# Patient Record
Sex: Female | Born: 1960 | Race: White | Hispanic: No | Marital: Married | Smoking: Former smoker
Health system: Southern US, Community
[De-identification: ages and names within clinical notes are randomized; demographics above are authoritative.]

## PROBLEM LIST (undated history)

## (undated) DIAGNOSIS — H269 Unspecified cataract: Secondary | ICD-10-CM

## (undated) DIAGNOSIS — F419 Anxiety disorder, unspecified: Secondary | ICD-10-CM

## (undated) DIAGNOSIS — R51 Headache: Secondary | ICD-10-CM

## (undated) DIAGNOSIS — G709 Myoneural disorder, unspecified: Secondary | ICD-10-CM

## (undated) DIAGNOSIS — F329 Major depressive disorder, single episode, unspecified: Secondary | ICD-10-CM

## (undated) DIAGNOSIS — E119 Type 2 diabetes mellitus without complications: Secondary | ICD-10-CM

## (undated) DIAGNOSIS — E785 Hyperlipidemia, unspecified: Secondary | ICD-10-CM

## (undated) DIAGNOSIS — B019 Varicella without complication: Secondary | ICD-10-CM

## (undated) DIAGNOSIS — I10 Essential (primary) hypertension: Secondary | ICD-10-CM

## (undated) DIAGNOSIS — M1990 Unspecified osteoarthritis, unspecified site: Secondary | ICD-10-CM

## (undated) DIAGNOSIS — R519 Headache, unspecified: Secondary | ICD-10-CM

## (undated) DIAGNOSIS — F32A Depression, unspecified: Secondary | ICD-10-CM

## (undated) HISTORY — DX: Varicella without complication: B01.9

## (undated) HISTORY — DX: Depression, unspecified: F32.A

## (undated) HISTORY — DX: Myoneural disorder, unspecified: G70.9

## (undated) HISTORY — DX: Type 2 diabetes mellitus without complications: E11.9

## (undated) HISTORY — DX: Headache: R51

## (undated) HISTORY — DX: Headache, unspecified: R51.9

## (undated) HISTORY — DX: Major depressive disorder, single episode, unspecified: F32.9

## (undated) HISTORY — DX: Anxiety disorder, unspecified: F41.9

## (undated) HISTORY — PX: TONSILLECTOMY AND ADENOIDECTOMY: SUR1326

## (undated) HISTORY — DX: Essential (primary) hypertension: I10

## (undated) HISTORY — DX: Unspecified osteoarthritis, unspecified site: M19.90

## (undated) HISTORY — DX: Unspecified cataract: H26.9

## (undated) HISTORY — DX: Hyperlipidemia, unspecified: E78.5

## (undated) HISTORY — PX: TUBAL LIGATION: SHX77

---

## 2011-11-05 HISTORY — PX: ABDOMINAL HYSTERECTOMY: SHX81

## 2011-11-05 HISTORY — PX: BREAST BIOPSY: SHX20

## 2012-04-01 ENCOUNTER — Ambulatory Visit: Payer: Self-pay | Admitting: Unknown Physician Specialty

## 2012-05-06 ENCOUNTER — Ambulatory Visit: Payer: Self-pay | Admitting: Obstetrics & Gynecology

## 2012-05-06 LAB — BASIC METABOLIC PANEL
Anion Gap: 7 (ref 7–16)
Calcium, Total: 9.2 mg/dL (ref 8.5–10.1)
Co2: 27 mmol/L (ref 21–32)
Creatinine: 0.73 mg/dL (ref 0.60–1.30)
EGFR (African American): >60
EGFR (Non-African Amer.): >60
Osmolality: 274 (ref 275–301)
Sodium: 137 mmol/L (ref 136–145)

## 2012-05-06 LAB — CBC
MCH: 28.7 pg (ref 26.0–34.0)
MCHC: 32.7 g/dL (ref 32.0–36.0)
MCV: 88 fL (ref 80–100)
Platelet: 304 10*3/uL (ref 150–440)
RDW: 13.7 % (ref 11.5–14.5)
WBC: 11.7 10*3/uL — ABNORMAL HIGH (ref 3.6–11.0)

## 2012-05-21 ENCOUNTER — Ambulatory Visit: Payer: Self-pay | Admitting: Obstetrics & Gynecology

## 2012-05-21 LAB — HCG, QUANTITATIVE, PREGNANCY: Beta Hcg, Quant.: <1 m[IU]/mL — ABNORMAL LOW

## 2012-05-22 LAB — HEMOGLOBIN: HGB: 12.4 g/dL (ref 12.0–16.0)

## 2012-05-25 LAB — PATHOLOGY REPORT

## 2012-11-26 ENCOUNTER — Ambulatory Visit: Payer: Self-pay | Admitting: General Surgery

## 2015-02-21 NOTE — Op Note (Signed)
PATIENT NAME:  Brittany Pruitt, Brittany Pruitt MR#:  [id number] DATE OF BIRTH:  January 25, 1961  DATE OF PROCEDURE:  05/21/2012  PREOPERATIVE DIAGNOSES:  1. Menorrhagia and pelvic pain.  2. Family history of ovarian cancer.   POSTOPERATIVE DIAGNOSES:    1. Menorrhagia and pelvic pain.  2. Family history of ovarian cancer.   PROCEDURES PERFORMED:  1. Complete laparoscopic hysterectomy. 2. Bilateral salpingo-oophorectomy. 3. Lysis of adhesions.   SURGEON: Glean Salen, M.D.  ASSISTANT: Prentice Docker, M.D.   ANESTHESIA: General.   ESTIMATED BLOOD LOSS: 50 mL.  COMPLICATIONS: None.  FINDINGS: Enlarged fibroid uterus.  DISPOSITION: To recovery room stable.   TECHNIQUE: The patient is prepped and draped in the usual sterile fashion after adequate anesthesia is obtained in the dorsal lithotomy position. Foley catheter is inserted. A V-Care device is placed on the cervix and uterus for manipulation purposes.   Attention is then turned to the abdomen where a Veress needle is inserted through a 5 mm infraumbilical incision after Marcaine is used to anesthetize the skin. Veress needle placement is confirmed using hanging drop technique and the abdomen is insufflated with CO2 gas. A 5 mm long trocar is then placed through the umbilicus under direct visualization with the laparoscope with no injuries or bleeding noted. The patient is placed in Trendelenburg position. The above-mentioned findings are visualized.   An 11 mm trocar is placed through the right lower quadrant and a 5 mm trocar is placed through the left lower quadrant lateral to the inferior epigastric blood vessels with no injuries or bleeding noted. The uterus is grasped with a tenaculum and a 5 mm Harmonic scalpel is utilized to dissect the uterus and adnexa. A bipolar cautery device is used to cauterize the infundibulopelvic blood vessels and their ligaments along with the uterine arteries. Once the dissection is complete to the level of the  external os of the cervix, the V-Care device is seen tenting through the cervical vaginal junction. A circumferential incision is made with the Harmonic scalpel to completely amputate the cervix, uterus and adnexa. The uterus is then removed vaginally and a sponge is placed vaginally to maintain pneumoperitoneum.   The vaginal cuff is closed with a 0 Polysorb suture in an interrupted fashion using the Endo Stitch device. Examination visually and bimanually reveals excellent closure. The pelvic cavity is irrigated with aspiration of all fluid and excellent hemostasis is noted. There is no apparent injury to bowel, bladder, or ureter. The patient is leveled. Gas is expelled. Trocars are removed. The skin is closed with Dermabond and covered with bandages. The patient goes to the recovery room in stable condition having tolerated the procedure well. All sponge, instrument, and needle counts are correct.   ____________________________ R. Barnett Applebaum, MD rph:ap D: 05/21/2012 14:53:38 ET T: 05/21/2012 15:31:26 ET JOB#: [id number]  cc: Glean Salen, MD, <Dictator> Gae Dry MD ELECTRONICALLY SIGNED 05/21/2012 16:53

## 2015-05-25 ENCOUNTER — Ambulatory Visit (INDEPENDENT_AMBULATORY_CARE_PROVIDER_SITE_OTHER): Payer: 59 | Admitting: Internal Medicine

## 2015-05-25 ENCOUNTER — Encounter (INDEPENDENT_AMBULATORY_CARE_PROVIDER_SITE_OTHER): Payer: Self-pay

## 2015-05-25 ENCOUNTER — Encounter: Payer: Self-pay | Admitting: Internal Medicine

## 2015-05-25 VITALS — BP 120/88 | HR 74 | Temp 97.5°F | Ht 65.0 in | Wt 188.0 lb

## 2015-05-25 DIAGNOSIS — E1165 Type 2 diabetes mellitus with hyperglycemia: Secondary | ICD-10-CM | POA: Insufficient documentation

## 2015-05-25 DIAGNOSIS — Z23 Encounter for immunization: Secondary | ICD-10-CM | POA: Diagnosis not present

## 2015-05-25 DIAGNOSIS — F329 Major depressive disorder, single episode, unspecified: Secondary | ICD-10-CM | POA: Insufficient documentation

## 2015-05-25 DIAGNOSIS — F418 Other specified anxiety disorders: Secondary | ICD-10-CM | POA: Diagnosis not present

## 2015-05-25 DIAGNOSIS — F419 Anxiety disorder, unspecified: Secondary | ICD-10-CM

## 2015-05-25 DIAGNOSIS — J383 Other diseases of vocal cords: Secondary | ICD-10-CM

## 2015-05-25 DIAGNOSIS — E119 Type 2 diabetes mellitus without complications: Secondary | ICD-10-CM | POA: Insufficient documentation

## 2015-05-25 DIAGNOSIS — F32A Depression, unspecified: Secondary | ICD-10-CM

## 2015-05-25 NOTE — Addendum Note (Signed)
Addended by: Lurlean Nanny on: 05/25/2015 01:40 PM   Modules accepted: Orders

## 2015-05-25 NOTE — Assessment & Plan Note (Signed)
Will check A1C today No microalbumin while she is on ACEI Discussed consuming a low carb diet and working on exercise Encouraged her to get a eye exam Foot exam today Flu UTD Prevnar today

## 2015-05-25 NOTE — Assessment & Plan Note (Signed)
She is not interested in seeing speech therapy again at this time She will continue Clonazepam to help relax the area

## 2015-05-25 NOTE — Assessment & Plan Note (Signed)
Anxiety controlled with daily Clonazepam No reoccurrence of depression

## 2015-05-25 NOTE — Progress Notes (Signed)
HPI  Pt presents to the clinic today to establish care and for management of the conditions listed below. She is transferring care from Dr. Dear at [facility].  DM 2: Fasting blood sugars 230-250. Non fasting can be as high as 350. She does try to consume a low carb diet. She reports she has lost 60 lbs in the last 5 years. She dies not exercise. Her last A1C was checked 6 months ago and it was greater than 10%. She is taking Glipizide and Januvia. She has taken Metformin in the past but had explosive diarrhea. She is taking Lisinopril for renal protection but has never been told she had high blood pressure. Her last eye exam was > 2 years ago. Flu 08/2014. Pneumovax never. Prevnar never.  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 She has tried Botox with a specialist at [facility]. They did not seem to work. She takes the Clonazepam once daily to help with this. She reports that she also has some anxiety which it helps with. She has had depression in the past but no reoccurrence. She denies SI/HI.  Flu: 08/2014 Tetanus: > 10 years ago Prevnar: never Pneumovax: never Mammogram: 2015 at [place] Pap Smear: 2013, total hysterectomy Colon Screening: never Vision Screening: as needed Dentist: as needed  Past Medical History  Diagnosis Date  . Chicken pox   . Depression   . Diabetes mellitus without complication   . Frequent headaches     Current Outpatient Prescriptions  Medication Sig Dispense Refill  . clonazePAM (KLONOPIN) 0.5 MG tablet Take 0.25-0.5 mg by mouth 2 (two) times daily as needed.     Marland Kitchen glipiZIDE (GLUCOTROL XL) 10 MG 24 hr tablet Take 20 mg by mouth daily with breakfast.     . lisinopril (PRINIVIL,ZESTRIL) 5 MG tablet Take 5 mg by mouth daily.     . sitaGLIPtin (JANUVIA) 100 MG tablet Take 100 mg by mouth daily.     No current facility-administered medications for this visit.    Allergies  Allergen Reactions  . Sulfa Antibiotics Hives     Family History  Problem Relation Age of Onset  . Uterine cancer Mother   . Breast cancer Mother   . Pancreatic cancer Mother   . Heart disease Father   . Hypertension Father   . Anxiety disorder Father   . Uterine cancer Maternal Aunt   . Breast cancer Maternal Aunt   . Heart disease Paternal Grandfather     History   Social History  . Marital Status: Married    Spouse Name: N/A  . Number of Children: N/A  . Years of Education: N/A   Occupational History  . Not on file.   Social History Main Topics  . Smoking status: Current Every Day Smoker -- 0.25 packs/day    Types: Cigarettes  . Smokeless tobacco: Not on file  . Alcohol Use: No  . Drug Use: No  . Sexual Activity: Not on file   Other Topics Concern  . Not on file   Social History Narrative  . No narrative on file    ROS:  Constitutional: Denies fever, malaise, fatigue, headache or abrupt weight changes.  HEENT: Pt reports blurred vision. Denies eye pain, eye redness, ear pain, ringing in the ears, wax buildup, runny nose, nasal congestion, bloody nose, or sore throat. Respiratory: Denies difficulty breathing, shortness of breath, cough or sputum production.   Cardiovascular: Denies chest pain, chest tightness, palpitations or swelling in the hands  or feet.  Gastrointestinal: Denies abdominal pain, bloating, constipation, diarrhea or blood in the stool.  GU: Denies frequency, urgency, pain with urination, blood in urine, odor or discharge. Musculoskeletal: Denies decrease in range of motion, difficulty with gait, muscle pain or joint pain and swelling.  Skin: Denies redness, rashes, lesions or ulcercations.  Neurological: Denies dizziness, difficulty with memory, difficulty with speech or problems with balance and coordination.  Psych: Pt reports anxiety. Denies depression, SI/HI.  No other specific complaints in a complete review of systems (except as listed in HPI above).  PE:  BP 120/88 mmHg  Pulse  74  Temp(Src) 97.5 F (36.4 C) (Oral)  Ht 5\' 5"  (1.651 m)  Wt 188 lb (85.276 kg)  BMI 31.28 kg/m2  SpO2 97% Wt Readings from Last 3 Encounters:  05/25/15 188 lb (85.276 kg)    General: Appears her stated age, obese in NAD. HEENT: Head: normal shape and size; Eyes: sclera white, no icterus, conjunctiva pink, PERRLA and EOMs intact; Voice notably strained. Cardiovascular: Normal rate and rhythm. S1,S2 noted.  No murmur, rubs or gallops noted. No JVD or BLE edema. No carotid bruits noted. Pulmonary/Chest: Normal effort and positive vesicular breath sounds. No respiratory distress. No wheezes, rales or ronchi noted.  Abdomen: Soft and nontender. Normal bowel sounds, no bruits noted. No distention or masses noted. Liver, spleen and kidneys non palpable. Neurological: Alert and oriented. Sensation intact to BLE. Psychiatric: Mood and affect normal. Behavior is normal. Judgment and thought content normal.    BMET    Component Value Date/Time   NA 137 05/06/2012 0949   K 4.5 05/06/2012 0949   CL 103 05/06/2012 0949   CO2 27 05/06/2012 0949   GLUCOSE 123* 05/06/2012 0949   BUN 10 05/06/2012 0949   CREATININE 0.73 05/06/2012 0949   CALCIUM 9.2 05/06/2012 0949   GFRNONAA >60 05/06/2012 0949   GFRAA >60 05/06/2012 0949    Lipid Panel  No results found for: CHOL, TRIG, HDL, CHOLHDL, VLDL, LDLCALC  CBC    Component Value Date/Time   WBC 11.7* 05/06/2012 0949   RBC 5.01 05/06/2012 0949   HGB 12.4 05/22/2012 0603   HCT 44.0 05/06/2012 0949   PLT 304 05/06/2012 0949   MCV 88 05/06/2012 0949   MCH 28.7 05/06/2012 0949   MCHC 32.7 05/06/2012 0949   RDW 13.7 05/06/2012 0949    Hgb A1C No results found for: HGBA1C   Assessment and Plan:

## 2015-05-25 NOTE — Progress Notes (Signed)
Pre visit review using our clinic review tool, if applicable. No additional management support is needed unless otherwise documented below in the visit note. 

## 2015-05-25 NOTE — Patient Instructions (Signed)

## 2015-05-26 ENCOUNTER — Other Ambulatory Visit: Payer: Self-pay

## 2015-05-26 LAB — COMPREHENSIVE METABOLIC PANEL
ALT: 17 IU/L (ref 0–32)
AST: 12 IU/L (ref 0–40)
Albumin/Globulin Ratio: 1.6 (ref 1.1–2.5)
Albumin: 4.3 g/dL (ref 3.5–5.5)
Alkaline Phosphatase: 67 IU/L (ref 39–117)
BILIRUBIN TOTAL: 0.3 mg/dL (ref 0.0–1.2)
BUN/Creatinine Ratio: 15 (ref 9–23)
BUN: 8 mg/dL (ref 6–24)
CALCIUM: 9.8 mg/dL (ref 8.7–10.2)
CO2: 24 mmol/L (ref 18–29)
Chloride: 94 mmol/L — ABNORMAL LOW (ref 97–108)
Creatinine, Ser: 0.54 mg/dL — ABNORMAL LOW (ref 0.57–1.00)
GFR, EST AFRICAN AMERICAN: 123 mL/min/{1.73_m2} (ref >59)
GFR, EST NON AFRICAN AMERICAN: 107 mL/min/{1.73_m2} (ref >59)
GLOBULIN, TOTAL: 2.7 g/dL (ref 1.5–4.5)
GLUCOSE: 236 mg/dL — AB (ref 65–99)
POTASSIUM: 4.5 mmol/L (ref 3.5–5.2)
Sodium: 137 mmol/L (ref 134–144)
Total Protein: 7 g/dL (ref 6.0–8.5)

## 2015-05-26 LAB — LIPID PANEL
CHOLESTEROL TOTAL: 220 mg/dL — AB (ref 100–199)
Chol/HDL Ratio: 6.3 ratio units — ABNORMAL HIGH (ref 0.0–4.4)
HDL: 35 mg/dL — AB (ref >39)
Triglycerides: 416 mg/dL — ABNORMAL HIGH (ref 0–149)

## 2015-05-26 LAB — CBC WITH DIFFERENTIAL/PLATELET
BASOS ABS: 0 10*3/uL (ref 0.0–0.2)
BASOS: 0 %
EOS (ABSOLUTE): 0.1 10*3/uL (ref 0.0–0.4)
EOS: 1 %
HEMATOCRIT: 46.3 % (ref 34.0–46.6)
Hemoglobin: 15.7 g/dL (ref 11.1–15.9)
Immature Grans (Abs): 0 10*3/uL (ref 0.0–0.1)
Immature Granulocytes: 0 %
Lymphocytes Absolute: 4.2 10*3/uL — ABNORMAL HIGH (ref 0.7–3.1)
Lymphs: 34 %
MCH: 29.1 pg (ref 26.6–33.0)
MCHC: 33.9 g/dL (ref 31.5–35.7)
MCV: 86 fL (ref 79–97)
MONOS ABS: 0.8 10*3/uL (ref 0.1–0.9)
Monocytes: 6 %
NEUTROS ABS: 7.3 10*3/uL — AB (ref 1.4–7.0)
Neutrophils: 59 %
Platelets: 292 10*3/uL (ref 150–379)
RBC: 5.4 x10E6/uL — ABNORMAL HIGH (ref 3.77–5.28)
RDW: 14.3 % (ref 12.3–15.4)
WBC: 12.5 10*3/uL — AB (ref 3.4–10.8)

## 2015-05-26 LAB — HEMOGLOBIN A1C
ESTIMATED AVERAGE GLUCOSE: 298 mg/dL
Hgb A1c MFr Bld: 12 % — ABNORMAL HIGH (ref 4.8–5.6)

## 2015-05-26 MED ORDER — GLIPIZIDE ER 10 MG PO TB24: 20.0000 mg | ORAL_TABLET | Freq: Every day | ORAL | Status: DC

## 2015-05-26 MED ORDER — PEN NEEDLES 31G X 6 MM MISC: 1.0000 | Freq: Every day | Status: DC

## 2015-05-26 MED ORDER — SITAGLIPTIN PHOSPHATE 100 MG PO TABS: 100.0000 mg | ORAL_TABLET | Freq: Every day | ORAL | Status: DC

## 2015-05-26 MED ORDER — SIMVASTATIN 20 MG PO TABS: 20.0000 mg | ORAL_TABLET | Freq: Every day | ORAL | Status: DC

## 2015-05-26 NOTE — Addendum Note (Signed)
Addended by: Lurlean Nanny on: 05/26/2015 05:12 PM   Modules accepted: Orders

## 2015-06-07 MED ORDER — INSULIN DETEMIR 100 UNIT/ML FLEXPEN: 20.0000 [IU] | PEN_INJECTOR | Freq: Every day | SUBCUTANEOUS | Status: DC

## 2015-06-07 MED ORDER — SIMVASTATIN 20 MG PO TABS: 20.0000 mg | ORAL_TABLET | Freq: Every day | ORAL | Status: DC

## 2015-06-07 MED ORDER — PEN NEEDLES 31G X 6 MM MISC: 1.0000 | Freq: Every day | Status: DC

## 2015-06-07 NOTE — Addendum Note (Signed)
Addended by: Lurlean Nanny on: 06/07/2015 04:13 PM   Modules accepted: Orders

## 2015-06-09 MED ORDER — SIMVASTATIN 20 MG PO TABS: 20.0000 mg | ORAL_TABLET | Freq: Every day | ORAL | Status: DC

## 2015-06-09 MED ORDER — INSULIN DETEMIR 100 UNIT/ML FLEXPEN: 20.0000 [IU] | PEN_INJECTOR | Freq: Every day | SUBCUTANEOUS | Status: DC

## 2015-06-09 NOTE — Addendum Note (Signed)
Addended by: Lurlean Nanny on: 06/09/2015 08:34 AM   Modules accepted: Orders

## 2015-06-19 ENCOUNTER — Ambulatory Visit: Payer: Self-pay | Admitting: Family Medicine

## 2015-08-18 ENCOUNTER — Other Ambulatory Visit: Payer: Self-pay | Admitting: Internal Medicine

## 2015-08-18 DIAGNOSIS — Z0000 Encounter for general adult medical examination without abnormal findings: Secondary | ICD-10-CM

## 2015-08-23 ENCOUNTER — Other Ambulatory Visit (INDEPENDENT_AMBULATORY_CARE_PROVIDER_SITE_OTHER): Payer: 59

## 2015-08-23 DIAGNOSIS — Z0000 Encounter for general adult medical examination without abnormal findings: Secondary | ICD-10-CM

## 2015-08-24 LAB — CBC
Hematocrit: 46.6 % (ref 34.0–46.6)
Hemoglobin: 15.6 g/dL (ref 11.1–15.9)
MCH: 28.9 pg (ref 26.6–33.0)
MCHC: 33.5 g/dL (ref 31.5–35.7)
MCV: 87 fL (ref 79–97)
PLATELETS: 283 10*3/uL (ref 150–379)
RBC: 5.39 x10E6/uL — AB (ref 3.77–5.28)
RDW: 13.8 % (ref 12.3–15.4)
WBC: 12.1 10*3/uL — AB (ref 3.4–10.8)

## 2015-08-24 LAB — COMPREHENSIVE METABOLIC PANEL
ALK PHOS: 62 IU/L (ref 39–117)
ALT: 15 IU/L (ref 0–32)
AST: 15 IU/L (ref 0–40)
Albumin/Globulin Ratio: 1.8 (ref 1.1–2.5)
Albumin: 4.2 g/dL (ref 3.5–5.5)
BILIRUBIN TOTAL: 0.4 mg/dL (ref 0.0–1.2)
BUN/Creatinine Ratio: 18 (ref 9–23)
BUN: 8 mg/dL (ref 6–24)
CHLORIDE: 96 mmol/L — AB (ref 97–106)
CO2: 24 mmol/L (ref 18–29)
Calcium: 9.8 mg/dL (ref 8.7–10.2)
Creatinine, Ser: 0.44 mg/dL — ABNORMAL LOW (ref 0.57–1.00)
GFR calc Af Amer: 131 mL/min/{1.73_m2} (ref >59)
GFR calc non Af Amer: 114 mL/min/{1.73_m2} (ref >59)
GLUCOSE: 246 mg/dL — AB (ref 65–99)
Globulin, Total: 2.4 g/dL (ref 1.5–4.5)
Potassium: 4.8 mmol/L (ref 3.5–5.2)
Sodium: 136 mmol/L (ref 136–144)
Total Protein: 6.6 g/dL (ref 6.0–8.5)

## 2015-08-24 LAB — LIPID PANEL
CHOL/HDL RATIO: 4.5 ratio — AB (ref 0.0–4.4)
Cholesterol, Total: 130 mg/dL (ref 100–199)
HDL: 29 mg/dL — ABNORMAL LOW (ref >39)
LDL CALC: 52 mg/dL (ref 0–99)
Triglycerides: 247 mg/dL — ABNORMAL HIGH (ref 0–149)
VLDL Cholesterol Cal: 49 mg/dL — ABNORMAL HIGH (ref 5–40)

## 2015-08-24 LAB — HEMOGLOBIN A1C
ESTIMATED AVERAGE GLUCOSE: 278 mg/dL
HEMOGLOBIN A1C: 11.3 % — AB (ref 4.8–5.6)

## 2015-08-25 MED ORDER — INSULIN DETEMIR 100 UNIT/ML FLEXPEN: 28.0000 [IU] | PEN_INJECTOR | Freq: Every day | SUBCUTANEOUS | Status: DC

## 2015-08-25 NOTE — Addendum Note (Signed)
Addended by: Lurlean Nanny on: 08/25/2015 08:17 AM   Modules accepted: Orders

## 2015-08-29 ENCOUNTER — Telehealth: Payer: Self-pay | Admitting: Internal Medicine

## 2015-08-29 ENCOUNTER — Encounter: Payer: 59 | Admitting: Internal Medicine

## 2015-08-29 NOTE — Telephone Encounter (Signed)
Pt insurance company will not be covering the insulin she is taking now in 2017. [employer] forms are in RX tower in [place] slot.   Also glucose level record attached also.   Please call pt to discuss after review.

## 2015-08-30 MED ORDER — INSULIN GLARGINE 100 UNIT/ML SOLOSTAR PEN: 30.0000 [IU] | PEN_INJECTOR | Freq: Every day | SUBCUTANEOUS | Status: DC

## 2015-08-30 NOTE — Telephone Encounter (Signed)
Left detailed msg on VM per HIPAA D/C levemir and changed to lantus 30 units at bedtime---Rx changed in chart and refilled  BG readings sent to be scanned

## 2015-09-04 ENCOUNTER — Telehealth: Payer: Self-pay

## 2015-09-04 NOTE — Telephone Encounter (Signed)
[employer] rx left v/m requesting name of supervising physician for Avie Echevaria NP; ref # [id number]. Spoke with Zielke Malta and gave Dr Arnette Norris as supervising physician.

## 2015-09-08 ENCOUNTER — Encounter: Payer: Self-pay | Admitting: Internal Medicine

## 2015-09-11 MED ORDER — PEN NEEDLES 31G X 6 MM MISC
1.0000 | Freq: Two times a day (BID) | Status: DC
Start: 2015-09-11 — End: 2017-03-04

## 2015-10-16 ENCOUNTER — Other Ambulatory Visit: Payer: Self-pay | Admitting: Family Medicine

## 2015-11-15 ENCOUNTER — Encounter: Payer: Self-pay | Admitting: Internal Medicine

## 2015-11-15 ENCOUNTER — Ambulatory Visit (INDEPENDENT_AMBULATORY_CARE_PROVIDER_SITE_OTHER): Payer: 59 | Admitting: Internal Medicine

## 2015-11-15 VITALS — BP 124/64 | HR 77 | Temp 98.1°F | Ht 65.0 in | Wt 183.0 lb

## 2015-11-15 DIAGNOSIS — Z1239 Encounter for other screening for malignant neoplasm of breast: Secondary | ICD-10-CM

## 2015-11-15 DIAGNOSIS — E1165 Type 2 diabetes mellitus with hyperglycemia: Secondary | ICD-10-CM

## 2015-11-15 DIAGNOSIS — E782 Mixed hyperlipidemia: Secondary | ICD-10-CM | POA: Insufficient documentation

## 2015-11-15 DIAGNOSIS — Z0000 Encounter for general adult medical examination without abnormal findings: Secondary | ICD-10-CM

## 2015-11-15 DIAGNOSIS — Z1211 Encounter for screening for malignant neoplasm of colon: Secondary | ICD-10-CM | POA: Diagnosis not present

## 2015-11-15 DIAGNOSIS — E1169 Type 2 diabetes mellitus with other specified complication: Secondary | ICD-10-CM | POA: Insufficient documentation

## 2015-11-15 DIAGNOSIS — E785 Hyperlipidemia, unspecified: Secondary | ICD-10-CM | POA: Insufficient documentation

## 2015-11-15 DIAGNOSIS — J383 Other diseases of vocal cords: Secondary | ICD-10-CM

## 2015-11-15 DIAGNOSIS — F411 Generalized anxiety disorder: Secondary | ICD-10-CM

## 2015-11-15 DIAGNOSIS — Z794 Long term (current) use of insulin: Secondary | ICD-10-CM

## 2015-11-15 NOTE — Progress Notes (Signed)
Pre visit review using our clinic review tool, if applicable. No additional management support is needed unless otherwise documented below in the visit note. 

## 2015-11-15 NOTE — Patient Instructions (Signed)

## 2015-11-15 NOTE — Assessment & Plan Note (Signed)
Continue Clonazepam BID Refilled  today

## 2015-11-15 NOTE — Assessment & Plan Note (Signed)
Uncontrolled A1C today No micoralbumin because she is on Lisinopril Flu and Prevnar UTD Pneumovax due 05/2016 Encouraged her to make an appt for an eye exam Foot exam today Continue Metformin, Januvia and Lantus

## 2015-11-15 NOTE — Assessment & Plan Note (Signed)
Will check Lipid Profile otday Continue Zocor Encouraged her to consume a low fat diet

## 2015-11-15 NOTE — Assessment & Plan Note (Signed)
Stable on her current dose of Clonazepam Consider SSRI therapy

## 2015-11-15 NOTE — Progress Notes (Signed)
Subjective:    Patient ID: Brittany Pruitt, female    DOB: [birth year], 55 y.o.   MRN: [id number]  HPI  Pt presents to the clinic today for her annual exam. She is also due to follow up chronic conditions, see separate note.  Flu:09/2015 Tetanus: > 10 years ago Prevnar: 05/2015 Pap Smear: 2013, has had total hysterectomy Mammogram: 2015, will need to schedule Colon Screening: never Vision Screening: as needed. > 2 years ago Dentist: as needed. ?> 2 years ago  Diet: She does not consume a lot of meat. She eats veggies daily and fruits a few times per week. She tries to avoid carbs and fried foods. She drinks mostly water. Exercise: None  Review of Systems  Past Medical History  Diagnosis Date  . Chicken pox   . Depression   . Diabetes mellitus without complication ([place])   . Frequent headaches     Current Outpatient Prescriptions  Medication Sig Dispense Refill  . clonazePAM (KLONOPIN) 0.5 MG tablet Take 0.25-0.5 mg by mouth 2 (two) times daily as needed.     Marland Kitchen glipiZIDE (GLUCOTROL XL) 10 MG 24 hr tablet Take 2 tablets (20 mg total) by mouth daily with breakfast. 180 tablet 1  . Insulin Glargine (LANTUS SOLOSTAR) 100 UNIT/ML Solostar Pen Inject 30 Units into the skin daily at 10 pm. 12 pen 3  . Insulin Pen Needle (PEN NEEDLES) 31G X 6 MM MISC 1 each by Does not apply route 2 (two) times daily. 200 each 4  . lisinopril (PRINIVIL,ZESTRIL) 5 MG tablet Take 5 mg by mouth daily.     . simvastatin (ZOCOR) 20 MG tablet Take 1 tablet by mouth at  bedtime 90 tablet 1  . sitaGLIPtin (JANUVIA) 100 MG tablet Take 1 tablet (100 mg total) by mouth daily. 90 tablet 1   No current facility-administered medications for this visit.    Allergies  Allergen Reactions  . Sulfa Antibiotics Hives    Family History  Problem Relation Age of Onset  . Uterine cancer Mother   . Breast cancer Mother   . Pancreatic cancer Mother   . Heart disease Father   . Hypertension Father   . Anxiety  disorder Father   . Diabetes Father   . Uterine cancer Maternal Aunt   . Breast cancer Maternal Aunt   . Heart disease Paternal Grandfather     Social History   Social History  . Marital Status: Married    Spouse Name: N/A  . Number of Children: N/A  . Years of Education: N/A   Occupational History  . Not on file.   Social History Main Topics  . Smoking status: Current Every Day Smoker -- 0.25 packs/day for 30 years    Types: Cigarettes  . Smokeless tobacco: Not on file  . Alcohol Use: No  . Drug Use: No  . Sexual Activity: No   Other Topics Concern  . Not on file   Social History Narrative     Constitutional: Denies fever, malaise, fatigue, headache or abrupt weight changes.  HEENT: Denies eye pain, eye redness, ear pain, ringing in the ears, wax buildup, runny nose, nasal congestion, bloody nose, or sore throat. Respiratory: Denies difficulty breathing, shortness of breath, cough or sputum production.   Cardiovascular: Denies chest pain, chest tightness, palpitations or swelling in the hands or feet.  Gastrointestinal: Denies abdominal pain, bloating, constipation, diarrhea or blood in the stool.  GU: Denies urgency, frequency, pain with urination, burning sensation, blood in urine,  odor or discharge. Musculoskeletal: Pt reports muscle cramps in legs. Denies decrease in range of motion, difficulty with gait, or joint pain and swelling.  Skin: Pt reports lesion in right lower eye lid. Denies redness, rashes or ulcercations.  Neurological: Pt reports difficulty with speech. Denies dizziness, difficulty with memory, problems with balance and coordination.  Psych: Pt reports history of anxiety. Denies depression, SI/HI.  No other specific complaints in a complete review of systems (except as listed in HPI above).     Objective:   Physical Exam BP 124/64 mmHg  Pulse 77  Temp(Src) 98.1 F (36.7 C) (Oral)  Ht 5\' 5"  (1.651 m)  Wt 183 lb (83.008 kg)  BMI 30.45 kg/m2   SpO2 98% Wt Readings from Last 3 Encounters:  11/15/15 183 lb (83.008 kg)  05/25/15 188 lb (85.276 kg)    General: Appears her stated age, obese in NAD. Skin: Warm, dry and intact. Small, oval, yellow deposit in lower conjunctiva. HEENT: Head: normal shape and size; Eyes: sclera white, no icterus, conjunctiva pink, PERRLA and EOMs intact; Ears: Tm's gray and intact, normal light reflex; Throat/Mouth: Teeth present, mucosa pink and moist, no exudate, lesions or ulcerations noted.  Neck:  Neck supple, trachea midline. No masses, lumps or thyromegaly present.  Cardiovascular: Normal rate and rhythm. S1,S2 noted.  No murmur, rubs or gallops noted. No JVD or BLE edema. No carotid bruits noted. Pulmonary/Chest: Normal effort and positive vesicular breath sounds. No respiratory distress. No wheezes, rales or ronchi noted.  Abdomen: Soft and nontender. Normal bowel sounds. No distention or masses noted. Liver, spleen and kidneys non palpable. Musculoskeletal: Strength 5/5 BUE/BLE. No signs of joint swelling. No difficulty with gait.  Neurological: Alert and oriented. Cranial nerves II-XII grossly intact. Coordination normal.  Psychiatric: Mood and affect normal. Behavior is normal. Judgment and thought content normal.    BMET    Component Value Date/Time   NA 136 08/23/2015 0838   NA 137 05/06/2012 0949   K 4.8 08/23/2015 0838   K 4.5 05/06/2012 0949   CL 96* 08/23/2015 0838   CL 103 05/06/2012 0949   CO2 24 08/23/2015 0838   CO2 27 05/06/2012 0949   GLUCOSE 246* 08/23/2015 0838   GLUCOSE 123* 05/06/2012 0949   BUN 8 08/23/2015 0838   BUN 10 05/06/2012 0949   CREATININE 0.44* 08/23/2015 0838   CREATININE 0.73 05/06/2012 0949   CALCIUM 9.8 08/23/2015 0838   CALCIUM 9.2 05/06/2012 0949   GFRNONAA 114 08/23/2015 0838   GFRNONAA >60 05/06/2012 0949   GFRAA 131 08/23/2015 0838   GFRAA >60 05/06/2012 0949    Lipid Panel     Component Value Date/Time   CHOL 130 08/23/2015 0838   TRIG  247* 08/23/2015 0838   HDL 29* 08/23/2015 0838   CHOLHDL 4.5* 08/23/2015 0838   LDLCALC 52 08/23/2015 0838    CBC    Component Value Date/Time   WBC 12.1* 08/23/2015 0838   WBC 11.7* 05/06/2012 0949   RBC 5.39* 08/23/2015 0838   RBC 5.01 05/06/2012 0949   HGB 12.4 05/22/2012 0603   HCT 46.6 08/23/2015 0838   HCT 44.0 05/06/2012 0949   PLT 283 08/23/2015 0838   PLT 304 05/06/2012 0949   MCV 87 08/23/2015 0838   MCV 88 05/06/2012 0949   MCH 28.9 08/23/2015 0838   MCH 28.7 05/06/2012 0949   MCHC 33.5 08/23/2015 0838   MCHC 32.7 05/06/2012 0949   RDW 13.8 08/23/2015 0838   RDW 13.7 05/06/2012  0949   LYMPHSABS 4.2* 05/25/2015 1102   EOSABS 0.1 05/25/2015 1102   BASOSABS 0.0 05/25/2015 1102    Hgb A1C Lab Results  Component Value Date   HGBA1C 11.3* 08/23/2015          Assessment & Plan:   Preventative Health Maintenance:  Flu shot UTD She declines Tetanus vaccine Prevnar UTD Will need Pneumovax 05/2015 Referral place to GI for screening colonoscopy Mammogram ordered she will call [facility] to schedule Encouraged her to see an eye doctor and dentist annually Discussed smoking cessation- she is not interested in quitting at this time  RTC in 3 months to follow up chronic conditions  HPI  Pt presents to the clinic today to follow up chronic conditions  DM 2: Fasting blood sugars 100-220. She does try to consume a low carb diet.  She does not exercise. Her last A1C was 11.3%. She is taking Glipizide, Januvia and Lantus (30 units). She has taken Metformin in the past but had explosive diarrhea. She is taking Lisinopril for renal protection but has never been told she had high blood pressure. Her last eye exam was > 2 years ago. Flu 09/2015. Pneumovax never. Prevnar 05/2015.  HLD: Her last LDL was 52. She is on Zocor. She does report some muscle cramping in her legs but nothing consistent. She does not take a baby ASA daily. She does try to consume a low fat  diet.  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 She has tried Botox with a specialist at [facility]. They did not seem to work. She takes the Clonazepam twice daily to help with this. She reports that she also has some anxiety which it helps with. She has had depression in the past but no reoccurrence. She denies SI/HI.     Past Medical History   Diagnosis  Date   .  Chicken pox     .  Depression     .  Diabetes mellitus without complication     .  Frequent headaches         Current Outpatient Prescriptions   Medication  Sig  Dispense  Refill   .  clonazePAM (KLONOPIN) 0.5 MG tablet  Take 0.25-0.5 mg by mouth 2 (two) times daily as needed.        Marland Kitchen  glipiZIDE (GLUCOTROL XL) 10 MG 24 hr tablet  Take 20 mg by mouth daily with breakfast.        .  lisinopril (PRINIVIL,ZESTRIL) 5 MG tablet  Take 5 mg by mouth daily.        .  sitaGLIPtin (JANUVIA) 100 MG tablet  Take 100 mg by mouth daily.          No current facility-administered medications for this visit.       Allergies   Allergen  Reactions   .  Sulfa Antibiotics  Hives       Family History   Problem  Relation  Age of Onset   .  Uterine cancer  Mother     .  Breast cancer  Mother     .  Pancreatic cancer  Mother     .  Heart disease  Father     .  Hypertension  Father     .  Anxiety disorder  Father     .  Uterine cancer  Maternal Aunt     .  Breast cancer  Maternal Aunt     .  Heart disease  Paternal  Grandfather         History      Social History   .  Marital Status:  Married       Spouse Name:  N/A   .  Number of Children:  N/A   .  Years of Education:  N/A      Occupational History   .  Not on file.      Social History Main Topics   .  Smoking status:  Current Every Day Smoker -- 0.25 packs/day       Types:  Cigarettes   .  Smokeless tobacco:  Not on file   .  Alcohol Use:  No   .  Drug Use:  No   .  Sexual Activity:  Not on file      Other Topics  Concern   .  Not on file      Social  History Narrative   .  No narrative on file     ROS:  Constitutional: Denies fever, malaise, fatigue, headache or abrupt weight changes.  HEENT: Denies eye pain, eye redness, ear pain, ringing in the ears, wax buildup, runny nose, nasal congestion, bloody nose, or sore throat. Respiratory: Denies difficulty breathing, shortness of breath, cough or sputum production.   Cardiovascular: Denies chest pain, chest tightness, palpitations or swelling in the hands or feet.  Gastrointestinal: Denies abdominal pain, bloating, constipation, diarrhea or blood in the stool.  GU: Denies frequency, urgency, pain with urination, blood in urine, odor or discharge. Musculoskeletal: Pt reports muscle cramps. Denies decrease in range of motion, difficulty with gait, muscle pain or joint pain and swelling.  Skin: Pt reports lesion on her left lower lid. Denies redness, rashes, lesions or ulcercations.  Neurological: Pt reports difficulty with speech. Denies dizziness, difficulty with memory,or problems with balance and coordination.  Psych: Pt reports anxiety. Denies depression, SI/HI.  No other specific complaints in a complete review of systems (except as listed in HPI above).  PE:  BP 120/88 mmHg  Pulse 74  Temp(Src) 97.5 F (36.4 C) (Oral)  Ht 5\' 5"  (1.651 m)  Wt 188 lb (85.276 kg)  BMI 31.28 kg/m2  SpO2 97% Wt Readings from Last 3 Encounters:   05/25/15  188 lb (85.276 kg)     General: Appears her stated age, obese in NAD. Skin: Warm, dry and intact. Small, oval, yellow deposit in lower conjunctiva. HEENT: Head: normal shape and size; Eyes: sclera white, no icterus, conjunctiva pink, PERRLA and EOMs intact; Voice notably strained.  Cardiovascular: Normal rate and rhythm. S1,S2 noted.  No murmur, rubs or gallops noted.  Pulmonary/Chest: Normal effort and positive vesicular breath sounds. No respiratory distress. No wheezes, rales or ronchi noted.   Neurological: Alert and oriented.    Psychiatric: Mood and affect normal. Behavior is normal. Judgment and thought content normal.    Assessment and Plan:

## 2015-11-16 LAB — COMPREHENSIVE METABOLIC PANEL
A/G RATIO: 1.8 (ref 1.1–2.5)
ALT: 16 IU/L (ref 0–32)
AST: 15 IU/L (ref 0–40)
Albumin: 4.2 g/dL (ref 3.5–5.5)
Alkaline Phosphatase: 58 IU/L (ref 39–117)
BUN/Creatinine Ratio: 16 (ref 9–23)
BUN: 9 mg/dL (ref 6–24)
Bilirubin Total: 0.3 mg/dL (ref 0.0–1.2)
CALCIUM: 9.7 mg/dL (ref 8.7–10.2)
CO2: 24 mmol/L (ref 18–29)
CREATININE: 0.58 mg/dL (ref 0.57–1.00)
Chloride: 97 mmol/L (ref 96–106)
GFR, EST AFRICAN AMERICAN: 119 mL/min/{1.73_m2} (ref >59)
GFR, EST NON AFRICAN AMERICAN: 103 mL/min/{1.73_m2} (ref >59)
Globulin, Total: 2.4 g/dL (ref 1.5–4.5)
Glucose: 235 mg/dL — ABNORMAL HIGH (ref 65–99)
POTASSIUM: 4.3 mmol/L (ref 3.5–5.2)
Sodium: 138 mmol/L (ref 134–144)
TOTAL PROTEIN: 6.6 g/dL (ref 6.0–8.5)

## 2015-11-16 LAB — CBC
HEMATOCRIT: 46.6 % (ref 34.0–46.6)
Hemoglobin: 15.6 g/dL (ref 11.1–15.9)
MCH: 29.5 pg (ref 26.6–33.0)
MCHC: 33.5 g/dL (ref 31.5–35.7)
MCV: 88 fL (ref 79–97)
PLATELETS: 270 10*3/uL (ref 150–379)
RBC: 5.29 x10E6/uL — AB (ref 3.77–5.28)
RDW: 13.8 % (ref 12.3–15.4)
WBC: 11.3 10*3/uL — AB (ref 3.4–10.8)

## 2015-11-16 LAB — LIPID PANEL
CHOL/HDL RATIO: 4.7 ratio — AB (ref 0.0–4.4)
Cholesterol, Total: 140 mg/dL (ref 100–199)
HDL: 30 mg/dL — AB (ref >39)
LDL CALC: 58 mg/dL (ref 0–99)
TRIGLYCERIDES: 261 mg/dL — AB (ref 0–149)
VLDL Cholesterol Cal: 52 mg/dL — ABNORMAL HIGH (ref 5–40)

## 2015-11-16 LAB — TSH: TSH: 0.936 u[IU]/mL (ref 0.450–4.500)

## 2015-11-16 LAB — HEMOGLOBIN A1C
Est. average glucose Bld gHb Est-mCnc: 269 mg/dL
HEMOGLOBIN A1C: 11 % — AB (ref 4.8–5.6)

## 2015-11-16 LAB — VITAMIN D 25 HYDROXY (VIT D DEFICIENCY, FRACTURES): Vit D, 25-Hydroxy: 26.2 ng/mL — ABNORMAL LOW (ref 30.0–100.0)

## 2015-11-17 ENCOUNTER — Other Ambulatory Visit: Payer: Self-pay | Admitting: Internal Medicine

## 2015-11-17 DIAGNOSIS — E1165 Type 2 diabetes mellitus with hyperglycemia: Secondary | ICD-10-CM

## 2015-11-17 DIAGNOSIS — Z794 Long term (current) use of insulin: Principal | ICD-10-CM

## 2015-11-28 ENCOUNTER — Ambulatory Visit
Admission: RE | Admit: 2015-11-28 | Discharge: 2015-11-28 | Disposition: A | Discharge status: Home / Self Care | Payer: 59 | Source: Ambulatory Visit | Attending: Internal Medicine | Admitting: Internal Medicine

## 2015-11-28 DIAGNOSIS — Z1231 Encounter for screening mammogram for malignant neoplasm of breast: Secondary | ICD-10-CM | POA: Diagnosis not present

## 2015-11-28 DIAGNOSIS — Z1239 Encounter for other screening for malignant neoplasm of breast: Secondary | ICD-10-CM

## 2015-12-07 ENCOUNTER — Encounter: Payer: Self-pay | Admitting: Internal Medicine

## 2015-12-08 ENCOUNTER — Other Ambulatory Visit: Payer: Self-pay | Admitting: *Deleted

## 2015-12-08 MED ORDER — SITAGLIPTIN PHOSPHATE 100 MG PO TABS: 100.0000 mg | ORAL_TABLET | Freq: Every day | ORAL | Status: DC

## 2015-12-08 MED ORDER — GLIPIZIDE ER 10 MG PO TB24: 20.0000 mg | ORAL_TABLET | Freq: Every day | ORAL | Status: DC

## 2016-01-23 ENCOUNTER — Other Ambulatory Visit: Payer: Self-pay | Admitting: *Deleted

## 2016-01-23 NOTE — Telephone Encounter (Signed)
Ok to refill? Will need written Rx to fax to mail order.

## 2016-01-24 MED ORDER — CLONAZEPAM 0.5 MG PO TABS: 0.2500 mg | ORAL_TABLET | Freq: Two times a day (BID) | ORAL | Status: DC | PRN

## 2016-01-24 NOTE — Telephone Encounter (Signed)
RX printed and signed and given to MYD for mail order

## 2016-01-24 NOTE — Telephone Encounter (Signed)
Rx faxed to mail order 

## 2016-01-31 ENCOUNTER — Other Ambulatory Visit: Payer: Self-pay

## 2016-01-31 NOTE — Telephone Encounter (Signed)
Pt had sent note requesting refill for simvastatin and one touch ultra test strips to [facility]pt checking BS 4 x a day. Advised pt would send in for pt now; pt said she has enough test strips to last until comes in for next appt and will discuss with Avie Echevaria NP and to not send to pharmacy. Pt should have available refills at [facility] for simvastatin. Pt will ck with pharmacy.

## 2016-02-08 ENCOUNTER — Encounter: Payer: Self-pay | Admitting: *Deleted

## 2016-02-09 ENCOUNTER — Encounter
Admission: RE | Disposition: A | Discharge status: Home / Self Care | Payer: Self-pay | Source: Ambulatory Visit | Attending: Gastroenterology

## 2016-02-09 ENCOUNTER — Ambulatory Visit
Admission: RE | Admit: 2016-02-09 | Discharge: 2016-02-09 | Disposition: A | Discharge status: Home / Self Care | Payer: 59 | Source: Ambulatory Visit | Attending: Gastroenterology | Admitting: Gastroenterology

## 2016-02-09 ENCOUNTER — Ambulatory Visit: Payer: 59 | Admitting: Anesthesiology

## 2016-02-09 ENCOUNTER — Encounter: Payer: Self-pay | Admitting: *Deleted

## 2016-02-09 DIAGNOSIS — D122 Benign neoplasm of ascending colon: Secondary | ICD-10-CM | POA: Diagnosis not present

## 2016-02-09 DIAGNOSIS — F329 Major depressive disorder, single episode, unspecified: Secondary | ICD-10-CM | POA: Diagnosis not present

## 2016-02-09 DIAGNOSIS — Z1211 Encounter for screening for malignant neoplasm of colon: Secondary | ICD-10-CM | POA: Diagnosis not present

## 2016-02-09 DIAGNOSIS — D123 Benign neoplasm of transverse colon: Secondary | ICD-10-CM | POA: Diagnosis not present

## 2016-02-09 DIAGNOSIS — F17210 Nicotine dependence, cigarettes, uncomplicated: Secondary | ICD-10-CM | POA: Diagnosis not present

## 2016-02-09 DIAGNOSIS — K5730 Diverticulosis of large intestine without perforation or abscess without bleeding: Secondary | ICD-10-CM | POA: Diagnosis not present

## 2016-02-09 DIAGNOSIS — K635 Polyp of colon: Secondary | ICD-10-CM | POA: Diagnosis not present

## 2016-02-09 DIAGNOSIS — E119 Type 2 diabetes mellitus without complications: Secondary | ICD-10-CM | POA: Insufficient documentation

## 2016-02-09 DIAGNOSIS — Z800 Family history of malignant neoplasm of digestive organs: Secondary | ICD-10-CM | POA: Diagnosis not present

## 2016-02-09 DIAGNOSIS — Z882 Allergy status to sulfonamides status: Secondary | ICD-10-CM | POA: Diagnosis not present

## 2016-02-09 DIAGNOSIS — Z8049 Family history of malignant neoplasm of other genital organs: Secondary | ICD-10-CM | POA: Diagnosis not present

## 2016-02-09 DIAGNOSIS — Z794 Long term (current) use of insulin: Secondary | ICD-10-CM | POA: Insufficient documentation

## 2016-02-09 DIAGNOSIS — Z79899 Other long term (current) drug therapy: Secondary | ICD-10-CM | POA: Diagnosis not present

## 2016-02-09 DIAGNOSIS — Z8249 Family history of ischemic heart disease and other diseases of the circulatory system: Secondary | ICD-10-CM | POA: Insufficient documentation

## 2016-02-09 DIAGNOSIS — Z803 Family history of malignant neoplasm of breast: Secondary | ICD-10-CM | POA: Diagnosis not present

## 2016-02-09 DIAGNOSIS — Z833 Family history of diabetes mellitus: Secondary | ICD-10-CM | POA: Diagnosis not present

## 2016-02-09 DIAGNOSIS — K621 Rectal polyp: Secondary | ICD-10-CM | POA: Insufficient documentation

## 2016-02-09 HISTORY — PX: COLONOSCOPY WITH PROPOFOL: SHX5780

## 2016-02-09 LAB — GLUCOSE, CAPILLARY: Glucose-Capillary: 202 mg/dL — ABNORMAL HIGH (ref 65–99)

## 2016-02-09 SURGERY — COLONOSCOPY WITH PROPOFOL
Anesthesia: General

## 2016-02-09 MED ORDER — SODIUM CHLORIDE 0.9 % IV SOLN
INTRAVENOUS | Status: DC
  Administered 2016-02-09: 08:00:00 via INTRAVENOUS

## 2016-02-09 MED ORDER — MIDAZOLAM HCL 2 MG/2ML IJ SOLN
INTRAMUSCULAR | Status: DC | PRN
  Administered 2016-02-09: 1 mg via INTRAVENOUS

## 2016-02-09 MED ORDER — PROPOFOL 10 MG/ML IV BOLUS
INTRAVENOUS | Status: DC | PRN
  Administered 2016-02-09: 80 mg via INTRAVENOUS

## 2016-02-09 MED ORDER — PROPOFOL 500 MG/50ML IV EMUL
INTRAVENOUS | Status: DC | PRN
  Administered 2016-02-09: 100 ug/kg/min via INTRAVENOUS

## 2016-02-09 MED ORDER — SODIUM CHLORIDE 0.9 % IV SOLN: INTRAVENOUS | Status: DC

## 2016-02-09 NOTE — H&P (Signed)
Primary Care Physician:  Webb Silversmith, NP Primary Gastroenterologist:  Dr. Candace Cruise  Pre-Procedure History & Physical: HPI:  Brittany Pruitt is a 55 y.o. female is here for an colonoscopy.  Past Medical History  Diagnosis Date  . Chicken pox   . Depression   . Diabetes mellitus without complication ([place])   . Frequent headaches     Past Surgical History  Procedure Laterality Date  . Cesarean section    . Tonsillectomy and adenoidectomy    . Abdominal hysterectomy  2013    total  . Breast biopsy Left 2013    benign    Prior to Admission medications   Medication Sig Start Date End Date Taking? Authorizing Provider  lisinopril (PRINIVIL,ZESTRIL) 5 MG tablet Take 5 mg by mouth daily.  03/28/15  Yes Historical Provider, MD  clonazePAM (KLONOPIN) 0.5 MG tablet Take 0.5-1 tablets (0.25-0.5 mg total) by mouth 2 (two) times daily as needed. 01/24/16   [name], NP  glipiZIDE (GLUCOTROL XL) 10 MG 24 hr tablet Take 2 tablets (20 mg total) by mouth daily with breakfast. 12/08/15   [name], NP  Insulin Glargine (LANTUS SOLOSTAR) 100 UNIT/ML Solostar Pen Inject 30 Units into the skin daily at 10 pm. 08/30/15   [name], NP  Insulin Pen Needle (PEN NEEDLES) 31G X 6 MM MISC 1 each by Does not apply route 2 (two) times daily. 09/11/15   [name], NP  simvastatin (ZOCOR) 20 MG tablet Take 1 tablet by mouth at  bedtime 10/16/15   [name], NP  sitaGLIPtin (JANUVIA) 100 MG tablet Take 1 tablet (100 mg total) by mouth daily. 12/08/15   [name], NP    Allergies as of 02/01/2016 - Review Complete 11/15/2015  Allergen Reaction Noted  . Sulfa antibiotics Hives 05/25/2015    Family History  Problem Relation Age of Onset  . Uterine cancer Mother   . Breast cancer Mother 57  . Pancreatic cancer Mother   . Heart disease Father   . Hypertension Father   . Anxiety disorder Father   . Diabetes Father   . Uterine cancer Maternal Aunt   . Breast cancer Maternal Aunt 60   . Heart disease Paternal Grandfather     Social History   Social History  . Marital Status: Married    Spouse Name: N/A  . Number of Children: N/A  . Years of Education: N/A   Occupational History  . Not on file.   Social History Main Topics  . Smoking status: Current Every Day Smoker -- 0.25 packs/day for 30 years    Types: Cigarettes  . Smokeless tobacco: Not on file  . Alcohol Use: No  . Drug Use: No  . Sexual Activity: No   Other Topics Concern  . Not on file   Social History Narrative    Review of Systems: See HPI, otherwise negative ROS  Physical Exam: BP 117/75 mmHg  Pulse 72  Temp(Src) 97.8 F (36.6 C) (Tympanic)  Resp 18  Ht 5\' 5"  (1.651 m)  Wt 83.008 kg (183 lb)  BMI 30.45 kg/m2  SpO2 99% General:   Alert,  pleasant and cooperative in NAD Head:  Normocephalic and atraumatic. Neck:  Supple; no masses or thyromegaly. Lungs:  Clear throughout to auscultation.    Heart:  Regular rate and rhythm. Abdomen:  Soft, nontender and nondistended. Normal bowel sounds, without guarding, and without rebound.   Neurologic:  Alert and  oriented x4;  grossly normal neurologically.  Impression/Plan: Brittany Pruitt is here for an colonoscopy to be performed for screening  Risks, benefits, limitations, and alternatives regarding  colonoscopy have been reviewed with the patient.  Questions have been answered.  All parties agreeable.   Brittany Pruitt, Brittany Dawn, MD  02/09/2016, 8:25 AM

## 2016-02-09 NOTE — Op Note (Signed)
[facility] Patient Name: Brittany Pruitt Procedure Date: 02/09/2016 8:32 AM MRN: [id number] Account #: [id number] Date of Birth: [birth year] Admit Type: Outpatient Age: 55 Room: [facility] ENDO ROOM 4 Gender: Female Note Status: Finalized Procedure:            Colonoscopy Indications:          Screening for colorectal malignant neoplasm Providers:            Lupita Dawn. Candace Cruise, MD Referring MD:         [name] (Referring MD) Medicines:            Monitored Anesthesia Care Complications:        No immediate complications. Procedure:            Pre-Anesthesia Assessment:                       - Prior to the procedure, a History and Physical was                        performed, and patient medications, allergies and                        sensitivities were reviewed. The patient's tolerance of                        previous anesthesia was reviewed.                       - The risks and benefits of the procedure and the                        sedation options and risks were discussed with the                        patient. All questions were answered and informed                        consent was obtained.                       - After reviewing the risks and benefits, the patient                        was deemed in satisfactory condition to undergo the                        procedure.                       After obtaining informed consent, the colonoscope was                        passed under direct vision. Throughout the procedure,                        the patient's blood pressure, pulse, and oxygen                        saturations were monitored continuously. The  Colonoscope was introduced through the anus and                        advanced to the the cecum, identified by appendiceal                        orifice and ileocecal valve. The colonoscopy was                        performed without difficulty. The patient  tolerated the                        procedure well. The quality of the bowel preparation                        was fair. Findings:      Four sessile polyps were found in the rectum, sigmoid colon, transverse       colon and ascending colon. The polyps were small in size.      Scattered small and large-mouthed diverticula were found in the entire       colon.      The exam was otherwise without abnormality. Impression:           - Preparation of the colon was fair.                       - Four small polyps in the rectum, in the sigmoid                        colon, in the transverse colon and in the ascending                        colon.                       - Diverticulosis in the entire examined colon.                       - The examination was otherwise normal.                       - No specimens collected. Recommendation:       - Discharge patient to home.                       - Repeat colonoscopy in 3 - 5 years for surveillance                        based on pathology results.                       - The findings and recommendations were discussed with                        the patient.                       - High fiber diet. Procedure Code(s):    --- Professional ---                       [phone], Colonoscopy, flexible; diagnostic, including  collection of specimen(s) by brushing or washing, when                        performed (separate procedure) Diagnosis Code(s):    --- Professional ---                       Z12.11, Encounter for screening for malignant neoplasm                        of colon                       K62.1, Rectal polyp                       D12.5, Benign neoplasm of sigmoid colon                       D12.3, Benign neoplasm of transverse colon (hepatic                        flexure or splenic flexure)                       D12.2, Benign neoplasm of ascending colon                       K57.30, Diverticulosis of large intestine  without                        perforation or abscess without bleeding CPT copyright 2016 American Medical Association. All rights reserved. The codes documented in this report are preliminary and upon coder review may  be revised to meet current compliance requirements. Hulen Luster, MD 02/09/2016 8:57:19 AM This report has been signed electronically. Number of Addenda: 0 Note Initiated On: 02/09/2016 8:32 AM Scope Withdrawal Time: 0 hours 9 minutes 28 seconds  Total Procedure Duration: 0 hours 13 minutes 41 seconds       [facility]

## 2016-02-09 NOTE — Anesthesia Postprocedure Evaluation (Signed)
Anesthesia Post Note  Patient: Brittany Pruitt  Procedure(s) Performed: Procedure(s) (LRB): COLONOSCOPY WITH PROPOFOL (N/A)  Patient location during evaluation: PACU Anesthesia Type: General Level of consciousness: awake and alert Pain management: pain level controlled Vital Signs Assessment: post-procedure vital signs reviewed and stable Respiratory status: spontaneous breathing, nonlabored ventilation, respiratory function stable and patient connected to nasal cannula oxygen Cardiovascular status: blood pressure returned to baseline and stable Postop Assessment: no signs of nausea or vomiting Anesthetic complications: no    Last Vitals:  Filed Vitals:   02/09/16 0919 02/09/16 0929  BP: 95/66 107/61  Pulse:    Temp:    Resp: 16 14    Last Pain: There were no vitals filed for this visit.               Daine Croker S

## 2016-02-09 NOTE — Transfer of Care (Signed)
Immediate Anesthesia Transfer of Care Note  Patient: Brittany Pruitt  Procedure(s) Performed: Procedure(s): COLONOSCOPY WITH PROPOFOL (N/A)  Patient Location: PACU and Endoscopy Unit  Anesthesia Type:General  Level of Consciousness: awake, alert  and oriented  Airway & Oxygen Therapy: Patient Spontanous Breathing and Patient connected to nasal cannula oxygen  Post-op Assessment: Report given to RN and Post -op Vital signs reviewed and stable  Post vital signs: Reviewed and stable  Last Vitals:  Filed Vitals:   02/09/16 0740 02/09/16 0857  BP: 117/75 86/56  Pulse: 72 65  Temp: 36.6 C   Resp: 18 20    Complications: No apparent anesthesia complications

## 2016-02-09 NOTE — Anesthesia Preprocedure Evaluation (Signed)
Anesthesia Evaluation  Patient identified by MRN, date of birth, ID band Patient awake    Reviewed: Allergy & Precautions, NPO status , Patient's Chart, lab work & pertinent test results, reviewed documented beta blocker date and time   Airway Mallampati: II  TM Distance: >3 FB     Dental  (+) Chipped   Pulmonary Current Smoker,           Cardiovascular      Neuro/Psych  Headaches, PSYCHIATRIC DISORDERS    GI/Hepatic   Endo/Other  diabetes, Type 2  Renal/GU      Musculoskeletal   Abdominal   Peds  Hematology   Anesthesia Other Findings   Reproductive/Obstetrics                             Anesthesia Physical Anesthesia Plan  ASA: III  Anesthesia Plan: General   Post-op Pain Management:    Induction: Intravenous  Airway Management Planned:   Additional Equipment:   Intra-op Plan:   Post-operative Plan:   Informed Consent: I have reviewed the patients History and Physical, chart, labs and discussed the procedure including the risks, benefits and alternatives for the proposed anesthesia with the patient or authorized representative who has indicated his/her understanding and acceptance.     Plan Discussed with: CRNA  Anesthesia Plan Comments:         Anesthesia Quick Evaluation

## 2016-02-12 LAB — SURGICAL PATHOLOGY

## 2016-02-13 ENCOUNTER — Ambulatory Visit (INDEPENDENT_AMBULATORY_CARE_PROVIDER_SITE_OTHER): Payer: 59 | Admitting: Internal Medicine

## 2016-02-13 ENCOUNTER — Encounter: Payer: Self-pay | Admitting: Gastroenterology

## 2016-02-13 VITALS — BP 116/74 | HR 70 | Temp 98.2°F | Wt 186.8 lb

## 2016-02-13 DIAGNOSIS — E785 Hyperlipidemia, unspecified: Secondary | ICD-10-CM

## 2016-02-13 DIAGNOSIS — J383 Other diseases of vocal cords: Secondary | ICD-10-CM | POA: Diagnosis not present

## 2016-02-13 DIAGNOSIS — Z794 Long term (current) use of insulin: Secondary | ICD-10-CM

## 2016-02-13 DIAGNOSIS — E1165 Type 2 diabetes mellitus with hyperglycemia: Secondary | ICD-10-CM

## 2016-02-13 MED ORDER — INSULIN LISPRO 100 UNIT/ML (KWIKPEN): PEN_INJECTOR | SUBCUTANEOUS | Status: DC

## 2016-02-13 NOTE — Assessment & Plan Note (Signed)
Will check Lipid profile today Encouraged her to consume a low fat diet. Continue Zocor and baby ASA daily

## 2016-02-13 NOTE — Progress Notes (Signed)
HPI  Pt presents to the clinic today to follow up chronic conditions  DM 2: Pt went to endocrinologist and was prescribed Humalog for meals. Takes Lantus at night. She is no longer taking any oral glucose medications. Fasting BGL 80-120. Last A1c 11%. Still eating a low carb diet. She does not exercise. She is taking Lisinopril for renal protection but has never been told she had high blood pressure. When she started the medicine she had an increased cough that has since subsided. She does cough more at night now, but not enough to make her want to change medicines.  Her last eye exam was > 2 years ago. Flu 09/2015. Pneumovax never. Prevnar 05/2015.  HLD: Her last LDL was 58. She is on Zocor. She does report some muscle cramping in her legs but nothing consistent. She does not take a baby ASA daily. She does try to consume a low carb diet.  She does take fish oil supplements.   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 She has tried Botox with a specialist at [facility]. They did not seem to work. She takes the Clonazepam twice daily to help with this. She reports that she also has some anxiety which it helps with. She has had depression in the past but no reoccurrence. She denies SI/HI.     Past Medical History    Diagnosis   Date    .   Chicken pox       .   Depression       .   Diabetes mellitus without complication       .   Frequent headaches           Current Outpatient Prescriptions    Medication   Sig   Dispense   Refill    .   clonazePAM (KLONOPIN) 0.5 MG tablet   Take 0.25-0.5 mg by mouth 2 (two) times daily as needed.           Marland Kitchen   glipiZIDE (GLUCOTROL XL) 10 MG 24 hr tablet   Take 20 mg by mouth daily with breakfast.           .   lisinopril (PRINIVIL,ZESTRIL) 5 MG tablet   Take 5 mg by mouth daily.           .   sitaGLIPtin (JANUVIA) 100 MG tablet   Take 100 mg by mouth daily.             No current facility-administered medications for this visit.        Allergies     Allergen   Reactions    .   Sulfa Antibiotics   Hives        Family History    Problem   Relation   Age of Onset    .   Uterine cancer   Mother       .   Breast cancer   Mother       .   Pancreatic cancer   Mother       .   Heart disease   Father       .   Hypertension   Father       .   Anxiety disorder   Father       .   Uterine cancer   Maternal Aunt       .   Breast cancer   Maternal Aunt       .  Heart disease   Paternal Grandfather           History       Social History    .   Marital Status:   Married          Spouse Name:   N/A    .   Number of Children:   N/A    .   Years of Education:   N/A       Occupational History    .   Not on file.       Social History Main Topics    .   Smoking status:   Current Every Day Smoker -- 0.25 packs/day          Types:   Cigarettes    .   Smokeless tobacco:   Not on file    .   Alcohol Use:   No    .   Drug Use:   No    .   Sexual Activity:   Not on file       Other Topics   Concern    .   Not on file       Social History Narrative    .   No narrative on file      ROS:  Constitutional: Denies fever, malaise, fatigue, headache or abrupt weight changes.   HEENT: Denies eye pain, eye redness, ear pain, ringing in the ears, wax buildup, runny nose, nasal congestion, bloody nose, or sore throat. Respiratory: Denies difficulty breathing, shortness of breath, cough or sputum production.    Cardiovascular: Denies chest pain, chest tightness, palpitations or swelling in the hands or feet.   Gastrointestinal: Denies abdominal pain, bloating, constipation, diarrhea or blood in the stool.   GU: Denies frequency, urgency, pain with urination, blood in urine, odor or discharge. Musculoskeletal: Pt reports muscle cramps. Denies decrease in range of motion, difficulty with gait, muscle pain or joint pain and swelling.   Skin: Pt reports lesion on her left lower lid. Denies redness, rashes, lesions or ulcercations.   Neurological: Pt  reports difficulty with speech. Denies dizziness, difficulty with memory,or problems with balance and coordination.   Psych: Pt reports anxiety. Denies depression, SI/HI.  No other specific complaints in a complete review of systems (except as listed in HPI above).  PE:  BP 120/88 mmHg  Pulse 74  Temp(Src) 97.5 F (36.4 C) (Oral)  Ht 5\' 5"  (1.651 m)  Wt 188 lb (85.276 kg)  BMI 31.28 kg/m2  SpO2 97% Wt Readings from Last 3 Encounters:    05/25/15   188 lb (85.276 kg)      General: Appears her stated age, obese in NAD. Skin: Warm, dry and intact.  HEENT: Head: normal shape and size; Eyes: sclera white, no icterus, conjunctiva pink, PERRLA and EOMs intact; Voice notably strained.  Cardiovascular: Normal rate and rhythm. S1,S2 noted.  No murmur, rubs or gallops noted.   Pulmonary/Chest: Normal effort and positive vesicular breath sounds. No respiratory distress. No wheezes, rales or ronchi noted.  Neurological: Alert and oriented.   Psychiatric: Mood and affect normal. Behavior is normal. Judgment and thought content normal.   Assessment and Plan:

## 2016-02-13 NOTE — Assessment & Plan Note (Signed)
Continue Clonazepam If worsens, consider speech therapy

## 2016-02-13 NOTE — Patient Instructions (Signed)

## 2016-02-13 NOTE — Assessment & Plan Note (Signed)
Will check A1C No microalbumin secondary to ACEI therapy Encouraged her to consume a low fat, low carb diet Encouraged yearly eye exam Flu and pneumonia vaccine UTD Foot exam today Continue Humalog and Lantus, will adjust if needed

## 2016-02-13 NOTE — Progress Notes (Signed)
Pre visit review using our clinic review tool, if applicable. No additional management support is needed unless otherwise documented below in the visit note. 

## 2016-02-14 NOTE — Addendum Note (Signed)
Addended by: Marchia Bond on: 02/14/2016 08:38 AM   Modules accepted: Orders

## 2016-02-15 ENCOUNTER — Other Ambulatory Visit: Payer: Self-pay | Admitting: Internal Medicine

## 2016-02-15 LAB — LIPID PANEL
CHOL/HDL RATIO: 4.3 ratio (ref 0.0–4.4)
Cholesterol, Total: 155 mg/dL (ref 100–199)
HDL: 36 mg/dL — AB (ref >39)
LDL Calculated: 73 mg/dL (ref 0–99)
TRIGLYCERIDES: 230 mg/dL — AB (ref 0–149)
VLDL CHOLESTEROL CAL: 46 mg/dL — AB (ref 5–40)

## 2016-02-15 LAB — HEMOGLOBIN A1C
Est. average glucose Bld gHb Est-mCnc: 223 mg/dL
HEMOGLOBIN A1C: 9.4 % — AB (ref 4.8–5.6)

## 2016-02-15 MED ORDER — LISINOPRIL 5 MG PO TABS: 5.0000 mg | ORAL_TABLET | Freq: Every day | ORAL | Status: DC

## 2016-02-15 MED ORDER — SIMVASTATIN 20 MG PO TABS: ORAL_TABLET | ORAL | Status: DC

## 2016-02-15 MED ORDER — INSULIN GLARGINE 100 UNIT/ML SOLOSTAR PEN: 34.0000 [IU] | PEN_INJECTOR | Freq: Every day | SUBCUTANEOUS | Status: DC

## 2016-03-09 ENCOUNTER — Other Ambulatory Visit: Payer: Self-pay | Admitting: Internal Medicine

## 2016-03-09 NOTE — Telephone Encounter (Signed)
Last office visit 02/13/2016.  Last refilled 01/24/2016 for #30 with no refills.  Ok to refill?

## 2016-03-11 MED ORDER — CLONAZEPAM 0.5 MG PO TABS: 0.2500 mg | ORAL_TABLET | Freq: Two times a day (BID) | ORAL | Status: DC | PRN

## 2016-03-11 NOTE — Addendum Note (Signed)
Addended by: Lurlean Nanny on: 03/11/2016 04:49 PM   Modules accepted: Orders

## 2016-03-11 NOTE — Telephone Encounter (Signed)
Ok to phone in Clonazepam 

## 2016-03-11 NOTE — Telephone Encounter (Signed)
This is for mail order--has to be faxed---Rx placed in your inbox to sign

## 2016-03-12 NOTE — Telephone Encounter (Signed)
Rx faxed to mail order 

## 2016-03-12 NOTE — Telephone Encounter (Signed)
Done, put in MYD box

## 2016-05-29 ENCOUNTER — Other Ambulatory Visit: Payer: Self-pay

## 2016-05-29 MED ORDER — CLONAZEPAM 0.5 MG PO TABS: 0.2500 mg | ORAL_TABLET | Freq: Two times a day (BID) | ORAL | 0 refills | Status: DC | PRN

## 2016-05-29 MED ORDER — GLUCOSE BLOOD VI STRP: ORAL_STRIP | 1 refills | Status: DC

## 2016-05-29 NOTE — Telephone Encounter (Signed)
Pt came by to ck on status of clonazepam refill to [facility]. Last refill # 30 on 03/11/16. Pt last seen 02/13/16. Pt request 90 day supply because pt takes 1/2 tab in AM and 1 tab in PM every day. Pt also request R [name] NP to prescribe one touch ultra test strips; pt test qid. For 90 day supply to mail order [facility]. I put on med list because pt no longer sees endo. Please advise.pt request cb.

## 2016-05-29 NOTE — Telephone Encounter (Signed)
I'm not doing 90 day supply on controlled substance. Ok to phone in Clonazepam

## 2016-05-31 MED ORDER — CLONAZEPAM 0.5 MG PO TABS: 0.2500 mg | ORAL_TABLET | Freq: Two times a day (BID) | ORAL | 0 refills | Status: DC | PRN

## 2016-05-31 NOTE — Addendum Note (Signed)
Addended by: Lurlean Nanny on: 05/31/2016 09:47 AM   Modules accepted: Orders

## 2016-05-31 NOTE — Telephone Encounter (Signed)
Rx faxed to [facility] 30 day supply

## 2016-05-31 NOTE — Telephone Encounter (Signed)
A previous Rx for 30 days was sent to mail order---advised pt 90 day supply cannot be sent but we can send 30 day supply--pt expressed understanding insurance will not pay for it at regular pharmacy--Rx printed and placed in your box for signature --I will fax to mail order when completed

## 2016-05-31 NOTE — Telephone Encounter (Signed)
RX signed and in your inbox

## 2016-06-04 ENCOUNTER — Other Ambulatory Visit: Payer: Self-pay | Admitting: Internal Medicine

## 2016-06-07 ENCOUNTER — Ambulatory Visit (INDEPENDENT_AMBULATORY_CARE_PROVIDER_SITE_OTHER): Payer: 59 | Admitting: Internal Medicine

## 2016-06-07 ENCOUNTER — Encounter: Payer: Self-pay | Admitting: Internal Medicine

## 2016-06-07 VITALS — BP 118/78 | HR 68 | Temp 98.6°F | Wt 193.0 lb

## 2016-06-07 DIAGNOSIS — Z794 Long term (current) use of insulin: Secondary | ICD-10-CM

## 2016-06-07 DIAGNOSIS — E1142 Type 2 diabetes mellitus with diabetic polyneuropathy: Secondary | ICD-10-CM | POA: Diagnosis not present

## 2016-06-07 MED ORDER — GABAPENTIN 100 MG PO CAPS: 100.0000 mg | ORAL_CAPSULE | Freq: Two times a day (BID) | ORAL | 1 refills | Status: DC

## 2016-06-07 NOTE — Patient Instructions (Signed)
Complementary and Alternative Medical Therapies for Diabetes Complementary and alternative medicines are health care practices or products that are not always accepted as part of routine medicine. Complementary medicine is used along with routine medicine (medical therapy). Alternative medicine can sometimes be used instead of routine medicine. Some people use these methods to treat diabetes. While some of these therapies may be effective, others may not be. Some may even be harmful. Patients using these methods need to tell their caregiver. It is important to let your caregivers know what you are doing. Some of these therapies are discussed below. For more information, talk with your caregiver. THERAPIES Acupuncture Acupuncture is done by a professional who inserts needles into certain points on the skin. Some scientists believe that this triggers the release of the body's natural painkillers. It has been shown to relieve long-term (chronic) pain. This may help patients with painful nerve damage caused by diabetes. Biofeedback Biofeedback helps a person become more aware of the body's response to pain. It also helps you learn to deal with the pain. This alternative therapy focuses on relaxation and stress-reduction techniques. Thinking of peaceful mental images (guided imagery) is one technique. Some people believe these images can ease their condition. MEDICATIONS Chromium Several studies report that chromium supplements may improve diabetes control. Chromium helps insulin improve its action. Research is not yet certain. Supplements have not been recommended or approved. Caution is needed if you have kidney (renal) problems. Ginseng There are several types of ginseng plants. American ginseng is used for diabetes studies. Those studies have shown some glucose-lowering effects. Those effects have been seen with fasting and after-meal blood glucose levels. They have also been seen in A1c levels (average  blood glucose levels over a 3-month period). More long-term studies are needed before recommendations for use of ginseng can be made. Magnesium Experts have studied the relationship between magnesium and diabetes for many years. But it is not yet fully understood. Studies suggest that a low amount of magnesium may make blood glucose control worse in type 2 diabetes. Research also shows that a low amount may contribute to certain diabetes complications. One study showed that people who consume more magnesium had less risk of type 2 diabetes. Eating whole grains, nuts, and green leafy vegetables raises the magnesium level. Vanadium Vanadium is a compound found in tiny amounts in plants and animals. Early studies showed that vanadium improved blood glucose levels in animals with type 1 and type 2 diabetes. One study found that when given vanadium, those with diabetes were able to decrease their insulin dosage. Researchers still need to learn how it works in the body to discover any side effects, and to find safe dosages. Cinnamon There have been a couple of studies that seem to indicate cinnamon decreases insulin resistance and increases insulin production. By doing so, it may lower blood glucose. Exact doses are unknown, but it may work best when used in combination with other diabetes medicines.   This information is not intended to replace advice given to you by your health care provider. Make sure you discuss any questions you have with your health care provider.   Document Released: 08/18/2007 Document Revised: 01/13/2012 Document Reviewed: 08/31/2009 Elsevier Interactive Patient Education 2016 [employer]  

## 2016-06-07 NOTE — Progress Notes (Signed)
HPI  Pt presents to the clinic today for 3 month follow up diabetes.  Pt reports she is taking Humalog 06-14-11 for meals daily as prescribed by the endocrinologist. Takes 34 units Lantus daily at night. She is no longer taking any oral glucose medications. She is checking her blood glucose 4x/day.  Fasting BGL 160-200 in the morning, and 98-115 before lunch and dinner.  She reports these levels have improved since before increasing the Lantus dose.  Last A1c 9.4%.  She continues to eat a low carb diet, and denies snacking prior to bedtime.  She does not exercise.  She is taking Lisinopril for renal protection but has never been told she had high blood pressure. When she started the medicine she had an increased cough that has since subsided and she denies any current symptoms.  Her last eye exam was > 2 years ago. Flu 09/2015. Pneumovax never. Prevnar 05/2015.  She admits to numbness and tingling in her extremities that has been worsening since April.  She reports it occurs daily and is constant throughout the day, and is starting to bother her.  She denies dizziness, headaches, vision changes, shortness of breath, or chest pain.     Past Medical History    Diagnosis   Date    .   Chicken pox       .   Depression       .   Diabetes mellitus without complication       .   Frequent headaches           Current Outpatient Prescriptions    Medication   Sig   Dispense   Refill    .   clonazePAM (KLONOPIN) 0.5 MG tablet   Take 0.25-0.5 mg by mouth 2 (two) times daily as needed.           Marland Kitchen   glipiZIDE (GLUCOTROL XL) 10 MG 24 hr tablet   Take 20 mg by mouth daily with breakfast.           .   lisinopril (PRINIVIL,ZESTRIL) 5 MG tablet   Take 5 mg by mouth daily.           .   sitaGLIPtin (JANUVIA) 100 MG tablet   Take 100 mg by mouth daily.             No current facility-administered medications for this visit.        Allergies    Allergen   Reactions    .   Sulfa Antibiotics   Hives         Family History    Problem   Relation   Age of Onset    .   Uterine cancer   Mother       .   Breast cancer   Mother       .   Pancreatic cancer   Mother       .   Heart disease   Father       .   Hypertension   Father       .   Anxiety disorder   Father       .   Uterine cancer   Maternal Aunt       .   Breast cancer   Maternal Aunt       .   Heart disease   Paternal Grandfather           History  Social History    .   Marital Status:   Married          Spouse Name:   N/A    .   Number of Children:   N/A    .   Years of Education:   N/A       Occupational History    .   Not on file.       Social History Main Topics    .   Smoking status:   Current Every Day Smoker -- 0.25 packs/day          Types:   Cigarettes    .   Smokeless tobacco:   Not on file    .   Alcohol Use:   No    .   Drug Use:   No    .   Sexual Activity:   Not on file       Other Topics   Concern    .   Not on file       Social History Narrative    .   No narrative on file      ROS:  Constitutional: Denied headaches and dizziness.   HEENT: Denies vision changes. Respiratory: Denies shortness of breath or cough.    Cardiovascular: Denies chest pain, chest tightness, palpitations or swelling in the hands or feet.   Skin: Denies redness, rashes, lesions or ulcercations.   Neurological: Pt reports numbness and tingling in the extremities and difficulty with speech.   Psych: Pt reports anxiety. Denies depression, SI/HI.  No other specific complaints in a complete review of systems (except as listed in HPI above).  PE:  BP 120/88 mmHg  Pulse 74  Temp(Src) 97.5 F (36.4 C) (Oral)  Ht 5\' 5"  (1.651 m)  Wt 188 lb (85.276 kg)  BMI 31.28 kg/m2  SpO2 97% Wt Readings from Last 3 Encounters:    05/25/15   188 lb (85.276 kg)      General: Appears her stated age, obese in NAD. Skin: Warm, dry and intact.  HEENT: Head: normal shape and size; Eyes: sclera white, no icterus, conjunctiva pink,  PERRLA and EOMs intact; Voice notably strained.  Cardiovascular: Normal rate and rhythm. S1,S2 noted.  No murmur, rubs or gallops noted. Peripheral pulses 2+. Pulmonary/Chest: Normal effort and positive vesicular breath sounds. No respiratory distress. No wheezes, rales or ronchi noted.  Neurological: Alert and oriented. Sensation to light touch intact throughout upper and lower extremities, pt reports increased tingling with light touch.  Psychiatric: Mood and affect normal. Behavior is normal. Judgment and thought content normal.   Assessment and Plan:  Diabetes mellitus type 2:  Will check A1C today Foot exam today Encouraged annual eye exam Continue Humalog and Lantus as prescribed Follow-up based on lab results  Neuropathic pain:  Gabapentin 100mg  twice a day Call if symptoms worsen   Jashua Knaak, NP

## 2016-06-09 ENCOUNTER — Encounter: Payer: Self-pay | Admitting: Internal Medicine

## 2016-06-10 ENCOUNTER — Other Ambulatory Visit (INDEPENDENT_AMBULATORY_CARE_PROVIDER_SITE_OTHER): Payer: 59

## 2016-06-10 DIAGNOSIS — Z794 Long term (current) use of insulin: Secondary | ICD-10-CM | POA: Diagnosis not present

## 2016-06-10 DIAGNOSIS — E1142 Type 2 diabetes mellitus with diabetic polyneuropathy: Secondary | ICD-10-CM

## 2016-06-11 LAB — HEMOGLOBIN A1C
ESTIMATED AVERAGE GLUCOSE: 206 mg/dL
Hgb A1c MFr Bld: 8.8 % — ABNORMAL HIGH (ref 4.8–5.6)

## 2016-06-20 ENCOUNTER — Other Ambulatory Visit: Payer: Self-pay

## 2016-06-20 NOTE — Telephone Encounter (Signed)
I will address this as soon as I return

## 2016-06-20 NOTE — Telephone Encounter (Signed)
Refill request for 30 day supply mail order---form in box for signature

## 2016-06-25 MED ORDER — CLONAZEPAM 0.5 MG PO TABS: 0.2500 mg | ORAL_TABLET | Freq: Two times a day (BID) | ORAL | 0 refills | Status: DC | PRN

## 2016-06-25 NOTE — Telephone Encounter (Signed)
Form completed, placed in MYD box 

## 2016-06-26 NOTE — Telephone Encounter (Signed)
Rx faxed to mail order 

## 2016-07-10 ENCOUNTER — Other Ambulatory Visit: Payer: Self-pay | Admitting: Internal Medicine

## 2016-08-27 ENCOUNTER — Ambulatory Visit (INDEPENDENT_AMBULATORY_CARE_PROVIDER_SITE_OTHER): Payer: 59 | Admitting: Internal Medicine

## 2016-08-27 ENCOUNTER — Encounter: Payer: Self-pay | Admitting: Internal Medicine

## 2016-08-27 VITALS — BP 116/74 | HR 59 | Temp 98.9°F | Wt 197.0 lb

## 2016-08-27 DIAGNOSIS — E6609 Other obesity due to excess calories: Secondary | ICD-10-CM | POA: Diagnosis not present

## 2016-08-27 DIAGNOSIS — IMO0001 Reserved for inherently not codable concepts without codable children: Secondary | ICD-10-CM

## 2016-08-27 DIAGNOSIS — B070 Plantar wart: Secondary | ICD-10-CM | POA: Diagnosis not present

## 2016-08-27 DIAGNOSIS — Z716 Tobacco abuse counseling: Secondary | ICD-10-CM | POA: Diagnosis not present

## 2016-08-27 DIAGNOSIS — Z720 Tobacco use: Secondary | ICD-10-CM | POA: Diagnosis not present

## 2016-08-27 MED ORDER — BUPROPION HCL ER (SR) 150 MG PO TB12
150.0000 mg | ORAL_TABLET | Freq: Two times a day (BID) | ORAL | 2 refills | Status: DC
Start: 2016-08-27 — End: 2017-02-28

## 2016-08-27 NOTE — Progress Notes (Signed)
Subjective:    Patient ID: Brittany Pruitt, female    DOB: [birth year], 55 y.o.   MRN: [id number]  HPI  Pt presents to the clinic today with c/o a callus on the bottom of her left foot. She noticed this a few weeks ago. The lesion is painful when she walks. She has not tried anything OTC for this because she was concerned about infection with her history of diabetes.   She also has a form that she needs completed for her employer. It is an exemption form for smoking and BMI. She reports she has been losing weight slowly over the last year. Her BMI is currently 32.78. She is only smoking a few cigarettes per week. She did successfully stop using Wellbutrin in the past, and would like to get started on it again.  Review of Systems      Past Medical History:  Diagnosis Date  . Chicken pox   . Depression   . Diabetes mellitus without complication ([place])   . Frequent headaches     Current Outpatient Prescriptions  Medication Sig Dispense Refill  . clonazePAM (KLONOPIN) 0.5 MG tablet Take 0.5-1 tablets (0.25-0.5 mg total) by mouth 2 (two) times daily as needed. 45 tablet 0  . gabapentin (NEURONTIN) 100 MG capsule Take 1 capsule (100 mg total) by mouth 2 (two) times daily. 180 capsule 1  . glucose blood (ONE TOUCH ULTRA TEST) test strip Check blood sugar four times a day and as instructed. Dx. E11.65 360 each 1  . Insulin Glargine (LANTUS SOLOSTAR) 100 UNIT/ML Solostar Pen Inject 34 Units into the skin daily at 10 pm. (Patient taking differently: Inject 38-40 Units into the skin daily at 10 pm. ) 12 pen 3  . insulin lispro (HUMALOG) 100 UNIT/ML KiwkPen 8 units with breakfast, 10 units with lunch, 12 units with dinner 15 mL 11  . Insulin Pen Needle (PEN NEEDLES) 31G X 6 MM MISC 1 each by Does not apply route 2 (two) times daily. 200 each 4  . lisinopril (PRINIVIL,ZESTRIL) 5 MG tablet Take 1 tablet by mouth  daily 90 tablet 1  . simvastatin (ZOCOR) 20 MG tablet Take 1 tablet by mouth at   bedtime 90 tablet 1   No current facility-administered medications for this visit.     Allergies  Allergen Reactions  . Sulfa Antibiotics Hives    Family History  Problem Relation Age of Onset  . Uterine cancer Mother   . Breast cancer Mother 60  . Pancreatic cancer Mother   . Heart disease Father   . Hypertension Father   . Anxiety disorder Father   . Diabetes Father   . Uterine cancer Maternal Aunt   . Breast cancer Maternal Aunt 60  . Heart disease Paternal Grandfather     Social History   Social History  . Marital status: Married    Spouse name: N/A  . Number of children: N/A  . Years of education: N/A   Occupational History  . Not on file.   Social History Main Topics  . Smoking status: Current Every Day Smoker    Packs/day: 0.25    Years: 30.00    Types: Cigarettes  . Smokeless tobacco: Never Used  . Alcohol use No  . Drug use: No  . Sexual activity: No   Other Topics Concern  . Not on file   Social History Narrative  . No narrative on file     Constitutional: Denies fever, malaise, fatigue, headache or abrupt  weight changes.  Respiratory: Denies difficulty breathing, shortness of breath, cough or sputum production.   Cardiovascular: Denies chest pain, chest tightness, palpitations or swelling in the hands or feet.  Skin: Pt reports lesion on bottom of left foot. Denies redness, rashes, or ulcercations.    No other specific complaints in a complete review of systems (except as listed in HPI above).  Objective:   Physical Exam   BP 116/74   Pulse (!) 59   Temp 98.9 F (37.2 C) (Oral)   Wt 197 lb (89.4 kg)   SpO2 97%   BMI 32.78 kg/m  Wt Readings from Last 3 Encounters:  08/27/16 197 lb (89.4 kg)  06/07/16 193 lb (87.5 kg)  02/13/16 186 lb 12 oz (84.7 kg)    General: Appears her stated age, obese in NAD. Skin: Warm, dry and intact. Plantar wart noted on bottom of left foot. Cardiovascular: Normal rate and rhythm. S1,S2 noted.  No  murmur, rubs or gallops noted.  Pulmonary/Chest: Normal effort and positive vesicular breath sounds. No respiratory distress. No wheezes, rales or ronchi noted.   BMET    Component Value Date/Time   NA 138 11/15/2015 1500   NA 137 05/06/2012 0949   K 4.3 11/15/2015 1500   K 4.5 05/06/2012 0949   CL 97 11/15/2015 1500   CL 103 05/06/2012 0949   CO2 24 11/15/2015 1500   CO2 27 05/06/2012 0949   GLUCOSE 235 (H) 11/15/2015 1500   GLUCOSE 123 (H) 05/06/2012 0949   BUN 9 11/15/2015 1500   BUN 10 05/06/2012 0949   CREATININE 0.58 11/15/2015 1500   CREATININE 0.73 05/06/2012 0949   CALCIUM 9.7 11/15/2015 1500   CALCIUM 9.2 05/06/2012 0949   GFRNONAA 103 11/15/2015 1500   GFRNONAA >60 05/06/2012 0949   GFRAA 119 11/15/2015 1500   GFRAA >60 05/06/2012 0949    Lipid Panel     Component Value Date/Time   CHOL 155 02/14/2016 0838   TRIG 230 (H) 02/14/2016 0838   HDL 36 (L) 02/14/2016 0838   CHOLHDL 4.3 02/14/2016 0838   LDLCALC 73 02/14/2016 0838    CBC    Component Value Date/Time   WBC 11.3 (H) 11/15/2015 1500   WBC 11.7 (H) 05/06/2012 0949   RBC 5.29 (H) 11/15/2015 1500   RBC 5.01 05/06/2012 0949   HGB 12.4 05/22/2012 0603   HCT 46.6 11/15/2015 1500   PLT 270 11/15/2015 1500   MCV 88 11/15/2015 1500   MCV 88 05/06/2012 0949   MCH 29.5 11/15/2015 1500   MCH 28.7 05/06/2012 0949   MCHC 33.5 11/15/2015 1500   MCHC 32.7 05/06/2012 0949   RDW 13.8 11/15/2015 1500   RDW 13.7 05/06/2012 0949   LYMPHSABS 4.2 (H) 05/25/2015 1102   EOSABS 0.1 05/25/2015 1102   BASOSABS 0.0 05/25/2015 1102    Hgb A1C Lab Results  Component Value Date   HGBA1C 8.8 (H) 06/10/2016       Assessment & Plan:   Plantar wart:  Referral to podiatry given history of DM 2  Smoking Cessation:  eRx for Wellbutrin Handout given on smoking cessation: tips for success Insurance form completed  Obesity:  Advised her to lose 5% of her total weight over the next year Encouraged her to work  on diet and exercise Insurance form complete  RTC as needed Webb Silversmith, NP

## 2016-08-27 NOTE — Patient Instructions (Signed)
Smoking Cessation, Tips for Success If you are ready to quit smoking, congratulations! You have chosen to help yourself be healthier. Cigarettes bring nicotine, tar, carbon monoxide, and other irritants into your body. Your lungs, heart, and blood vessels will be able to work better without these poisons. There are many different ways to quit smoking. Nicotine gum, nicotine patches, a nicotine inhaler, or nicotine nasal spray can help with physical craving. Hypnosis, support groups, and medicines help break the habit of smoking. WHAT THINGS CAN I DO TO MAKE QUITTING EASIER?  Here are some tips to help you quit for good:  Pick a date when you will quit smoking completely. Tell all of your friends and family about your plan to quit on that date.  Do not try to slowly cut down on the number of cigarettes you are smoking. Pick a quit date and quit smoking completely starting on that day.  Throw away all cigarettes.   Clean and remove all ashtrays from your home, work, and car.  On a card, write down your reasons for quitting. Carry the card with you and read it when you get the urge to smoke.  Cleanse your body of nicotine. Drink enough water and fluids to keep your urine clear or pale yellow. Do this after quitting to flush the nicotine from your body.  Learn to predict your moods. Do not let a bad situation be your excuse to have a cigarette. Some situations in your life might tempt you into wanting a cigarette.  Never have "just one" cigarette. It leads to wanting another and another. Remind yourself of your decision to quit.  Change habits associated with smoking. If you smoked while driving or when feeling stressed, try other activities to replace smoking. Stand up when drinking your coffee. Brush your teeth after eating. Sit in a different chair when you read the paper. Avoid alcohol while trying to quit, and try to drink fewer caffeinated beverages. Alcohol and caffeine may urge you to  smoke.  Avoid foods and drinks that can trigger a desire to smoke, such as sugary or spicy foods and alcohol.  Ask people who smoke not to smoke around you.  Have something planned to do right after eating or having a cup of coffee. For example, plan to take a walk or exercise.  Try a relaxation exercise to calm you down and decrease your stress. Remember, you may be tense and nervous for the first 2 weeks after you quit, but this will pass.  Find new activities to keep your hands busy. Play with a pen, coin, or rubber band. Doodle or draw things on paper.  Brush your teeth right after eating. This will help cut down on the craving for the taste of tobacco after meals. You can also try mouthwash.   Use oral substitutes in place of cigarettes. Try using lemon drops, carrots, cinnamon sticks, or chewing gum. Keep them handy so they are available when you have the urge to smoke.  When you have the urge to smoke, try deep breathing.  Designate your home as a nonsmoking area.  If you are a heavy smoker, ask your health care provider about a prescription for nicotine chewing gum. It can ease your withdrawal from nicotine.  Reward yourself. Set aside the cigarette money you save and buy yourself something nice.  Look for support from others. Join a support group or smoking cessation program. Ask someone at home or at work to help you with your plan   to quit smoking.  Always ask yourself, "Do I need this cigarette or is this just a reflex?" Tell yourself, "Today, I choose not to smoke," or "I do not want to smoke." You are reminding yourself of your decision to quit.  Do not replace cigarette smoking with electronic cigarettes (commonly called e-cigarettes). The safety of e-cigarettes is unknown, and some may contain harmful chemicals.  If you relapse, do not give up! Plan ahead and think about what you will do the next time you get the urge to smoke. HOW WILL I FEEL WHEN I QUIT SMOKING? You  may have symptoms of withdrawal because your body is used to nicotine (the addictive substance in cigarettes). You may crave cigarettes, be irritable, feel very hungry, cough often, get headaches, or have difficulty concentrating. The withdrawal symptoms are only temporary. They are strongest when you first quit but will go away within 10-14 days. When withdrawal symptoms occur, stay in control. Think about your reasons for quitting. Remind yourself that these are signs that your body is healing and getting used to being without cigarettes. Remember that withdrawal symptoms are easier to treat than the major diseases that smoking can cause.  Even after the withdrawal is over, expect periodic urges to smoke. However, these cravings are generally short lived and will go away whether you smoke or not. Do not smoke! WHAT RESOURCES ARE AVAILABLE TO HELP ME QUIT SMOKING? Your health care provider can direct you to community resources or hospitals for support, which may include:  Group support.  Education.  Hypnosis.  Therapy.   This information is not intended to replace advice given to you by your health care provider. Make sure you discuss any questions you have with your health care provider.   Document Released: 07/19/2004 Document Revised: 11/11/2014 Document Reviewed: 04/08/2013 Elsevier Interactive Patient Education 2016 [employer]  

## 2016-09-13 ENCOUNTER — Ambulatory Visit (INDEPENDENT_AMBULATORY_CARE_PROVIDER_SITE_OTHER): Payer: 59 | Admitting: Podiatry

## 2016-09-13 VITALS — BP 118/72 | HR 56 | Temp 97.2°F | Resp 16 | Ht 63.0 in | Wt 192.0 lb

## 2016-09-13 DIAGNOSIS — M79672 Pain in left foot: Secondary | ICD-10-CM | POA: Diagnosis not present

## 2016-09-13 DIAGNOSIS — L84 Corns and callosities: Secondary | ICD-10-CM | POA: Diagnosis not present

## 2016-09-13 DIAGNOSIS — M79671 Pain in right foot: Secondary | ICD-10-CM

## 2016-09-13 DIAGNOSIS — L851 Acquired keratosis [keratoderma] palmaris et plantaris: Secondary | ICD-10-CM

## 2016-09-13 NOTE — Progress Notes (Signed)
   Subjective:    Patient ID: Brittany Pruitt, female    DOB: December 01, 1960, 55 y.o.   MRN: [id number]  HPI    Review of Systems  HENT: Positive for hearing loss.   Musculoskeletal: Positive for arthralgias.  Neurological: Positive for headaches.       Objective:   Physical Exam        Assessment & Plan:

## 2016-09-14 NOTE — Progress Notes (Signed)
Patient ID: Brittany Pruitt, female   DOB: 1961/04/18, 55 y.o.   MRN: [id number] Subjective: Patient presents to the office today for chief complaint of painful callus lesions of the feet. Patient states that the pain is ongoing and is affecting their ability to ambulate without pain. Patient presents today for further treatment and evaluation.  Objective:  Physical Exam General: Alert and oriented x3 in no acute distress  Dermatology: Hyperkeratotic lesion present on the plantar aspect of the second MPJ left foot. Pain on palpation with a central nucleated core noted.  Skin is warm, dry and supple bilateral lower extremities. Negative for open lesions or macerations.  Vascular: Palpable pedal pulses bilaterally. No edema or erythema noted. Capillary refill within normal limits.  Neurological: Epicritic and protective threshold grossly intact bilaterally.   Musculoskeletal Exam: Pain on palpation at the keratotic lesion noted. Range of motion within normal limits bilateral. Muscle strength 5/5 in all groups bilateral.  Assessment: #1 porokeratosis second MPJ left foot #2 pain in left foot   Plan of Care:  #1 Patient evaluated #2 Excisional debridement of  keratoic lesion using a chisel blade was performed without incident.  #3 Treated area(s) with Salinocaine and dressed with light dressing. #4 Patient is to return to the clinic PRN.   Edrick Kins, [place]

## 2016-10-05 ENCOUNTER — Other Ambulatory Visit: Payer: Self-pay | Admitting: Internal Medicine

## 2016-10-07 ENCOUNTER — Other Ambulatory Visit: Payer: Self-pay | Admitting: Internal Medicine

## 2016-10-07 DIAGNOSIS — E1142 Type 2 diabetes mellitus with diabetic polyneuropathy: Secondary | ICD-10-CM

## 2016-10-07 DIAGNOSIS — Z794 Long term (current) use of insulin: Principal | ICD-10-CM

## 2017-01-02 DIAGNOSIS — E349 Endocrine disorder, unspecified: Secondary | ICD-10-CM | POA: Insufficient documentation

## 2017-01-02 DIAGNOSIS — G737 Myopathy in diseases classified elsewhere: Secondary | ICD-10-CM | POA: Insufficient documentation

## 2017-01-04 ENCOUNTER — Other Ambulatory Visit: Payer: Self-pay | Admitting: Internal Medicine

## 2017-01-06 NOTE — Telephone Encounter (Signed)
Letter mailed to pt letting her know her CPE is overdue

## 2017-01-24 ENCOUNTER — Other Ambulatory Visit: Payer: Self-pay

## 2017-02-28 ENCOUNTER — Encounter: Payer: Self-pay | Admitting: Internal Medicine

## 2017-02-28 ENCOUNTER — Ambulatory Visit (INDEPENDENT_AMBULATORY_CARE_PROVIDER_SITE_OTHER): Payer: 59 | Admitting: Internal Medicine

## 2017-02-28 VITALS — BP 116/70 | HR 69 | Temp 98.2°F | Wt 193.8 lb

## 2017-02-28 DIAGNOSIS — F32A Depression, unspecified: Secondary | ICD-10-CM

## 2017-02-28 DIAGNOSIS — E1165 Type 2 diabetes mellitus with hyperglycemia: Secondary | ICD-10-CM | POA: Diagnosis not present

## 2017-02-28 DIAGNOSIS — F419 Anxiety disorder, unspecified: Secondary | ICD-10-CM

## 2017-02-28 DIAGNOSIS — J383 Other diseases of vocal cords: Secondary | ICD-10-CM

## 2017-02-28 DIAGNOSIS — Z794 Long term (current) use of insulin: Secondary | ICD-10-CM

## 2017-02-28 DIAGNOSIS — I10 Essential (primary) hypertension: Secondary | ICD-10-CM | POA: Insufficient documentation

## 2017-02-28 DIAGNOSIS — E7800 Pure hypercholesterolemia, unspecified: Secondary | ICD-10-CM | POA: Diagnosis not present

## 2017-02-28 DIAGNOSIS — F329 Major depressive disorder, single episode, unspecified: Secondary | ICD-10-CM

## 2017-02-28 NOTE — Progress Notes (Signed)
Subjective:    Patient ID: Brittany Pruitt, female    DOB: Jun 04, 1961, 56 y.o.   MRN: [id number]  HPI  Pt presents to the clinic today to follow up chronic conditions.  Anxiety and Depression: Triggered by life stress. She is no longer taking the Wellbutrin. She feels she does not need it and it interrupted her sleep. 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  HTN: Her BP today is 116/70. She is taking Lisinopril daily. ECG from reviewed.  HLD: Her last LDL was 73, 02/2016. She is taking Zocor as prescribed. She denies myalgias. She tries to consume a low fat diet.  DM 2: Her last A1C was 8.8%, 06/2016. She is prescribed Glipizide, Lantus and Humalog. She stopped taking the Glipizide, because she felt like she didn't need it. Her fasting sugars range 170-210. Her nonfasting sugars range 95-125. She checks her feet daily. Her last eye exam was > 2 years ago. Flu never. Pneumovax 05/2015.  Review of Systems      Past Medical History:  Diagnosis Date  . Chicken pox   . Depression   . Diabetes mellitus without complication ([place])   . Frequent headaches     Current Outpatient Prescriptions  Medication Sig Dispense Refill  . buPROPion (WELLBUTRIN SR) 150 MG 12 hr tablet Take 1 tablet (150 mg total) by mouth 2 (two) times daily. 60 tablet 2  . clonazePAM (KLONOPIN) 0.5 MG tablet Take 0.5-1 tablets (0.25-0.5 mg total) by mouth 2 (two) times daily as needed. 45 tablet 0  . gabapentin (NEURONTIN) 100 MG capsule TAKE 1 CAPSULE BY MOUTH TWO TIMES DAILY 180 capsule 1  . glipiZIDE (GLUCOTROL XL) 10 MG 24 hr tablet Take 2 tablets (20 mg total) by mouth daily with breakfast. PLEASE SCHEDULE ANNUAL PHYSICAL EXAM 180 tablet 0  . Insulin Glargine (LANTUS SOLOSTAR) 100 UNIT/ML Solostar Pen Inject 34 Units into the skin daily at 10 pm. (Patient taking differently: Inject 38-40 Units into the skin daily at 10 pm. ) 12 pen 3  . Insulin Glargine (LANTUS SOLOSTAR) 100 UNIT/ML Solostar  Pen Inject 40 Units into the skin daily at 10 pm. 30 mL 1  . insulin lispro (HUMALOG) 100 UNIT/ML KiwkPen 8 units with breakfast, 10 units with lunch, 12 units with dinner 15 mL 11  . Insulin Pen Needle (PEN NEEDLES) 31G X 6 MM MISC 1 each by Does not apply route 2 (two) times daily. 200 each 4  . lisinopril (PRINIVIL,ZESTRIL) 5 MG tablet Take 1 tablet (5 mg total) by mouth daily. PLEASE SCHEDULE ANNUAL PHYSICAL EXAM 90 tablet 0  . ONE TOUCH ULTRA TEST test strip CHECK BLOOD SUGAR 4 TIMES  DAILY 200 each 11  . simvastatin (ZOCOR) 20 MG tablet Take 1 tablet (20 mg total) by mouth daily at 6 PM. PLEASE SCHEDULE ANNUAL PHYSICAL EXAM 90 tablet 0   No current facility-administered medications for this visit.     Allergies  Allergen Reactions  . Sulfa Antibiotics Hives    Family History  Problem Relation Age of Onset  . Uterine cancer Mother   . Breast cancer Mother 75  . Pancreatic cancer Mother   . Heart disease Father   . Hypertension Father   . Anxiety disorder Father   . Diabetes Father   . Uterine cancer Maternal Aunt   . Breast cancer Maternal Aunt 60  . Heart disease Paternal Grandfather     Social History   Social History  . Marital status: Married  Spouse name: N/A  . Number of children: N/A  . Years of education: N/A   Occupational History  . Not on file.   Social History Main Topics  . Smoking status: Current Every Day Smoker    Packs/day: 0.25    Years: 30.00    Types: Cigarettes  . Smokeless tobacco: Never Used  . Alcohol use No  . Drug use: No  . Sexual activity: No   Other Topics Concern  . Not on file   Social History Narrative  . No narrative on file     Constitutional: Denies fever, malaise, fatigue, headache or abrupt weight changes.  HEENT: Denies eye pain, eye redness, ear pain, ringing in the ears, wax buildup, runny nose, nasal congestion, bloody nose, or sore throat. Respiratory: Denies difficulty breathing, shortness of breath, cough  or sputum production.   Cardiovascular: Denies chest pain, chest tightness, palpitations or swelling in the hands or feet.  Gastrointestinal: Denies abdominal pain, bloating, constipation, diarrhea or blood in the stool.  Skin: Denies redness, rashes, lesions or ulcercations.  Neurological: Denies dizziness, difficulty with memory, difficulty with speech or problems with balance and coordination.  Psych: Pt has history of anxiety. Denies depression, SI/HI.  No other specific complaints in a complete review of systems (except as listed in HPI above).  Objective:   Physical Exam   BP 116/70   Pulse 69   Temp 98.2 F (36.8 C) (Oral)   Wt 193 lb 12 oz (87.9 kg)   SpO2 97%   BMI 34.32 kg/m  Wt Readings from Last 3 Encounters:  02/28/17 193 lb 12 oz (87.9 kg)  09/13/16 192 lb (87.1 kg)  08/27/16 197 lb (89.4 kg)    General: Appears her stated age, obese in NAD. Skin: Warm, dry and intact. No ulcerations noted. Cardiovascular: Normal rate and rhythm. S1,S2 noted.  No murmur, rubs or gallops noted.  Pulmonary/Chest: Normal effort and positive vesicular breath sounds. No respiratory distress. No wheezes, rales or ronchi noted.  Neurological: Alert and oriented. Sensation intact to BLE. Psychiatric: Mood and affect normal. Behavior is normal. Judgment and thought content normal.    BMET    Component Value Date/Time   NA 141 02/28/2017 1604   NA 137 05/06/2012 0949   K 4.7 02/28/2017 1604   K 4.5 05/06/2012 0949   CL 99 02/28/2017 1604   CL 103 05/06/2012 0949   CO2 26 02/28/2017 1604   CO2 27 05/06/2012 0949   GLUCOSE 63 (L) 02/28/2017 1604   GLUCOSE 123 (H) 05/06/2012 0949   BUN 10 02/28/2017 1604   BUN 10 05/06/2012 0949   CREATININE 0.65 02/28/2017 1604   CREATININE 0.73 05/06/2012 0949   CALCIUM 9.8 02/28/2017 1604   CALCIUM 9.2 05/06/2012 0949   GFRNONAA 99 02/28/2017 1604   GFRNONAA >60 05/06/2012 0949   GFRAA 114 02/28/2017 1604   GFRAA >60 05/06/2012 0949     Lipid Panel     Component Value Date/Time   CHOL 135 02/28/2017 1604   TRIG 135 02/28/2017 1604   HDL 38 (L) 02/28/2017 1604   CHOLHDL 3.6 02/28/2017 1604   LDLCALC 70 02/28/2017 1604    CBC    Component Value Date/Time   WBC 12.1 (H) 02/28/2017 1604   WBC 11.7 (H) 05/06/2012 0949   RBC 5.12 02/28/2017 1604   RBC 5.01 05/06/2012 0949   HGB 12.4 05/22/2012 0603   HCT 44.8 02/28/2017 1604   PLT 277 02/28/2017 1604   MCV 88 02/28/2017  1604   MCV 88 05/06/2012 0949   MCH 29.1 02/28/2017 1604   MCH 28.7 05/06/2012 0949   MCHC 33.3 02/28/2017 1604   MCHC 32.7 05/06/2012 0949   RDW 13.8 02/28/2017 1604   RDW 13.7 05/06/2012 0949   LYMPHSABS 5.1 (H) 02/28/2017 1604   EOSABS 0.1 02/28/2017 1604   BASOSABS 0.1 02/28/2017 1604    Hgb A1C Lab Results  Component Value Date   HGBA1C 8.6 (H) 02/28/2017           Assessment & Plan:

## 2017-02-28 NOTE — Patient Instructions (Signed)
Carbohydrate Counting for Diabetes Mellitus, Adult Carbohydrate counting is a method for keeping track of how many carbohydrates you eat. Eating carbohydrates naturally increases the amount of sugar (glucose) in the blood. Counting how many carbohydrates you eat helps keep your blood glucose within normal limits, which helps you manage your diabetes (diabetes mellitus). It is important to know how many carbohydrates you can safely have in each meal. This is different for every person. A diet and nutrition specialist (registered dietitian) can help you make a meal plan and calculate how many carbohydrates you should have at each meal and snack. Carbohydrates are found in the following foods:  Grains, such as breads and cereals.  Dried beans and soy products.  Starchy vegetables, such as potatoes, peas, and corn.  Fruit and fruit juices.  Milk and yogurt.  Sweets and snack foods, such as cake, cookies, candy, chips, and soft drinks. How do I count carbohydrates? There are two ways to count carbohydrates in food. You can use either of the methods or a combination of both. Reading "Nutrition Facts" on packaged food  The "Nutrition Facts" list is included on the labels of almost all packaged foods and beverages in the [place] It includes:  The serving size.  Information about nutrients in each serving, including the grams (g) of carbohydrate per serving. To use the "Nutrition Facts":  Decide how many servings you will have.  Multiply the number of servings by the number of carbohydrates per serving.  The resulting number is the total amount of carbohydrates that you will be having. Learning standard serving sizes of other foods  When you eat foods containing carbohydrates that are not packaged or do not include "Nutrition Facts" on the label, you need to measure the servings in order to count the amount of carbohydrates:  Measure the foods that you will eat with a food scale or measuring  cup, if needed.  Decide how many standard-size servings you will eat.  Multiply the number of servings by 15. Most carbohydrate-rich foods have about 15 g of carbohydrates per serving.  For example, if you eat 8 oz (170 g) of strawberries, you will have eaten 2 servings and 30 g of carbohydrates (2 servings x 15 g = 30 g).  For foods that have more than one food mixed, such as soups and casseroles, you must count the carbohydrates in each food that is included. The following list contains standard serving sizes of common carbohydrate-rich foods. Each of these servings has about 15 g of carbohydrates:   hamburger bun or  English muffin.   oz (15 mL) syrup.   oz (14 g) jelly.  1 slice of bread.  1 six-inch tortilla.  3 oz (85 g) cooked rice or pasta.  4 oz (113 g) cooked dried beans.  4 oz (113 g) starchy vegetable, such as peas, corn, or potatoes.  4 oz (113 g) hot cereal.  4 oz (113 g) mashed potatoes or  of a large baked potato.  4 oz (113 g) canned or frozen fruit.  4 oz (120 mL) fruit juice.  4-6 crackers.  6 chicken nuggets.  6 oz (170 g) unsweetened dry cereal.  6 oz (170 g) plain fat-free yogurt or yogurt sweetened with artificial sweeteners.  8 oz (240 mL) milk.  8 oz (170 g) fresh fruit or one small piece of fruit.  24 oz (680 g) popped popcorn. Example of carbohydrate counting Sample meal  3 oz (85 g) chicken breast.  6 oz (  170 g) brown rice.  4 oz (113 g) corn.  8 oz (240 mL) milk.  8 oz (170 g) strawberries with sugar-free whipped topping. Carbohydrate calculation 1. Identify the foods that contain carbohydrates:  Rice.  Corn.  Milk.  Strawberries. 2. Calculate how many servings you have of each food:  2 servings rice.  1 serving corn.  1 serving milk.  1 serving strawberries. 3. Multiply each number of servings by 15 g:  2 servings rice x 15 g = 30 g.  1 serving corn x 15 g = 15 g.  1 serving milk x 15 g = 15  g.  1 serving strawberries x 15 g = 15 g. 4. Add together all of the amounts to find the total grams of carbohydrates eaten:  30 g + 15 g + 15 g + 15 g = 75 g of carbohydrates total. This information is not intended to replace advice given to you by your health care provider. Make sure you discuss any questions you have with your health care provider. Document Released: 10/21/2005 Document Revised: 05/10/2016 Document Reviewed: 04/03/2016 Elsevier Interactive Patient Education  2017 [employer] Inc.  

## 2017-03-01 LAB — CBC WITH DIFFERENTIAL/PLATELET
Basophils Absolute: 0.1 10*3/uL (ref 0.0–0.2)
Basos: 0 %
EOS (ABSOLUTE): 0.1 10*3/uL (ref 0.0–0.4)
EOS: 1 %
HEMATOCRIT: 44.8 % (ref 34.0–46.6)
Hemoglobin: 14.9 g/dL (ref 11.1–15.9)
Immature Grans (Abs): 0 10*3/uL (ref 0.0–0.1)
Immature Granulocytes: 0 %
LYMPHS ABS: 5.1 10*3/uL — AB (ref 0.7–3.1)
Lymphs: 42 %
MCH: 29.1 pg (ref 26.6–33.0)
MCHC: 33.3 g/dL (ref 31.5–35.7)
MCV: 88 fL (ref 79–97)
MONOS ABS: 0.7 10*3/uL (ref 0.1–0.9)
Monocytes: 6 %
Neutrophils Absolute: 6.1 10*3/uL (ref 1.4–7.0)
Neutrophils: 51 %
Platelets: 277 10*3/uL (ref 150–379)
RBC: 5.12 x10E6/uL (ref 3.77–5.28)
RDW: 13.8 % (ref 12.3–15.4)
WBC: 12.1 10*3/uL — AB (ref 3.4–10.8)

## 2017-03-01 LAB — LIPID PANEL
CHOL/HDL RATIO: 3.6 ratio (ref 0.0–4.4)
Cholesterol, Total: 135 mg/dL (ref 100–199)
HDL: 38 mg/dL — AB (ref >39)
LDL Calculated: 70 mg/dL (ref 0–99)
TRIGLYCERIDES: 135 mg/dL (ref 0–149)
VLDL CHOLESTEROL CAL: 27 mg/dL (ref 5–40)

## 2017-03-01 LAB — COMPREHENSIVE METABOLIC PANEL
A/G RATIO: 1.8 (ref 1.2–2.2)
ALK PHOS: 55 IU/L (ref 39–117)
ALT: 14 IU/L (ref 0–32)
AST: 11 IU/L (ref 0–40)
Albumin: 4.4 g/dL (ref 3.5–5.5)
BILIRUBIN TOTAL: 0.4 mg/dL (ref 0.0–1.2)
BUN/Creatinine Ratio: 15 (ref 9–23)
BUN: 10 mg/dL (ref 6–24)
CHLORIDE: 99 mmol/L (ref 96–106)
CO2: 26 mmol/L (ref 18–29)
Calcium: 9.8 mg/dL (ref 8.7–10.2)
Creatinine, Ser: 0.65 mg/dL (ref 0.57–1.00)
GFR calc non Af Amer: 99 mL/min/{1.73_m2} (ref >59)
GFR, EST AFRICAN AMERICAN: 114 mL/min/{1.73_m2} (ref >59)
Globulin, Total: 2.4 g/dL (ref 1.5–4.5)
Glucose: 63 mg/dL — ABNORMAL LOW (ref 65–99)
POTASSIUM: 4.7 mmol/L (ref 3.5–5.2)
Sodium: 141 mmol/L (ref 134–144)
Total Protein: 6.8 g/dL (ref 6.0–8.5)

## 2017-03-01 LAB — HEMOGLOBIN A1C
Est. average glucose Bld gHb Est-mCnc: 200 mg/dL
Hgb A1c MFr Bld: 8.6 % — ABNORMAL HIGH (ref 4.8–5.6)

## 2017-03-03 NOTE — Assessment & Plan Note (Signed)
A1C and lipid profile today Microalbumin not needed secondary to ACEI therapy Encouraged her to consume a low fat, low carb diet and increase aerobic exercise Foot exam today Advised her to make an appt for an eye exam Encouraged her to get a flu shot in the fall Pneumovax UTD If A1C > 8%, will restart Glipizide. Continue Lantus and Humalog at current dose for now

## 2017-03-03 NOTE — Assessment & Plan Note (Signed)
CMET and Lipid profile today Encouraged her to consume a low fat diet Continue Zocor, will adjust if needed

## 2017-03-03 NOTE — Assessment & Plan Note (Signed)
Stable Will monitor for now

## 2017-03-03 NOTE — Assessment & Plan Note (Signed)
Controlled on Lisinopril CMET today 

## 2017-03-03 NOTE — Assessment & Plan Note (Signed)
Controlled with Clonazepam

## 2017-03-04 MED ORDER — SIMVASTATIN 20 MG PO TABS: 20.0000 mg | ORAL_TABLET | Freq: Every day | ORAL | 1 refills | Status: DC

## 2017-03-04 MED ORDER — INSULIN LISPRO 100 UNIT/ML (KWIKPEN): PEN_INJECTOR | SUBCUTANEOUS | 1 refills | Status: DC

## 2017-03-04 MED ORDER — PEN NEEDLES 31G X 6 MM MISC: 1.0000 | Freq: Two times a day (BID) | 4 refills | Status: DC

## 2017-03-04 MED ORDER — GLIPIZIDE 10 MG PO TABS: 10.0000 mg | ORAL_TABLET | Freq: Two times a day (BID) | ORAL | 1 refills | Status: DC

## 2017-03-04 MED ORDER — LISINOPRIL 5 MG PO TABS: 5.0000 mg | ORAL_TABLET | Freq: Every day | ORAL | 1 refills | Status: DC

## 2017-03-04 NOTE — Addendum Note (Signed)
Addended by: Lurlean Nanny on: 03/04/2017 04:39 PM   Modules accepted: Orders

## 2017-03-05 ENCOUNTER — Telehealth: Payer: Self-pay | Admitting: Internal Medicine

## 2017-03-05 ENCOUNTER — Other Ambulatory Visit: Payer: Self-pay | Admitting: Internal Medicine

## 2017-03-05 MED ORDER — PEN NEEDLES 31G X 6 MM MISC: 1.0000 | Freq: Four times a day (QID) | 1 refills | Status: DC

## 2017-03-05 MED ORDER — INSULIN PEN NEEDLE 29G X 12.7MM MISC: 1.0000 | Freq: Four times a day (QID) | 1 refills | Status: DC

## 2017-03-05 NOTE — Telephone Encounter (Signed)
Last filled 06/25/16--please advise

## 2017-03-06 ENCOUNTER — Telehealth: Payer: Self-pay

## 2017-03-06 MED ORDER — CLONAZEPAM 0.5 MG PO TABS: 0.2500 mg | ORAL_TABLET | Freq: Two times a day (BID) | ORAL | 0 refills | Status: DC | PRN

## 2017-03-06 NOTE — Telephone Encounter (Signed)
Can you print this so that I can sign, and then you can fax to [facility]?

## 2017-03-06 NOTE — Telephone Encounter (Signed)
Rx faxed to mail order 

## 2017-03-06 NOTE — Telephone Encounter (Signed)
Brittany Pruitt with [facility] left v/m; [facility] wants to verify rx sent for glipizide 10 mg was correct; previously pt taking glipizide XL (extended release). Request cb; pharmacy order # [id number]

## 2017-03-07 NOTE — Telephone Encounter (Signed)
Called mail order and they are aware of the change and will d/c the previous Rx

## 2017-03-09 ENCOUNTER — Other Ambulatory Visit: Payer: Self-pay | Admitting: Internal Medicine

## 2017-03-09 DIAGNOSIS — Z794 Long term (current) use of insulin: Principal | ICD-10-CM

## 2017-03-09 DIAGNOSIS — E1142 Type 2 diabetes mellitus with diabetic polyneuropathy: Secondary | ICD-10-CM

## 2017-05-16 ENCOUNTER — Other Ambulatory Visit: Payer: Self-pay

## 2017-05-16 MED ORDER — CLONAZEPAM 0.5 MG PO TABS: 0.2500 mg | ORAL_TABLET | Freq: Two times a day (BID) | ORAL | 0 refills | Status: DC | PRN

## 2017-05-16 NOTE — Telephone Encounter (Signed)
Form signed and placed in MYD box 

## 2017-05-16 NOTE — Telephone Encounter (Signed)
Last filled 03/06/17 #45 to mail order... Please advise... Fax placed in your box for signature if approved

## 2017-05-19 NOTE — Telephone Encounter (Signed)
Rx faxed to [facility].  

## 2017-06-04 DIAGNOSIS — R2689 Other abnormalities of gait and mobility: Secondary | ICD-10-CM | POA: Insufficient documentation

## 2017-06-04 DIAGNOSIS — R52 Pain, unspecified: Secondary | ICD-10-CM | POA: Insufficient documentation

## 2017-06-16 ENCOUNTER — Encounter: Payer: Self-pay | Admitting: Internal Medicine

## 2017-06-16 ENCOUNTER — Ambulatory Visit (INDEPENDENT_AMBULATORY_CARE_PROVIDER_SITE_OTHER): Payer: 59 | Admitting: Internal Medicine

## 2017-06-16 VITALS — BP 116/78 | HR 68 | Temp 98.3°F | Ht 65.0 in | Wt 199.5 lb

## 2017-06-16 DIAGNOSIS — Z23 Encounter for immunization: Secondary | ICD-10-CM

## 2017-06-16 DIAGNOSIS — Z0001 Encounter for general adult medical examination with abnormal findings: Secondary | ICD-10-CM | POA: Diagnosis not present

## 2017-06-16 DIAGNOSIS — Z794 Long term (current) use of insulin: Secondary | ICD-10-CM

## 2017-06-16 DIAGNOSIS — E1165 Type 2 diabetes mellitus with hyperglycemia: Secondary | ICD-10-CM | POA: Diagnosis not present

## 2017-06-16 NOTE — Patient Instructions (Signed)
Health Maintenance for Postmenopausal Women Menopause is a normal process in which your reproductive ability comes to an end. This process happens gradually over a span of months to years, usually between the ages of 22 and 9. Menopause is complete when you have missed 12 consecutive menstrual periods. It is important to talk with your health care provider about some of the most common conditions that affect postmenopausal women, such as heart disease, cancer, and bone loss (osteoporosis). Adopting a healthy lifestyle and getting preventive care can help to promote your health and wellness. Those actions can also lower your chances of developing some of these common conditions. What should I know about menopause? During menopause, you may experience a number of symptoms, such as:  Moderate-to-severe hot flashes.  Night sweats.  Decrease in sex drive.  Mood swings.  Headaches.  Tiredness.  Irritability.  Memory problems.  Insomnia.  Choosing to treat or not to treat menopausal changes is an individual decision that you make with your health care provider. What should I know about hormone replacement therapy and supplements? Hormone therapy products are effective for treating symptoms that are associated with menopause, such as hot flashes and night sweats. Hormone replacement carries certain risks, especially as you become older. If you are thinking about using estrogen or estrogen with progestin treatments, discuss the benefits and risks with your health care provider. What should I know about heart disease and stroke? Heart disease, heart attack, and stroke become more likely as you age. This may be due, in part, to the hormonal changes that your body experiences during menopause. These can affect how your body processes dietary fats, triglycerides, and cholesterol. Heart attack and stroke are both medical emergencies. There are many things that you can do to help prevent heart disease  and stroke:  Have your blood pressure checked at least every 1-2 years. High blood pressure causes heart disease and increases the risk of stroke.  If you are 53-22 years old, ask your health care provider if you should take aspirin to prevent a heart attack or a stroke.  Do not use any tobacco products, including cigarettes, chewing tobacco, or electronic cigarettes. If you need help quitting, ask your health care provider.  It is important to eat a healthy diet and maintain a healthy weight. ? Be sure to include plenty of vegetables, fruits, low-fat dairy products, and lean protein. ? Avoid eating foods that are high in solid fats, added sugars, or salt (sodium).  Get regular exercise. This is one of the most important things that you can do for your health. ? Try to exercise for at least 150 minutes each week. The type of exercise that you do should increase your heart rate and make you sweat. This is known as moderate-intensity exercise. ? Try to do strengthening exercises at least twice each week. Do these in addition to the moderate-intensity exercise.  Know your numbers.Ask your health care provider to check your cholesterol and your blood glucose. Continue to have your blood tested as directed by your health care provider.  What should I know about cancer screening? There are several types of cancer. Take the following steps to reduce your risk and to catch any cancer development as early as possible. Breast Cancer  Practice breast self-awareness. ? This means understanding how your breasts normally appear and feel. ? It also means doing regular breast self-exams. Let your health care provider know about any changes, no matter how small.  If you are 40  or older, have a clinician do a breast exam (clinical breast exam or CBE) every year. Depending on your age, family history, and medical history, it may be recommended that you also have a yearly breast X-ray (mammogram).  If you  have a family history of breast cancer, talk with your health care provider about genetic screening.  If you are at high risk for breast cancer, talk with your health care provider about having an MRI and a mammogram every year.  Breast cancer (BRCA) gene test is recommended for women who have family members with BRCA-related cancers. Results of the assessment will determine the need for genetic counseling and BRCA1 and for BRCA2 testing. BRCA-related cancers include these types: ? Breast. This occurs in males or females. ? Ovarian. ? Tubal. This may also be called fallopian tube cancer. ? Cancer of the abdominal or pelvic lining (peritoneal cancer). ? Prostate. ? Pancreatic.  Cervical, Uterine, and Ovarian Cancer Your health care provider may recommend that you be screened regularly for cancer of the pelvic organs. These include your ovaries, uterus, and vagina. This screening involves a pelvic exam, which includes checking for microscopic changes to the surface of your cervix (Pap test).  For women ages 21-65, health care providers may recommend a pelvic exam and a Pap test every three years. For women ages 79-65, they may recommend the Pap test and pelvic exam, combined with testing for human papilloma virus (HPV), every five years. Some types of HPV increase your risk of cervical cancer. Testing for HPV may also be done on women of any age who have unclear Pap test results.  Other health care providers may not recommend any screening for nonpregnant women who are considered low risk for pelvic cancer and have no symptoms. Ask your health care provider if a screening pelvic exam is right for you.  If you have had past treatment for cervical cancer or a condition that could lead to cancer, you need Pap tests and screening for cancer for at least 20 years after your treatment. If Pap tests have been discontinued for you, your risk factors (such as having a new sexual partner) need to be  reassessed to determine if you should start having screenings again. Some women have medical problems that increase the chance of getting cervical cancer. In these cases, your health care provider may recommend that you have screening and Pap tests more often.  If you have a family history of uterine cancer or ovarian cancer, talk with your health care provider about genetic screening.  If you have vaginal bleeding after reaching menopause, tell your health care provider.  There are currently no reliable tests available to screen for ovarian cancer.  Lung Cancer Lung cancer screening is recommended for adults 69-62 years old who are at high risk for lung cancer because of a history of smoking. A yearly low-dose CT scan of the lungs is recommended if you:  Currently smoke.  Have a history of at least 30 pack-years of smoking and you currently smoke or have quit within the past 15 years. A pack-year is smoking an average of one pack of cigarettes per day for one year.  Yearly screening should:  Continue until it has been 15 years since you quit.  Stop if you develop a health problem that would prevent you from having lung cancer treatment.  Colorectal Cancer  This type of cancer can be detected and can often be prevented.  Routine colorectal cancer screening usually begins at  age 42 and continues through age 45.  If you have risk factors for colon cancer, your health care provider may recommend that you be screened at an earlier age.  If you have a family history of colorectal cancer, talk with your health care provider about genetic screening.  Your health care provider may also recommend using home test kits to check for hidden blood in your stool.  A small camera at the end of a tube can be used to examine your colon directly (sigmoidoscopy or colonoscopy). This is done to check for the earliest forms of colorectal cancer.  Direct examination of the colon should be repeated every  5-10 years until age 71. However, if early forms of precancerous polyps or small growths are found or if you have a family history or genetic risk for colorectal cancer, you may need to be screened more often.  Skin Cancer  Check your skin from head to toe regularly.  Monitor any moles. Be sure to tell your health care provider: ? About any new moles or changes in moles, especially if there is a change in a mole's shape or color. ? If you have a mole that is larger than the size of a pencil eraser.  If any of your family members has a history of skin cancer, especially at a young age, talk with your health care provider about genetic screening.  Always use sunscreen. Apply sunscreen liberally and repeatedly throughout the day.  Whenever you are outside, protect yourself by wearing long sleeves, pants, a wide-brimmed hat, and sunglasses.  What should I know about osteoporosis? Osteoporosis is a condition in which bone destruction happens more quickly than new bone creation. After menopause, you may be at an increased risk for osteoporosis. To help prevent osteoporosis or the bone fractures that can happen because of osteoporosis, the following is recommended:  If you are 46-71 years old, get at least 1,000 mg of calcium and at least 600 mg of vitamin D per day.  If you are older than age 55 but younger than age 65, get at least 1,200 mg of calcium and at least 600 mg of vitamin D per day.  If you are older than age 54, get at least 1,200 mg of calcium and at least 800 mg of vitamin D per day.  Smoking and excessive alcohol intake increase the risk of osteoporosis. Eat foods that are rich in calcium and vitamin D, and do weight-bearing exercises several times each week as directed by your health care provider. What should I know about how menopause affects my mental health? Depression may occur at any age, but it is more common as you become older. Common symptoms of depression  include:  Low or sad mood.  Changes in sleep patterns.  Changes in appetite or eating patterns.  Feeling an overall lack of motivation or enjoyment of activities that you previously enjoyed.  Frequent crying spells.  Talk with your health care provider if you think that you are experiencing depression. What should I know about immunizations? It is important that you get and maintain your immunizations. These include:  Tetanus, diphtheria, and pertussis (Tdap) booster vaccine.  Influenza every year before the flu season begins.  Pneumonia vaccine.  Shingles vaccine.  Your health care provider may also recommend other immunizations. This information is not intended to replace advice given to you by your health care provider. Make sure you discuss any questions you have with your health care provider. Document Released: 12/13/2005  Document Revised: 05/10/2016 Document Reviewed: 07/25/2015 Elsevier Interactive Patient Education  2018 [employer].  

## 2017-06-16 NOTE — Addendum Note (Signed)
Addended by: Lurlean Nanny on: 06/16/2017 05:38 PM   Modules accepted: Orders

## 2017-06-16 NOTE — Progress Notes (Signed)
Subjective:    Patient ID: Brittany Pruitt, female    DOB: [birth year], 56 y.o.   MRN: [id number]  HPI  Pt presents to the clinic today for her annual exam. She is also due to follow up DM 2.  DM 2: Her last A1C was 9.2, 06/2017. She is taking Lantus, Humalog and Glipizide as prescribed. Her fasting sugars run around 200. Her last eye exam was last year, she is scheduled for one Wednesday. She checks her feet daily.  Flu: 08/2015 Tetanus: > 10 years ago Prevnar: 05/2015 Mammogram: 11/2015, she will call to schedule Pap Smear: 2013, s/p total hysterectomy Colon Screening: 02/2016 Vision Screening: annually, has appt Wednesday Dentist: as needed  Diet: She does eat meat. She consume fruits and veggies. She tries to avoid fried food. She drinks mostly coffee or water. Exercise: None   Review of Systems      Past Medical History:  Diagnosis Date  . Chicken pox   . Depression   . Diabetes mellitus without complication ([place])   . Frequent headaches     Current Outpatient Prescriptions  Medication Sig Dispense Refill  . clonazePAM (KLONOPIN) 0.5 MG tablet Take 0.5-1 tablets (0.25-0.5 mg total) by mouth 2 (two) times daily as needed. 45 tablet 0  . gabapentin (NEURONTIN) 100 MG capsule TAKE 1 CAPSULE BY MOUTH TWO TIMES DAILY 180 capsule 1  . glipiZIDE (GLUCOTROL) 10 MG tablet Take 1 tablet (10 mg total) by mouth 2 (two) times daily before a meal. 180 tablet 1  . insulin lispro (HUMALOG) 100 UNIT/ML KiwkPen 8 units with breakfast, 10 units with lunch, 12 units with dinner 30 mL 1  . Insulin Pen Needle 29G X 12.7MM MISC 1 each by Does not apply route 4 (four) times daily. 400 each 1  . LANTUS SOLOSTAR 100 UNIT/ML Solostar Pen INJECT SUBCUTANEOUSLY 40  UNITS DAILY AT 10PM 45 mL 1  . lisinopril (PRINIVIL,ZESTRIL) 5 MG tablet Take 1 tablet (5 mg total) by mouth daily. 90 tablet 1  . ONE TOUCH ULTRA TEST test strip CHECK BLOOD SUGAR 4 TIMES  DAILY 200 each 11  . simvastatin (ZOCOR) 20 MG  tablet Take 1 tablet (20 mg total) by mouth daily at 6 PM. 90 tablet 1   No current facility-administered medications for this visit.     Allergies  Allergen Reactions  . Sulfa Antibiotics Hives    Family History  Problem Relation Age of Onset  . Uterine cancer Mother   . Breast cancer Mother 37  . Pancreatic cancer Mother   . Heart disease Father   . Hypertension Father   . Anxiety disorder Father   . Diabetes Father   . Uterine cancer Maternal Aunt   . Breast cancer Maternal Aunt 60  . Heart disease Paternal Grandfather     Social History   Social History  . Marital status: Married    Spouse name: N/A  . Number of children: N/A  . Years of education: N/A   Occupational History  . Not on file.   Social History Main Topics  . Smoking status: Current Every Day Smoker    Packs/day: 0.25    Years: 30.00    Types: Cigarettes  . Smokeless tobacco: Never Used  . Alcohol use No  . Drug use: No  . Sexual activity: No   Other Topics Concern  . Not on file   Social History Narrative  . No narrative on file     Constitutional: Pt reports fatigue  and weight gain. Denies fever, malaise, fatigue, headache.  HEENT: Denies eye pain, eye redness, ear pain, ringing in the ears, wax buildup, runny nose, nasal congestion, bloody nose, or sore throat. Respiratory: Denies difficulty breathing, shortness of breath, cough or sputum production.   Cardiovascular: Denies chest pain, chest tightness, palpitations or swelling in the hands or feet.  Gastrointestinal: Denies abdominal pain, bloating, constipation, diarrhea or blood in the stool.  GU: Denies urgency, frequency, pain with urination, burning sensation, blood in urine, odor or discharge. Musculoskeletal: Pt reports joint pains. Denies decrease in range of motion, difficulty with gait, muscle pain or joint swelling.  Skin: Denies redness, rashes, lesions or ulcercations.  Neurological: Denies dizziness, difficulty with  memory, difficulty with speech or problems with balance and coordination.  Psych: Pt has history of anxiety. Denies depression, SI/HI.  No other specific complaints in a complete review of systems (except as listed in HPI above).  Objective:   Physical Exam   BP 116/78   Pulse 68   Temp 98.3 F (36.8 C) (Oral)   Ht 5\' 5"  (1.651 m)   Wt 199 lb 8 oz (90.5 kg)   SpO2 97%   BMI 33.20 kg/m   Wt Readings from Last 3 Encounters:  06/16/17 199 lb 8 oz (90.5 kg)  02/28/17 193 lb 12 oz (87.9 kg)  09/13/16 192 lb (87.1 kg)    General: Appears her stated age, obese in NAD. Skin: Warm, dry and intact. No  ulcerations noted. HEENT: Head: normal shape and size; Eyes: sclera white, no icterus, conjunctiva pink, PERRLA and EOMs intact; Ears: Tm's gray and intact, normal light reflex; Nose: mucosa pink and moist, septum midline; Throat/Mouth: Teeth present, mucosa pink and moist, no exudate, lesions or ulcerations noted.  Neck:  Neck supple, trachea midline. No masses, lumps or thyromegaly present.  Cardiovascular: Normal rate and rhythm. S1,S2 noted.  No murmur, rubs or gallops noted. No JVD or BLE edema. No carotid bruits noted. Pulmonary/Chest: Normal effort and positive vesicular breath sounds. No respiratory distress. No wheezes, rales or ronchi noted.  Abdomen: Soft and nontender. Normal bowel sounds. No distention or masses noted. Liver, spleen and kidneys non palpable. Musculoskeletal: Strength 5/5 BUE/BLE. No difficulty with gait.  Neurological: Alert and oriented. Cranial nerves II-XII grossly intact. Coordination normal.  Psychiatric: Mood and affect normal. Behavior is normal. Judgment and thought content normal.     BMET    Component Value Date/Time   NA 141 02/28/2017 1604   NA 137 05/06/2012 0949   K 4.7 02/28/2017 1604   K 4.5 05/06/2012 0949   CL 99 02/28/2017 1604   CL 103 05/06/2012 0949   CO2 26 02/28/2017 1604   CO2 27 05/06/2012 0949   GLUCOSE 63 (L) 02/28/2017  1604   GLUCOSE 123 (H) 05/06/2012 0949   BUN 10 02/28/2017 1604   BUN 10 05/06/2012 0949   CREATININE 0.65 02/28/2017 1604   CREATININE 0.73 05/06/2012 0949   CALCIUM 9.8 02/28/2017 1604   CALCIUM 9.2 05/06/2012 0949   GFRNONAA 99 02/28/2017 1604   GFRNONAA >60 05/06/2012 0949   GFRAA 114 02/28/2017 1604   GFRAA >60 05/06/2012 0949    Lipid Panel     Component Value Date/Time   CHOL 135 02/28/2017 1604   TRIG 135 02/28/2017 1604   HDL 38 (L) 02/28/2017 1604   CHOLHDL 3.6 02/28/2017 1604   LDLCALC 70 02/28/2017 1604    CBC    Component Value Date/Time   WBC 12.1 (H)  02/28/2017 1604   WBC 11.7 (H) 05/06/2012 0949   RBC 5.12 02/28/2017 1604   RBC 5.01 05/06/2012 0949   HGB 14.9 02/28/2017 1604   HCT 44.8 02/28/2017 1604   PLT 277 02/28/2017 1604   MCV 88 02/28/2017 1604   MCV 88 05/06/2012 0949   MCH 29.1 02/28/2017 1604   MCH 28.7 05/06/2012 0949   MCHC 33.3 02/28/2017 1604   MCHC 32.7 05/06/2012 0949   RDW 13.8 02/28/2017 1604   RDW 13.7 05/06/2012 0949   LYMPHSABS 5.1 (H) 02/28/2017 1604   EOSABS 0.1 02/28/2017 1604   BASOSABS 0.1 02/28/2017 1604    Hgb A1C Lab Results  Component Value Date   HGBA1C 8.6 (H) 02/28/2017           Assessment & Plan:   Preventative Health Maintenance:  Encouraged her to get a flu shot in the fall Tdap today Prevnar UTD She will call to schedule her mammogram She is not a candidate for pap smears Her colon cancer screening is UTD Encouraged her to consume a balanced diet and exercise regimen Advised her to see an eye doctor an dentist annually Will check CBC, CMET, TSH and Vit D today.  She will get me a copy of lipid and A1C  RTC in 3 months follow up DM 2 Bowie Delia, NP

## 2017-06-16 NOTE — Assessment & Plan Note (Signed)
Increase Lantus to 50 units daily Continue Humalog and Glipizide for now Start checking sugars 4 x day Consume a low carb, low fat diet and exercise for weight loss Foot exam today She has her eye exam scheduled

## 2017-06-17 LAB — TSH: TSH: 1.17 u[IU]/mL (ref 0.450–4.500)

## 2017-06-17 LAB — CBC
Hematocrit: 45 % (ref 34.0–46.6)
Hemoglobin: 14.7 g/dL (ref 11.1–15.9)
MCH: 29 pg (ref 26.6–33.0)
MCHC: 32.7 g/dL (ref 31.5–35.7)
MCV: 89 fL (ref 79–97)
PLATELETS: 285 10*3/uL (ref 150–379)
RBC: 5.07 x10E6/uL (ref 3.77–5.28)
RDW: 13.9 % (ref 12.3–15.4)
WBC: 12 10*3/uL — ABNORMAL HIGH (ref 3.4–10.8)

## 2017-06-17 LAB — VITAMIN D 25 HYDROXY (VIT D DEFICIENCY, FRACTURES): Vit D, 25-Hydroxy: 21.7 ng/mL — ABNORMAL LOW (ref 30.0–100.0)

## 2017-06-17 LAB — SPECIMEN STATUS REPORT

## 2017-06-17 LAB — VITAMIN B12: Vitamin B-12: 180 pg/mL — ABNORMAL LOW (ref 232–1245)

## 2017-06-18 ENCOUNTER — Encounter: Payer: Self-pay | Admitting: Internal Medicine

## 2017-06-19 MED ORDER — VITAMIN D (ERGOCALCIFEROL) 1.25 MG (50000 UNIT) PO CAPS: 50000.0000 [IU] | ORAL_CAPSULE | ORAL | 0 refills | Status: DC

## 2017-06-20 MED ORDER — VITAMIN D (ERGOCALCIFEROL) 1.25 MG (50000 UNIT) PO CAPS
50000.0000 [IU] | ORAL_CAPSULE | ORAL | 0 refills | Status: DC
Start: 2017-06-20 — End: 2018-03-16

## 2017-06-28 ENCOUNTER — Other Ambulatory Visit: Payer: Self-pay | Admitting: Internal Medicine

## 2017-07-02 ENCOUNTER — Other Ambulatory Visit: Payer: Self-pay

## 2017-07-02 MED ORDER — CLONAZEPAM 0.5 MG PO TABS: 0.2500 mg | ORAL_TABLET | Freq: Two times a day (BID) | ORAL | 0 refills | Status: DC | PRN

## 2017-07-02 NOTE — Telephone Encounter (Signed)
Ok to phone in Clonazepam. Check CSA and UDS

## 2017-07-02 NOTE — Telephone Encounter (Signed)
Last filled 05/16/17.Marland KitchenMarland Kitchenplease advise

## 2017-07-02 NOTE — Telephone Encounter (Signed)
RX printed and signed and placed in MYD box 

## 2017-07-03 NOTE — Telephone Encounter (Signed)
Rx faxed to mail order 

## 2017-07-04 ENCOUNTER — Other Ambulatory Visit: Payer: Self-pay | Admitting: Internal Medicine

## 2017-07-05 ENCOUNTER — Encounter: Payer: Self-pay | Admitting: Internal Medicine

## 2017-07-23 ENCOUNTER — Encounter: Payer: Self-pay | Admitting: Internal Medicine

## 2017-08-16 ENCOUNTER — Other Ambulatory Visit: Payer: Self-pay | Admitting: Internal Medicine

## 2017-10-01 ENCOUNTER — Other Ambulatory Visit: Payer: Self-pay

## 2017-10-01 MED ORDER — CLONAZEPAM 0.5 MG PO TABS: 0.2500 mg | ORAL_TABLET | Freq: Two times a day (BID) | ORAL | 0 refills | Status: DC | PRN

## 2017-10-01 NOTE — Telephone Encounter (Signed)
Last refilled 07/02/17... Please advise--- print to fax to mail order

## 2017-10-01 NOTE — Telephone Encounter (Signed)
RX printed and signed and placed in MYD box 

## 2017-10-02 NOTE — Telephone Encounter (Signed)
Rx faxed to mail order 

## 2017-11-18 ENCOUNTER — Other Ambulatory Visit: Payer: Self-pay | Admitting: Internal Medicine

## 2017-11-18 DIAGNOSIS — Z794 Long term (current) use of insulin: Principal | ICD-10-CM

## 2017-11-18 DIAGNOSIS — E1142 Type 2 diabetes mellitus with diabetic polyneuropathy: Secondary | ICD-10-CM

## 2018-01-07 ENCOUNTER — Other Ambulatory Visit: Payer: Self-pay | Admitting: Internal Medicine

## 2018-01-08 NOTE — Telephone Encounter (Signed)
Last filled 10/01/2017... Please advise

## 2018-02-02 DIAGNOSIS — E349 Endocrine disorder, unspecified: Secondary | ICD-10-CM | POA: Insufficient documentation

## 2018-02-25 ENCOUNTER — Other Ambulatory Visit: Payer: Self-pay | Admitting: Internal Medicine

## 2018-03-09 ENCOUNTER — Encounter: Payer: Self-pay | Admitting: Internal Medicine

## 2018-03-16 ENCOUNTER — Ambulatory Visit: Payer: 59 | Admitting: Internal Medicine

## 2018-03-16 ENCOUNTER — Ambulatory Visit: Payer: Managed Care, Other (non HMO) | Admitting: Internal Medicine

## 2018-03-16 VITALS — BP 140/78 | HR 84 | Temp 98.3°F | Resp 20 | Ht 65.0 in | Wt 202.0 lb

## 2018-03-16 DIAGNOSIS — M791 Myalgia, unspecified site: Secondary | ICD-10-CM

## 2018-03-16 DIAGNOSIS — F419 Anxiety disorder, unspecified: Secondary | ICD-10-CM

## 2018-03-16 DIAGNOSIS — M2550 Pain in unspecified joint: Secondary | ICD-10-CM

## 2018-03-16 DIAGNOSIS — E1140 Type 2 diabetes mellitus with diabetic neuropathy, unspecified: Secondary | ICD-10-CM

## 2018-03-16 DIAGNOSIS — F410 Panic disorder [episodic paroxysmal anxiety] without agoraphobia: Secondary | ICD-10-CM

## 2018-03-16 DIAGNOSIS — R42 Dizziness and giddiness: Secondary | ICD-10-CM

## 2018-03-16 DIAGNOSIS — E1165 Type 2 diabetes mellitus with hyperglycemia: Secondary | ICD-10-CM

## 2018-03-16 DIAGNOSIS — R202 Paresthesia of skin: Secondary | ICD-10-CM | POA: Diagnosis not present

## 2018-03-16 DIAGNOSIS — R531 Weakness: Secondary | ICD-10-CM

## 2018-03-16 DIAGNOSIS — IMO0002 Reserved for concepts with insufficient information to code with codable children: Secondary | ICD-10-CM

## 2018-03-16 DIAGNOSIS — R5383 Other fatigue: Secondary | ICD-10-CM

## 2018-03-16 DIAGNOSIS — F329 Major depressive disorder, single episode, unspecified: Secondary | ICD-10-CM

## 2018-03-16 NOTE — Progress Notes (Signed)
Subjective:    Patient ID: Brittany Pruitt, female    DOB: [birth year], 59 y.o.   MRN: [id number]  HPI  Pt presents to the clinic today with multiple complaints.   She reports pain, numbness and tingling in her hands and feet. She describes the pain as burning. She does have some associated weakness in her upper and lower extremities. She has experienced 2 falls lately that she attributes to the weakness. She denies any injury to the fall.. She is having a burning pain in her neck and upper shoulders, but thinks this is muscular. She denies neck or shoulder pain. She reports her body aches all over, especially her legs at night. She can not sleep and is very restless due to the body aches. She does have a history of DM 2, last A1C was 8.6%, 02/2017. She reports her sugars are controlled during the day, but it seems more elevated at night. She takes 50 units of Lantus at night. She takes Humalog per sliding scale (8,10,12). She is taking Glipizide as well. She is taking Gabapentin as prescribed.   She also report when she stands up, she gets very dizzy. She denies visual changes, chest pain, shortness of breath or near syncopal episodes. Her BP today is 140/78. She is on Lisinopril for renal protections. She reports she drinks some water but probably doesn't drink enough.  She also reports increased anxiety, depression and panic attacks. She is more irritable than usual, losing patience more than she should. 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 She is not currently taking anything for depression at this time. She denies SI/HI.  Review of Systems      Past Medical History:  Diagnosis Date  . Chicken pox   . Depression   . Diabetes mellitus without complication ([place])   . Frequent headaches     Current Outpatient Medications  Medication Sig Dispense Refill  . B-D ULTRAFINE III SHORT PEN 31G X 8 MM MISC USE 1 PEN NEEDLE 4 TIMES  DAILY 80 each 2  . clonazePAM (KLONOPIN)  0.5 MG tablet TAKE 1/2 TO 1 TABLET BY  MOUTH TWO TIMES DAILY AS  NEEDED 45 tablet 0  . gabapentin (NEURONTIN) 100 MG capsule TAKE 1 CAPSULE BY MOUTH TWO TIMES DAILY 180 capsule 1  . glipiZIDE (GLUCOTROL) 10 MG tablet TAKE 1 TABLET BY MOUTH 2  TIMES DAILY BEFORE A MEAL. 180 tablet 1  . HUMALOG KWIKPEN 100 UNIT/ML KiwkPen INJECT SUBCUTANEOUSLY 8  UNITS WITH BREAKFAST, 10  UNITS WITH LUNCH, 12 UNITS  WITH DINNER 30 mL 1  . Insulin Pen Needle 29G X 12.7MM MISC 1 each by Does not apply route 4 (four) times daily. 400 each 1  . LANTUS SOLOSTAR 100 UNIT/ML Solostar Pen INJECT SUBCUTANEOUSLY 40  UNITS DAILY AT 10PM (Patient taking differently: INJECT SUBCUTANEOUSLY 50  UNITS DAILY AT 10PM) 45 mL 1  . lisinopril (PRINIVIL,ZESTRIL) 5 MG tablet TAKE 1 TABLET BY MOUTH  DAILY 90 tablet 0  . ONE TOUCH ULTRA TEST test strip CHECK BLOOD SUGAR 4 TIMES  DAILY 200 each 11  . simvastatin (ZOCOR) 20 MG tablet TAKE 1 TABLET BY MOUTH  DAILY AT 6 PM. 90 tablet 0   No current facility-administered medications for this visit.     Allergies  Allergen Reactions  . Sulfa Antibiotics Hives    Family History  Problem Relation Age of Onset  . Uterine cancer Mother   . Breast cancer Mother 52  . Pancreatic  cancer Mother   . Heart disease Father   . Hypertension Father   . Anxiety disorder Father   . Diabetes Father   . Uterine cancer Maternal Aunt   . Breast cancer Maternal Aunt 60  . Heart disease Paternal Grandfather     Social History   Socioeconomic History  . Marital status: Married    Spouse name: Not on file  . Number of children: Not on file  . Years of education: Not on file  . Highest education level: Not on file  Occupational History  . Not on file  Social Needs  . Financial resource strain: Not on file  . Food insecurity:    Worry: Not on file    Inability: Not on file  . Transportation needs:    Medical: Not on file    Non-medical: Not on file  Tobacco Use  . Smoking status: Current  Every Day Smoker    Packs/day: 0.25    Years: 30.00    Pack years: 7.50    Types: Cigarettes  . Smokeless tobacco: Never Used  Substance and Sexual Activity  . Alcohol use: No    Alcohol/week: 0.0 oz  . Drug use: No  . Sexual activity: Never  Lifestyle  . Physical activity:    Days per week: Not on file    Minutes per session: Not on file  . Stress: Not on file  Relationships  . Social connections:    Talks on phone: Not on file    Gets together: Not on file    Attends religious service: Not on file    Active member of club or organization: Not on file    Attends meetings of clubs or organizations: Not on file    Relationship status: Not on file  . Intimate partner violence:    Fear of current or ex partner: Not on file    Emotionally abused: Not on file    Physically abused: Not on file    Forced sexual activity: Not on file  Other Topics Concern  . Not on file  Social History Narrative  . Not on file     Constitutional: Pt reports fatigue. Denies fever, malaise, headache or abrupt weight changes.  HEENT: Denies eye pain, eye redness, ear pain, ringing in the ears, wax buildup, runny nose, nasal congestion, bloody nose, or sore throat. Respiratory: Denies difficulty breathing, shortness of breath, cough or sputum production.   Cardiovascular: Denies chest pain, chest tightness, palpitations or swelling in the hands or feet.  Gastrointestinal: Denies abdominal pain, bloating, constipation, diarrhea or blood in the stool.  GU: Denies urgency, frequency, pain with urination, burning sensation, blood in urine, odor or discharge. Musculoskeletal: Pt reports muscle pain and joint aches. Denies decrease in range of motion, difficulty with gait, or joint swelling.  Skin: Denies redness, rashes, lesions or ulcercations.  Neurological: Pt reports paresthesia of BUE/BLE, problems with balance. Denies dizziness, difficulty with memory, difficulty with speech or problems with  coordination.  Psych: Pt reports anxiety and panic attacks. Denies depression, SI/HI.  No other specific complaints in a complete review of systems (except as listed in HPI above).  Objective:   Physical Exam  BP 140/78 (BP Location: Left Arm, Patient Position: Sitting, Cuff Size: Large)   Pulse 84   Temp 98.3 F (36.8 C) (Oral)   Resp 20   Ht '5\' 5"'$  (1.651 m)   Wt 202 lb (91.6 kg)   SpO2 98%   BMI 33.61 kg/m  Wt Readings from Last 3 Encounters:  03/16/18 202 lb (91.6 kg)  06/16/17 199 lb 8 oz (90.5 kg)  02/28/17 193 lb 12 oz (87.9 kg)    General: Appears her stated age, well developed, well nourished in NAD. Skin: Warm, dry and intact. No rashes noted. Neck:  Neck supple, trachea midline. No masses, lumps or thyromegaly present.  Cardiovascular: Normal rate and rhythm. S1,S2 noted.  No murmur, rubs or gallops noted.  Pulmonary/Chest: Normal effort and positive vesicular breath sounds. No respiratory distress. No wheezes, rales or ronchi noted.  Musculoskeletal: Strength 5/5 BUE/BLE. Multiple tender points on the upper back. No signs of joint swelling. No difficulty with gait.  Neurological: Alert and oriented. Cranial nerves II-XII grossly intact. Coordination normal.  Psychiatric: She is anxious appearing today. Behavior is normal. Judgment and thought content normal.     BMET    Component Value Date/Time   NA 141 03/16/2018 1537   NA 137 05/06/2012 0949   K 4.9 03/16/2018 1537   K 4.5 05/06/2012 0949   CL 102 03/16/2018 1537   CL 103 05/06/2012 0949   CO2 22 03/16/2018 1537   CO2 27 05/06/2012 0949   GLUCOSE 120 (H) 03/16/2018 1537   GLUCOSE 123 (H) 05/06/2012 0949   BUN 13 03/16/2018 1537   BUN 10 05/06/2012 0949   CREATININE 0.74 03/16/2018 1537   CREATININE 0.73 05/06/2012 0949   CALCIUM 10.2 03/16/2018 1537   CALCIUM 9.2 05/06/2012 0949   GFRNONAA 90 03/16/2018 1537   GFRNONAA >60 05/06/2012 0949   GFRAA 103 03/16/2018 1537   GFRAA >60 05/06/2012 0949     Lipid Panel     Component Value Date/Time   CHOL 135 02/28/2017 1604   TRIG 135 02/28/2017 1604   HDL 38 (L) 02/28/2017 1604   CHOLHDL 3.6 02/28/2017 1604   LDLCALC 70 02/28/2017 1604    CBC    Component Value Date/Time   WBC 11.8 (H) 03/16/2018 1537   WBC 11.7 (H) 05/06/2012 0949   RBC 5.38 (H) 03/16/2018 1537   RBC 5.01 05/06/2012 0949   HGB 15.6 03/16/2018 1537   HCT 47.5 (H) 03/16/2018 1537   PLT 314 03/16/2018 1537   MCV 88 03/16/2018 1537   MCV 88 05/06/2012 0949   MCH 29.0 03/16/2018 1537   MCH 28.7 05/06/2012 0949   MCHC 32.8 03/16/2018 1537   MCHC 32.7 05/06/2012 0949   RDW 14.1 03/16/2018 1537   RDW 13.7 05/06/2012 0949   LYMPHSABS 5.1 (H) 02/28/2017 1604   EOSABS 0.1 02/28/2017 1604   BASOSABS 0.1 02/28/2017 1604    Hgb A1C Lab Results  Component Value Date   HGBA1C 9.4 (H) 03/16/2018            Assessment & Plan:   Fatigue, Generalized Body Aches, Joint Pains, Generalized Weakness, Paresthesia of BUE/BLE:  Exam benign Will check CBC, CMET, TSH, ANA, RF, ESR, CRP  DM 2:  Will check A1C today Continue current medication therapy, will adjust if needed based on labs  Anxiety, Depression, Panic Attacks:  Will check TSH today Consider Effexor if labs are normal  Dizziness:  Orthostatics negative Encouraged her to push fluids Will check CBC, CMET and TSH today  Will follow up after labs, return precautions discussed Webb Silversmith, NP

## 2018-03-17 LAB — ANA

## 2018-03-18 LAB — RHEUMATOID FACTOR: Rhuematoid fact SerPl-aCnc: <10 IU/mL (ref 0.0–13.9)

## 2018-03-19 ENCOUNTER — Encounter: Payer: Self-pay | Admitting: Internal Medicine

## 2018-03-19 NOTE — Patient Instructions (Signed)

## 2018-03-21 LAB — COMPREHENSIVE METABOLIC PANEL
ALBUMIN: 4.6 g/dL (ref 3.5–5.5)
ALK PHOS: 59 IU/L (ref 39–117)
ALT: 12 IU/L (ref 0–32)
AST: 12 IU/L (ref 0–40)
Albumin/Globulin Ratio: 1.8 (ref 1.2–2.2)
BUN/Creatinine Ratio: 18 (ref 9–23)
BUN: 13 mg/dL (ref 6–24)
Bilirubin Total: 0.3 mg/dL (ref 0.0–1.2)
CO2: 22 mmol/L (ref 20–29)
CREATININE: 0.74 mg/dL (ref 0.57–1.00)
Calcium: 10.2 mg/dL (ref 8.7–10.2)
Chloride: 102 mmol/L (ref 96–106)
GFR, EST AFRICAN AMERICAN: 103 mL/min/{1.73_m2} (ref >59)
GFR, EST NON AFRICAN AMERICAN: 90 mL/min/{1.73_m2} (ref >59)
GLUCOSE: 120 mg/dL — AB (ref 65–99)
Globulin, Total: 2.5 g/dL (ref 1.5–4.5)
POTASSIUM: 4.9 mmol/L (ref 3.5–5.2)
Sodium: 141 mmol/L (ref 134–144)
Total Protein: 7.1 g/dL (ref 6.0–8.5)

## 2018-03-21 LAB — CBC
Hematocrit: 47.5 % — ABNORMAL HIGH (ref 34.0–46.6)
Hemoglobin: 15.6 g/dL (ref 11.1–15.9)
MCH: 29 pg (ref 26.6–33.0)
MCHC: 32.8 g/dL (ref 31.5–35.7)
MCV: 88 fL (ref 79–97)
Platelets: 314 10*3/uL (ref 150–379)
RBC: 5.38 x10E6/uL — AB (ref 3.77–5.28)
RDW: 14.1 % (ref 12.3–15.4)
WBC: 11.8 10*3/uL — ABNORMAL HIGH (ref 3.4–10.8)

## 2018-03-21 LAB — VITAMIN B12: Vitamin B-12: 301 pg/mL (ref 232–1245)

## 2018-03-21 LAB — ANA: ANA: NEGATIVE

## 2018-03-21 LAB — VITAMIN D 25 HYDROXY (VIT D DEFICIENCY, FRACTURES): VIT D 25 HYDROXY: 30.2 ng/mL (ref 30.0–100.0)

## 2018-03-21 LAB — HEMOGLOBIN A1C
Est. average glucose Bld gHb Est-mCnc: 223 mg/dL
Hgb A1c MFr Bld: 9.4 % — ABNORMAL HIGH (ref 4.8–5.6)

## 2018-03-21 LAB — TSH: TSH: 1.43 u[IU]/mL (ref 0.450–4.500)

## 2018-03-21 LAB — HIGH SENSITIVITY CRP: CRP, High Sensitivity: 5.47 mg/L — ABNORMAL HIGH (ref 0.00–3.00)

## 2018-03-21 LAB — SEDIMENTATION RATE

## 2018-03-22 ENCOUNTER — Encounter: Payer: Self-pay | Admitting: Internal Medicine

## 2018-03-25 ENCOUNTER — Encounter: Payer: Self-pay | Admitting: Internal Medicine

## 2018-03-25 ENCOUNTER — Other Ambulatory Visit: Payer: Self-pay | Admitting: Internal Medicine

## 2018-03-25 DIAGNOSIS — Z794 Long term (current) use of insulin: Secondary | ICD-10-CM

## 2018-03-25 DIAGNOSIS — R202 Paresthesia of skin: Secondary | ICD-10-CM

## 2018-03-25 DIAGNOSIS — E1142 Type 2 diabetes mellitus with diabetic polyneuropathy: Secondary | ICD-10-CM

## 2018-03-25 MED ORDER — GABAPENTIN 300 MG PO CAPS: 300.0000 mg | ORAL_CAPSULE | Freq: Three times a day (TID) | ORAL | 1 refills | Status: DC

## 2018-03-25 MED ORDER — CITALOPRAM HYDROBROMIDE 10 MG PO TABS: 10.0000 mg | ORAL_TABLET | Freq: Every day | ORAL | 1 refills | Status: DC

## 2018-04-22 DIAGNOSIS — R200 Anesthesia of skin: Secondary | ICD-10-CM | POA: Insufficient documentation

## 2018-04-22 DIAGNOSIS — R202 Paresthesia of skin: Secondary | ICD-10-CM | POA: Insufficient documentation

## 2018-04-28 DIAGNOSIS — R200 Anesthesia of skin: Secondary | ICD-10-CM | POA: Insufficient documentation

## 2018-04-28 DIAGNOSIS — R29898 Other symptoms and signs involving the musculoskeletal system: Secondary | ICD-10-CM | POA: Insufficient documentation

## 2018-04-28 DIAGNOSIS — G5603 Carpal tunnel syndrome, bilateral upper limbs: Secondary | ICD-10-CM | POA: Insufficient documentation

## 2018-04-30 ENCOUNTER — Other Ambulatory Visit: Payer: Self-pay | Admitting: Internal Medicine

## 2018-05-04 ENCOUNTER — Other Ambulatory Visit: Payer: Self-pay | Admitting: Internal Medicine

## 2018-06-06 ENCOUNTER — Encounter: Payer: Self-pay | Admitting: Internal Medicine

## 2018-06-09 ENCOUNTER — Ambulatory Visit: Payer: Managed Care, Other (non HMO) | Admitting: Internal Medicine

## 2018-06-09 ENCOUNTER — Encounter: Payer: Self-pay | Admitting: Internal Medicine

## 2018-06-09 VITALS — BP 116/70 | HR 76 | Temp 98.2°F | Wt 196.0 lb

## 2018-06-09 DIAGNOSIS — M7918 Myalgia, other site: Secondary | ICD-10-CM

## 2018-06-09 DIAGNOSIS — F339 Major depressive disorder, recurrent, unspecified: Secondary | ICD-10-CM | POA: Diagnosis not present

## 2018-06-09 DIAGNOSIS — R202 Paresthesia of skin: Secondary | ICD-10-CM

## 2018-06-09 DIAGNOSIS — R5383 Other fatigue: Secondary | ICD-10-CM

## 2018-06-09 DIAGNOSIS — G8929 Other chronic pain: Secondary | ICD-10-CM

## 2018-06-09 DIAGNOSIS — Z0289 Encounter for other administrative examinations: Secondary | ICD-10-CM

## 2018-06-09 DIAGNOSIS — J383 Other diseases of vocal cords: Secondary | ICD-10-CM | POA: Diagnosis not present

## 2018-06-09 MED ORDER — CLONAZEPAM 0.5 MG PO TABS: ORAL_TABLET | ORAL | 0 refills | Status: DC

## 2018-06-09 MED ORDER — INSULIN PEN NEEDLE 29G X 12.7MM MISC: 1.0000 | Freq: Four times a day (QID) | 1 refills | Status: DC

## 2018-06-09 MED ORDER — GABAPENTIN 300 MG PO CAPS: 300.0000 mg | ORAL_CAPSULE | Freq: Four times a day (QID) | ORAL | 1 refills | Status: DC

## 2018-06-09 MED ORDER — NORTRIPTYLINE HCL 10 MG PO CAPS: 20.0000 mg | ORAL_CAPSULE | Freq: Every day | ORAL | 1 refills | Status: DC

## 2018-06-10 ENCOUNTER — Encounter: Payer: Self-pay | Admitting: Internal Medicine

## 2018-06-10 NOTE — Patient Instructions (Signed)

## 2018-06-10 NOTE — Progress Notes (Signed)
Subjective:    Patient ID: Brittany Pruitt, female    DOB: 1961/01/26, 57 y.o.   MRN: [id number]  HPI  Pt presents to the clinic today to for form completion. She reports she is trying to file a social security disability claim. She has a history of depression which causes her to lack motivation, labile mood, fatigue and sleeping issues. She has trouble focusing and concentrating on things at work. She has times where she feels paniky and anxious. 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 She has bilateral carpal tunnel symdrom which causes pain and weakness with hand manipulation. She has bilateral low back pain, paresthesias and weakness in lower extremities.   Review of Systems      Past Medical History:  Diagnosis Date  . Chicken pox   . Depression   . Diabetes mellitus without complication ([place])   . Frequent headaches     Current Outpatient Medications  Medication Sig Dispense Refill  . B-D ULTRAFINE III SHORT PEN 31G X 8 MM MISC USE 1 PEN NEEDLE 4 TIMES  DAILY 80 each 2  . citalopram (CELEXA) 10 MG tablet Take 1 tablet (10 mg total) by mouth daily. 90 tablet 1  . clonazePAM (KLONOPIN) 0.5 MG tablet TAKE 1/2 TO 1 TABLET BY  MOUTH TWO TIMES DAILY AS  NEEDED 45 tablet 0  . gabapentin (NEURONTIN) 300 MG capsule Take 1 capsule (300 mg total) by mouth 4 (four) times daily. 360 capsule 1  . glipiZIDE (GLUCOTROL) 10 MG tablet TAKE 1 TABLET BY MOUTH 2  TIMES DAILY BEFORE A MEAL. 180 tablet 0  . HUMALOG KWIKPEN 100 UNIT/ML KiwkPen INJECT SUBCUTANEOUSLY 8  UNITS WITH BREAKFAST, 10  UNITS WITH LUNCH, 12 UNITS  WITH DINNER 30 mL 0  . Insulin Pen Needle 29G X 12.7MM MISC 1 each by Does not apply route 4 (four) times daily. 400 each 1  . LANTUS SOLOSTAR 100 UNIT/ML Solostar Pen INJECT SUBCUTANEOUSLY 40  UNITS DAILY AT 10PM (Patient taking differently: INJECT SUBCUTANEOUSLY 50  UNITS DAILY AT 10PM) 45 mL 1  . lisinopril (PRINIVIL,ZESTRIL) 5 MG tablet TAKE 1 TABLET BY  MOUTH  DAILY 90 tablet 0  . ONE TOUCH ULTRA TEST test strip CHECK BLOOD SUGAR 4 TIMES  DAILY 200 each 11  . simvastatin (ZOCOR) 20 MG tablet TAKE 1 TABLET BY MOUTH  DAILY AT 6 PM. 90 tablet 0  . nortriptyline (PAMELOR) 10 MG capsule Take 2 capsules (20 mg total) by mouth at bedtime. 90 capsule 1   No current facility-administered medications for this visit.     Allergies  Allergen Reactions  . Sulfa Antibiotics Hives    Family History  Problem Relation Age of Onset  . Uterine cancer Mother   . Breast cancer Mother 45  . Pancreatic cancer Mother   . Heart disease Father   . Hypertension Father   . Anxiety disorder Father   . Diabetes Father   . Uterine cancer Maternal Aunt   . Breast cancer Maternal Aunt 60  . Heart disease Paternal Grandfather     Social History   Socioeconomic History  . Marital status: Married    Spouse name: Not on file  . Number of children: Not on file  . Years of education: Not on file  . Highest education level: Not on file  Occupational History  . Not on file  Social Needs  . Financial resource strain: Not on file  . Food insecurity:    Worry:  Not on file    Inability: Not on file  . Transportation needs:    Medical: Not on file    Non-medical: Not on file  Tobacco Use  . Smoking status: Current Every Day Smoker    Packs/day: 0.25    Years: 30.00    Pack years: 7.50    Types: Cigarettes  . Smokeless tobacco: Never Used  Substance and Sexual Activity  . Alcohol use: No    Alcohol/week: 0.0 oz  . Drug use: No  . Sexual activity: Never  Lifestyle  . Physical activity:    Days per week: Not on file    Minutes per session: Not on file  . Stress: Not on file  Relationships  . Social connections:    Talks on phone: Not on file    Gets together: Not on file    Attends religious service: Not on file    Active member of club or organization: Not on file    Attends meetings of clubs or organizations: Not on file    Relationship  status: Not on file  . Intimate partner violence:    Fear of current or ex partner: Not on file    Emotionally abused: Not on file    Physically abused: Not on file    Forced sexual activity: Not on file  Other Topics Concern  . Not on file  Social History Narrative  . Not on file     Constitutional: Pt reports fatigue. Denies fever, malaise, headache or abrupt weight changes.  HEENT: Denies eye pain, eye redness, ear pain, ringing in the ears, wax buildup, runny nose, nasal congestion, bloody nose, or sore throat. Respiratory: Denies difficulty breathing, shortness of breath, cough or sputum production.   Cardiovascular: Denies chest pain, chest tightness, palpitations or swelling in the hands or feet.  Gastrointestinal: Denies abdominal pain, bloating, constipation, diarrhea or blood in the stool.  GU: Denies urgency, frequency, pain with urination, burning sensation, blood in urine, odor or discharge. Musculoskeletal: Pt reports muscle pain, low back pain. Denies decrease in range of motion, or joint swelling.  Skin: Denies redness, rashes, lesions or ulcercations.  Neurological: Pt reports paresthesias of bilateral upper and lower extremities, difficulty with speech, problems focusing. Denies dizziness, difficulty with memory,.  Psych: Pt reports anxiety and depression. Denies SI/HI.  No other specific complaints in a complete review of systems (except as listed in HPI above).  Objective:   Physical Exam  BP 116/70   Pulse 76   Temp 98.2 F (36.8 C) (Oral)   Wt 196 lb (88.9 kg)   SpO2 98%   BMI 32.62 kg/m  Wt Readings from Last 3 Encounters:  06/09/18 196 lb (88.9 kg)  03/16/18 202 lb (91.6 kg)  06/16/17 199 lb 8 oz (90.5 kg)    General: Appears her stated age, obese in NAD. Skin: Warm, dry and intact. No rashes, lesions or ulcerations noted. Musculoskeletal: Strength 4/5 BUE/BLE. No signs of joint swelling. Gait slow and steady without device but she does have some  difficulty changing positions.  Neurological: Alert and oriented. Positive Phalen's. Positive Tinel's. Sensation decreased to BUE/BLE. Psychiatric: Mood and affect mildly flat. Behavior is normal. Judgment and thought content normal.     BMET    Component Value Date/Time   NA 141 03/16/2018 1537   NA 137 05/06/2012 0949   K 4.9 03/16/2018 1537   K 4.5 05/06/2012 0949   CL 102 03/16/2018 1537   CL 103 05/06/2012 0949  CO2 22 03/16/2018 1537   CO2 27 05/06/2012 0949   GLUCOSE 120 (H) 03/16/2018 1537   GLUCOSE 123 (H) 05/06/2012 0949   BUN 13 03/16/2018 1537   BUN 10 05/06/2012 0949   CREATININE 0.74 03/16/2018 1537   CREATININE 0.73 05/06/2012 0949   CALCIUM 10.2 03/16/2018 1537   CALCIUM 9.2 05/06/2012 0949   GFRNONAA 90 03/16/2018 1537   GFRNONAA >60 05/06/2012 0949   GFRAA 103 03/16/2018 1537   GFRAA >60 05/06/2012 0949    Lipid Panel     Component Value Date/Time   CHOL 135 02/28/2017 1604   TRIG 135 02/28/2017 1604   HDL 38 (L) 02/28/2017 1604   CHOLHDL 3.6 02/28/2017 1604   LDLCALC 70 02/28/2017 1604    CBC    Component Value Date/Time   WBC 11.8 (H) 03/16/2018 1537   WBC 11.7 (H) 05/06/2012 0949   RBC 5.38 (H) 03/16/2018 1537   RBC 5.01 05/06/2012 0949   HGB 15.6 03/16/2018 1537   HCT 47.5 (H) 03/16/2018 1537   PLT 314 03/16/2018 1537   MCV 88 03/16/2018 1537   MCV 88 05/06/2012 0949   MCH 29.0 03/16/2018 1537   MCH 28.7 05/06/2012 0949   MCHC 32.8 03/16/2018 1537   MCHC 32.7 05/06/2012 0949   RDW 14.1 03/16/2018 1537   RDW 13.7 05/06/2012 0949   LYMPHSABS 5.1 (H) 02/28/2017 1604   EOSABS 0.1 02/28/2017 1604   BASOSABS 0.1 02/28/2017 1604    Hgb A1C Lab Results  Component Value Date   HGBA1C 9.4 (H) 03/16/2018            Assessment & Plan:   Encounter for Form Completion, Depression, Paresthesias of BUE/BLE, Spasmodic Dysphonia, Chronic Muscle Pain, Fatigue:  [employer] form completed, will scan and return to patient Encouraged regular  physical activity and rest Continue current medications at this time  Return precautions discussed Webb Silversmith, NP

## 2018-06-17 ENCOUNTER — Ambulatory Visit: Payer: Managed Care, Other (non HMO) | Admitting: Internal Medicine

## 2018-06-17 ENCOUNTER — Ambulatory Visit (INDEPENDENT_AMBULATORY_CARE_PROVIDER_SITE_OTHER)
Admission: RE | Admit: 2018-06-17 | Discharge: 2018-06-17 | Disposition: A | Discharge status: Home / Self Care | Payer: Managed Care, Other (non HMO) | Source: Ambulatory Visit | Attending: Internal Medicine | Admitting: Internal Medicine

## 2018-06-17 ENCOUNTER — Encounter: Payer: Self-pay | Admitting: Internal Medicine

## 2018-06-17 VITALS — BP 124/82 | HR 68 | Temp 98.4°F | Wt 194.0 lb

## 2018-06-17 DIAGNOSIS — M545 Low back pain: Secondary | ICD-10-CM | POA: Diagnosis not present

## 2018-06-17 DIAGNOSIS — R202 Paresthesia of skin: Secondary | ICD-10-CM | POA: Diagnosis not present

## 2018-06-17 DIAGNOSIS — M25511 Pain in right shoulder: Secondary | ICD-10-CM

## 2018-06-17 DIAGNOSIS — M25512 Pain in left shoulder: Secondary | ICD-10-CM

## 2018-06-17 DIAGNOSIS — G8929 Other chronic pain: Secondary | ICD-10-CM

## 2018-06-17 DIAGNOSIS — E118 Type 2 diabetes mellitus with unspecified complications: Secondary | ICD-10-CM | POA: Diagnosis not present

## 2018-06-17 DIAGNOSIS — M542 Cervicalgia: Secondary | ICD-10-CM | POA: Diagnosis not present

## 2018-06-17 LAB — POCT GLYCOSYLATED HEMOGLOBIN (HGB A1C): Hemoglobin A1C: 7.2 % — AB (ref 4.0–5.6)

## 2018-06-17 MED ORDER — CITALOPRAM HYDROBROMIDE 20 MG PO TABS: 20.0000 mg | ORAL_TABLET | Freq: Every day | ORAL | 3 refills | Status: DC

## 2018-06-17 NOTE — Patient Instructions (Signed)

## 2018-06-17 NOTE — Progress Notes (Signed)
Subjective:    Patient ID: Brittany Pruitt, female    DOB: Mar 11, 1961, 57 y.o.   MRN: [id number]  HPI  Patient presents to the clinic today with multiple complaints.  1- She reports worsening anxiety and stress.  With all of her health issues, she is concerned about that she is currently used to.  She denied for disability.  She is still waiting on the form for me to complete.  2-She has persistent numbness in her upper and lower extremities.  She has been seeing neurology for the same.  She reports she has been diagnosed with carpal tunnel bilateral upper extremities.  She is not able to see a hand surgeon at this time because she is going to no longer have insurance at the end of the month.  She is taking Gabapentin for paresthesias in her lower extremities.  She reports the neurologist advised her to increase this but she is not willing to do this due to it already causing sedation.  3-She reports persistent cervical and lumbar pain as well as bilateral shoulder pain.  She describes the pain is sore and achy.  Pain does not radiate.  She denies weakness of the upper or lower extremities.  She thinks she may have arthritis but is not positive.  She has tried ibuprofen over-the-counter as needed with some relief.  4- She would like her A1C repeated today. Her last A1C was 9.2%. She is taking Lantus and Glipizide as prescribed.   Review of Systems      Past Medical History:  Diagnosis Date  . Chicken pox   . Depression   . Diabetes mellitus without complication ([place])   . Frequent headaches     Current Outpatient Medications  Medication Sig Dispense Refill  . B-D ULTRAFINE III SHORT PEN 31G X 8 MM MISC USE 1 PEN NEEDLE 4 TIMES  DAILY 80 each 2  . clonazePAM (KLONOPIN) 0.5 MG tablet TAKE 1/2 TO 1 TABLET BY  MOUTH TWO TIMES DAILY AS  NEEDED 45 tablet 0  . gabapentin (NEURONTIN) 300 MG capsule Take 1 capsule (300 mg total) by mouth 4 (four) times daily. 360 capsule 1  . glipiZIDE  (GLUCOTROL) 10 MG tablet TAKE 1 TABLET BY MOUTH 2  TIMES DAILY BEFORE A MEAL. 180 tablet 0  . HUMALOG KWIKPEN 100 UNIT/ML KiwkPen INJECT SUBCUTANEOUSLY 8  UNITS WITH BREAKFAST, 10  UNITS WITH LUNCH, 12 UNITS  WITH DINNER 30 mL 0  . Insulin Pen Needle 29G X 12.7MM MISC 1 each by Does not apply route 4 (four) times daily. 400 each 1  . LANTUS SOLOSTAR 100 UNIT/ML Solostar Pen INJECT SUBCUTANEOUSLY 40  UNITS DAILY AT 10PM (Patient taking differently: INJECT SUBCUTANEOUSLY 50  UNITS DAILY AT 10PM) 45 mL 1  . lisinopril (PRINIVIL,ZESTRIL) 5 MG tablet TAKE 1 TABLET BY MOUTH  DAILY 90 tablet 0  . nortriptyline (PAMELOR) 10 MG capsule Take 2 capsules (20 mg total) by mouth at bedtime. 90 capsule 1  . ONE TOUCH ULTRA TEST test strip CHECK BLOOD SUGAR 4 TIMES  DAILY 200 each 11  . simvastatin (ZOCOR) 20 MG tablet TAKE 1 TABLET BY MOUTH  DAILY AT 6 PM. 90 tablet 0  . citalopram (CELEXA) 20 MG tablet Take 1 tablet (20 mg total) by mouth daily. 90 tablet 3   No current facility-administered medications for this visit.     Allergies  Allergen Reactions  . Sulfa Antibiotics Hives    Family History  Problem Relation Age of  Onset  . Uterine cancer Mother   . Breast cancer Mother 32  . Pancreatic cancer Mother   . Heart disease Father   . Hypertension Father   . Anxiety disorder Father   . Diabetes Father   . Uterine cancer Maternal Aunt   . Breast cancer Maternal Aunt 60  . Heart disease Paternal Grandfather     Social History   Socioeconomic History  . Marital status: Married    Spouse name: Not on file  . Number of children: Not on file  . Years of education: Not on file  . Highest education level: Not on file  Occupational History  . Not on file  Social Needs  . Financial resource strain: Not on file  . Food insecurity:    Worry: Not on file    Inability: Not on file  . Transportation needs:    Medical: Not on file    Non-medical: Not on file  Tobacco Use  . Smoking status:  Current Every Day Smoker    Packs/day: 0.25    Years: 30.00    Pack years: 7.50    Types: Cigarettes  . Smokeless tobacco: Never Used  Substance and Sexual Activity  . Alcohol use: No    Alcohol/week: 0.0 standard drinks  . Drug use: No  . Sexual activity: Never  Lifestyle  . Physical activity:    Days per week: Not on file    Minutes per session: Not on file  . Stress: Not on file  Relationships  . Social connections:    Talks on phone: Not on file    Gets together: Not on file    Attends religious service: Not on file    Active member of club or organization: Not on file    Attends meetings of clubs or organizations: Not on file    Relationship status: Not on file  . Intimate partner violence:    Fear of current or ex partner: Not on file    Emotionally abused: Not on file    Physically abused: Not on file    Forced sexual activity: Not on file  Other Topics Concern  . Not on file  Social History Narrative  . Not on file     Constitutional: Denies fever, malaise, fatigue, headache or abrupt weight changes.  Musculoskeletal: Pt reports cervical and low back pain. Denies decrease in range of motion, difficulty with gait, muscle pain or swelling.  Skin: Denies redness, rashes, lesions or ulcercations.  Neurological: Pt reports paresthesias of upper and lower extremities bilaterally.  Denies dizziness, difficulty with memory, difficulty with speech or problems with balance and coordination.  Psych: Patient reports anxiety and stress.  Denies depression, SI/HI.  No other specific complaints in a complete review of systems (except as listed in HPI above).   Objective:   Physical Exam  BP 124/82   Pulse 68   Temp 98.4 F (36.9 C) (Oral)   Wt 194 lb (88 kg)   SpO2 98%   BMI 32.28 kg/m  Wt Readings from Last 3 Encounters:  06/17/18 194 lb (88 kg)  06/09/18 196 lb (88.9 kg)  03/16/18 202 lb (91.6 kg)    General: Appears her stated age, chronically ill appearing,  in NAD. Cardiovascular: Normal rate and rhythm. S1,S2 noted.  No murmur, rubs or gallops noted. No JVD or BLE edema.  Pulmonary/Chest: Normal effort and positive vesicular breath sounds. No respiratory distress. No wheezes, rales or ronchi noted.  Abdomen: Soft and nontender.  Normal bowel sounds. No distention or masses noted. Liver, spleen and kidneys non palpable. Musculoskeletal: Normal flexion, extension and rotation of the spine. No bony tenderness noted over the spine. Strength 4/5 BUE/BLE Neurological: Alert and oriented. Sensation intact to BUE/BLE. Psychiatric: She is mildly anxious appearing.  BMET    Component Value Date/Time   NA 141 03/16/2018 1537   NA 137 05/06/2012 0949   K 4.9 03/16/2018 1537   K 4.5 05/06/2012 0949   CL 102 03/16/2018 1537   CL 103 05/06/2012 0949   CO2 22 03/16/2018 1537   CO2 27 05/06/2012 0949   GLUCOSE 120 (H) 03/16/2018 1537   GLUCOSE 123 (H) 05/06/2012 0949   BUN 13 03/16/2018 1537   BUN 10 05/06/2012 0949   CREATININE 0.74 03/16/2018 1537   CREATININE 0.73 05/06/2012 0949   CALCIUM 10.2 03/16/2018 1537   CALCIUM 9.2 05/06/2012 0949   GFRNONAA 90 03/16/2018 1537   GFRNONAA >60 05/06/2012 0949   GFRAA 103 03/16/2018 1537   GFRAA >60 05/06/2012 0949    Lipid Panel     Component Value Date/Time   CHOL 135 02/28/2017 1604   TRIG 135 02/28/2017 1604   HDL 38 (L) 02/28/2017 1604   CHOLHDL 3.6 02/28/2017 1604   LDLCALC 70 02/28/2017 1604    CBC    Component Value Date/Time   WBC 11.8 (H) 03/16/2018 1537   WBC 11.7 (H) 05/06/2012 0949   RBC 5.38 (H) 03/16/2018 1537   RBC 5.01 05/06/2012 0949   HGB 15.6 03/16/2018 1537   HCT 47.5 (H) 03/16/2018 1537   PLT 314 03/16/2018 1537   MCV 88 03/16/2018 1537   MCV 88 05/06/2012 0949   MCH 29.0 03/16/2018 1537   MCH 28.7 05/06/2012 0949   MCHC 32.8 03/16/2018 1537   MCHC 32.7 05/06/2012 0949   RDW 14.1 03/16/2018 1537   RDW 13.7 05/06/2012 0949   LYMPHSABS 5.1 (H) 02/28/2017 1604    EOSABS 0.1 02/28/2017 1604   BASOSABS 0.1 02/28/2017 1604    Hgb A1C Lab Results  Component Value Date   HGBA1C 7.2 (A) 06/17/2018            Assessment & Plan:   Anxiety:  Support offered today Increase Celexa to 20 mg daily  Acute Neck Pain, Acute Bilateral Shoulder Pain, Acute Low Back Pain:  Xray bilateral shoulders Xray lumbar spine  Xray cervical spine Encouraged heat and regular stretching   DM 2:  A1C today No microalbumin secondary to ACEI therapy Continue Lantus and Glipizide for now- will adjust if needed No need to continue to see endocrinology at this time  Will follow up after labs and xrays. Return precautions discussed Webb Silversmith, NP

## 2018-06-26 ENCOUNTER — Ambulatory Visit (INDEPENDENT_AMBULATORY_CARE_PROVIDER_SITE_OTHER): Payer: Managed Care, Other (non HMO) | Admitting: Internal Medicine

## 2018-06-26 ENCOUNTER — Encounter: Payer: Self-pay | Admitting: Internal Medicine

## 2018-06-26 VITALS — BP 118/76 | HR 75 | Temp 98.1°F | Ht 65.0 in | Wt 197.0 lb

## 2018-06-26 DIAGNOSIS — Z1231 Encounter for screening mammogram for malignant neoplasm of breast: Secondary | ICD-10-CM | POA: Diagnosis not present

## 2018-06-26 DIAGNOSIS — F419 Anxiety disorder, unspecified: Secondary | ICD-10-CM

## 2018-06-26 DIAGNOSIS — E1165 Type 2 diabetes mellitus with hyperglycemia: Secondary | ICD-10-CM

## 2018-06-26 DIAGNOSIS — J383 Other diseases of vocal cords: Secondary | ICD-10-CM | POA: Diagnosis not present

## 2018-06-26 DIAGNOSIS — Z0001 Encounter for general adult medical examination with abnormal findings: Secondary | ICD-10-CM

## 2018-06-26 DIAGNOSIS — Z1239 Encounter for other screening for malignant neoplasm of breast: Secondary | ICD-10-CM

## 2018-06-26 DIAGNOSIS — R51 Headache: Secondary | ICD-10-CM

## 2018-06-26 DIAGNOSIS — E7800 Pure hypercholesterolemia, unspecified: Secondary | ICD-10-CM

## 2018-06-26 DIAGNOSIS — F329 Major depressive disorder, single episode, unspecified: Secondary | ICD-10-CM

## 2018-06-26 DIAGNOSIS — Z794 Long term (current) use of insulin: Secondary | ICD-10-CM

## 2018-06-26 DIAGNOSIS — Z780 Asymptomatic menopausal state: Secondary | ICD-10-CM | POA: Diagnosis not present

## 2018-06-26 DIAGNOSIS — R519 Headache, unspecified: Secondary | ICD-10-CM

## 2018-06-26 DIAGNOSIS — I10 Essential (primary) hypertension: Secondary | ICD-10-CM | POA: Diagnosis not present

## 2018-06-26 NOTE — Progress Notes (Signed)
Subjective:    Patient ID: Brittany Pruitt, female    DOB: May 13, 1961, 57 y.o.   MRN: [id number]  HPI  Pt presents to the clinic today for her annual exam. She is also due to follow up chronic conditions.  Anxiety and Depression: Chronic, worse with recent resignation from job, worsening health concerns. She is taking Celexa and Clonazepam as prescribed.  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 She is taking as Humalog, Lantus, Glipizide and Neurontin as prescribed. She has not been checking her sugars as she should. She checks her feet routinely.   HTN: Her BP today is 118/76. She is taking Lisinopril mainly for kidney protection. There is no ECG on file.  HLD: Her last LDL was 70, 02/2017. She denies myalgias on Simvastatin. She tries to consume a low fat diet.   Frequent Headaches: Worsening due to increased stress. She is still taking Nortriptyline as prescribed.   Spasmodic Dysphonia: Persistent. She takes Clonazepam as needed for this.  Flu: 08/2015 Tetanus: 06/2017 Prevnar: 05/2015 Pneumovax: never Pap Smear: 2013, total hysterectomy Mammogram: 11/2015, [place] [facility] Colon Screening: 02/2016 Vision Screening: 06/2018, [place] Dentist: as needed  Diet: She does eat a little lean meat. She consumes fruits and veggies daily. She tries to avoid fried foods. She drinks mostly water, coffee. Exercise: None  Review of Systems      Past Medical History:  Diagnosis Date  . Chicken pox   . Depression   . Diabetes mellitus without complication ([place])   . Frequent headaches     Current Outpatient Medications  Medication Sig Dispense Refill  . B-D ULTRAFINE III SHORT PEN 31G X 8 MM MISC USE 1 PEN NEEDLE 4 TIMES  DAILY 80 each 2  . citalopram (CELEXA) 20 MG tablet Take 1 tablet (20 mg total) by mouth daily. 90 tablet 3  . clonazePAM (KLONOPIN) 0.5 MG tablet TAKE 1/2 TO 1 TABLET BY  MOUTH TWO TIMES DAILY AS  NEEDED 45 tablet 0  . gabapentin (NEURONTIN) 300 MG  capsule Take 1 capsule (300 mg total) by mouth 4 (four) times daily. 360 capsule 1  . glipiZIDE (GLUCOTROL) 10 MG tablet TAKE 1 TABLET BY MOUTH 2  TIMES DAILY BEFORE A MEAL. 180 tablet 0  . HUMALOG KWIKPEN 100 UNIT/ML KiwkPen INJECT SUBCUTANEOUSLY 8  UNITS WITH BREAKFAST, 10  UNITS WITH LUNCH, 12 UNITS  WITH DINNER 30 mL 0  . Insulin Pen Needle 29G X 12.7MM MISC 1 each by Does not apply route 4 (four) times daily. 400 each 1  . LANTUS SOLOSTAR 100 UNIT/ML Solostar Pen INJECT SUBCUTANEOUSLY 40  UNITS DAILY AT 10PM (Patient taking differently: INJECT SUBCUTANEOUSLY 50  UNITS DAILY AT 10PM) 45 mL 1  . lisinopril (PRINIVIL,ZESTRIL) 5 MG tablet TAKE 1 TABLET BY MOUTH  DAILY 90 tablet 0  . nortriptyline (PAMELOR) 10 MG capsule Take 2 capsules (20 mg total) by mouth at bedtime. 90 capsule 1  . ONE TOUCH ULTRA TEST test strip CHECK BLOOD SUGAR 4 TIMES  DAILY 200 each 11  . simvastatin (ZOCOR) 20 MG tablet TAKE 1 TABLET BY MOUTH  DAILY AT 6 PM. 90 tablet 0   No current facility-administered medications for this visit.     Allergies  Allergen Reactions  . Sulfa Antibiotics Hives    Family History  Problem Relation Age of Onset  . Uterine cancer Mother   . Breast cancer Mother 36  . Pancreatic cancer Mother   . Heart disease Father   .  Hypertension Father   . Anxiety disorder Father   . Diabetes Father   . Uterine cancer Maternal Aunt   . Breast cancer Maternal Aunt 60  . Heart disease Paternal Grandfather     Social History   Socioeconomic History  . Marital status: Married    Spouse name: Not on file  . Number of children: Not on file  . Years of education: Not on file  . Highest education level: Not on file  Occupational History  . Not on file  Social Needs  . Financial resource strain: Not on file  . Food insecurity:    Worry: Not on file    Inability: Not on file  . Transportation needs:    Medical: Not on file    Non-medical: Not on file  Tobacco Use  . Smoking status:  Current Every Day Smoker    Packs/day: 0.25    Years: 30.00    Pack years: 7.50    Types: Cigarettes  . Smokeless tobacco: Never Used  Substance and Sexual Activity  . Alcohol use: No    Alcohol/week: 0.0 standard drinks  . Drug use: No  . Sexual activity: Never  Lifestyle  . Physical activity:    Days per week: Not on file    Minutes per session: Not on file  . Stress: Not on file  Relationships  . Social connections:    Talks on phone: Not on file    Gets together: Not on file    Attends religious service: Not on file    Active member of club or organization: Not on file    Attends meetings of clubs or organizations: Not on file    Relationship status: Not on file  . Intimate partner violence:    Fear of current or ex partner: Not on file    Emotionally abused: Not on file    Physically abused: Not on file    Forced sexual activity: Not on file  Other Topics Concern  . Not on file  Social History Narrative  . Not on file     Constitutional: Pt reports fatigue, headaches. Denies fever, malaise,or abrupt weight changes.  HEENT: Denies eye pain, eye redness, ear pain, ringing in the ears, wax buildup, runny nose, nasal congestion, bloody nose, or sore throat. Respiratory: Denies difficulty breathing, shortness of breath, cough or sputum production.   Cardiovascular: Denies chest pain, chest tightness, palpitations or swelling in the hands or feet.  Gastrointestinal: Pt reports some loose stools. Denies abdominal pain, bloating, constipation, or blood in the stool.  GU: Denies urgency, frequency, pain with urination, burning sensation, blood in urine, odor or discharge. Musculoskeletal: Pt reports joint pains, weakness. Denies decrease in range of motion, difficulty with gait, muscle pain or joint swelling.  Skin: Denies redness, rashes, lesions or ulcercations.  Neurological: Pt reports difficulty with speech, neuropathic pain. Denies dizziness, difficulty with memory, or  problems with balance and coordination.  Psych: Pt reports anxiety and depression. Denies SI/HI.  No other specific complaints in a complete review of systems (except as listed in HPI above).  Objective:   Physical Exam  BP 118/76   Pulse 75   Temp 98.1 F (36.7 C) (Oral)   Ht 5\' 5"  (1.651 m)   Wt 197 lb (89.4 kg)   SpO2 97%   BMI 32.78 kg/m   Wt Readings from Last 3 Encounters:  06/17/18 194 lb (88 kg)  06/09/18 196 lb (88.9 kg)  03/16/18 202 lb (91.6 kg)  General: Appears her stated age, in NAD. Skin: Warm, dry and intact.  HEENT: Head: normal shape and size; Eyes: sclera white, no icterus, conjunctiva pink, PERRLA and EOMs intact; Ears: Tm's gray and intact, normal light reflex; Nose: mucosa pink and moist, septum midline; Throat/Mouth: Teeth present, mucosa pink and moist, no exudate, lesions or ulcerations noted.  Neck:  Neck supple, trachea midline. No masses, lumps or thyromegaly present.  Cardiovascular: Normal rate and rhythm. S1,S2 noted.  No murmur, rubs or gallops noted. No JVD or BLE edema. No carotid bruits noted. Pulmonary/Chest: Normal effort and positive vesicular breath sounds. No respiratory distress. No wheezes, rales or ronchi noted.  Abdomen: Soft and nontender. Normal bowel sounds. No distention or masses noted. Liver, spleen and kidneys non palpable. Musculoskeletal: Strength 4/5 BUE/BLE. No difficulty with gait.  Neurological: Alert and oriented.  Psychiatric: Mood and affect mildly flat. Behavior is normal. Judgment and thought content normal.    BMET    Component Value Date/Time   NA 141 03/16/2018 1537   NA 137 05/06/2012 0949   K 4.9 03/16/2018 1537   K 4.5 05/06/2012 0949   CL 102 03/16/2018 1537   CL 103 05/06/2012 0949   CO2 22 03/16/2018 1537   CO2 27 05/06/2012 0949   GLUCOSE 120 (H) 03/16/2018 1537   GLUCOSE 123 (H) 05/06/2012 0949   BUN 13 03/16/2018 1537   BUN 10 05/06/2012 0949   CREATININE 0.74 03/16/2018 1537   CREATININE  0.73 05/06/2012 0949   CALCIUM 10.2 03/16/2018 1537   CALCIUM 9.2 05/06/2012 0949   GFRNONAA 90 03/16/2018 1537   GFRNONAA >60 05/06/2012 0949   GFRAA 103 03/16/2018 1537   GFRAA >60 05/06/2012 0949    Lipid Panel     Component Value Date/Time   CHOL 135 02/28/2017 1604   TRIG 135 02/28/2017 1604   HDL 38 (L) 02/28/2017 1604   CHOLHDL 3.6 02/28/2017 1604   LDLCALC 70 02/28/2017 1604    CBC    Component Value Date/Time   WBC 11.8 (H) 03/16/2018 1537   WBC 11.7 (H) 05/06/2012 0949   RBC 5.38 (H) 03/16/2018 1537   RBC 5.01 05/06/2012 0949   HGB 15.6 03/16/2018 1537   HCT 47.5 (H) 03/16/2018 1537   PLT 314 03/16/2018 1537   MCV 88 03/16/2018 1537   MCV 88 05/06/2012 0949   MCH 29.0 03/16/2018 1537   MCH 28.7 05/06/2012 0949   MCHC 32.8 03/16/2018 1537   MCHC 32.7 05/06/2012 0949   RDW 14.1 03/16/2018 1537   RDW 13.7 05/06/2012 0949   LYMPHSABS 5.1 (H) 02/28/2017 1604   EOSABS 0.1 02/28/2017 1604   BASOSABS 0.1 02/28/2017 1604    Hgb A1C Lab Results  Component Value Date   HGBA1C 7.2 (A) 06/17/2018            Assessment & Plan:   Preventative Health Maintenance:  Encouraged her to get a flu shot UTD Some question about prevnar/pneumovax, will look further into this Mammogram and bone density ordered She does not need pap smears Colon screening UTD Encouraged her to consume a balanced diet and exercise regimen Advised her to see an eye doctor and dentist annually A1C today  RTC in 6 months, follow up DM 2 Webb Silversmith, NP

## 2018-06-26 NOTE — Patient Instructions (Signed)
Health Maintenance for Postmenopausal Women Menopause is a normal process in which your reproductive ability comes to an end. This process happens gradually over a span of months to years, usually between the ages of 22 and 9. Menopause is complete when you have missed 12 consecutive menstrual periods. It is important to talk with your health care provider about some of the most common conditions that affect postmenopausal women, such as heart disease, cancer, and bone loss (osteoporosis). Adopting a healthy lifestyle and getting preventive care can help to promote your health and wellness. Those actions can also lower your chances of developing some of these common conditions. What should I know about menopause? During menopause, you may experience a number of symptoms, such as:  Moderate-to-severe hot flashes.  Night sweats.  Decrease in sex drive.  Mood swings.  Headaches.  Tiredness.  Irritability.  Memory problems.  Insomnia.  Choosing to treat or not to treat menopausal changes is an individual decision that you make with your health care provider. What should I know about hormone replacement therapy and supplements? Hormone therapy products are effective for treating symptoms that are associated with menopause, such as hot flashes and night sweats. Hormone replacement carries certain risks, especially as you become older. If you are thinking about using estrogen or estrogen with progestin treatments, discuss the benefits and risks with your health care provider. What should I know about heart disease and stroke? Heart disease, heart attack, and stroke become more likely as you age. This may be due, in part, to the hormonal changes that your body experiences during menopause. These can affect how your body processes dietary fats, triglycerides, and cholesterol. Heart attack and stroke are both medical emergencies. There are many things that you can do to help prevent heart disease  and stroke:  Have your blood pressure checked at least every 1-2 years. High blood pressure causes heart disease and increases the risk of stroke.  If you are 53-22 years old, ask your health care provider if you should take aspirin to prevent a heart attack or a stroke.  Do not use any tobacco products, including cigarettes, chewing tobacco, or electronic cigarettes. If you need help quitting, ask your health care provider.  It is important to eat a healthy diet and maintain a healthy weight. ? Be sure to include plenty of vegetables, fruits, low-fat dairy products, and lean protein. ? Avoid eating foods that are high in solid fats, added sugars, or salt (sodium).  Get regular exercise. This is one of the most important things that you can do for your health. ? Try to exercise for at least 150 minutes each week. The type of exercise that you do should increase your heart rate and make you sweat. This is known as moderate-intensity exercise. ? Try to do strengthening exercises at least twice each week. Do these in addition to the moderate-intensity exercise.  Know your numbers.Ask your health care provider to check your cholesterol and your blood glucose. Continue to have your blood tested as directed by your health care provider.  What should I know about cancer screening? There are several types of cancer. Take the following steps to reduce your risk and to catch any cancer development as early as possible. Breast Cancer  Practice breast self-awareness. ? This means understanding how your breasts normally appear and feel. ? It also means doing regular breast self-exams. Let your health care provider know about any changes, no matter how small.  If you are 40  or older, have a clinician do a breast exam (clinical breast exam or CBE) every year. Depending on your age, family history, and medical history, it may be recommended that you also have a yearly breast X-ray (mammogram).  If you  have a family history of breast cancer, talk with your health care provider about genetic screening.  If you are at high risk for breast cancer, talk with your health care provider about having an MRI and a mammogram every year.  Breast cancer (BRCA) gene test is recommended for women who have family members with BRCA-related cancers. Results of the assessment will determine the need for genetic counseling and BRCA1 and for BRCA2 testing. BRCA-related cancers include these types: ? Breast. This occurs in males or females. ? Ovarian. ? Tubal. This may also be called fallopian tube cancer. ? Cancer of the abdominal or pelvic lining (peritoneal cancer). ? Prostate. ? Pancreatic.  Cervical, Uterine, and Ovarian Cancer Your health care provider may recommend that you be screened regularly for cancer of the pelvic organs. These include your ovaries, uterus, and vagina. This screening involves a pelvic exam, which includes checking for microscopic changes to the surface of your cervix (Pap test).  For women ages 21-65, health care providers may recommend a pelvic exam and a Pap test every three years. For women ages 79-65, they may recommend the Pap test and pelvic exam, combined with testing for human papilloma virus (HPV), every five years. Some types of HPV increase your risk of cervical cancer. Testing for HPV may also be done on women of any age who have unclear Pap test results.  Other health care providers may not recommend any screening for nonpregnant women who are considered low risk for pelvic cancer and have no symptoms. Ask your health care provider if a screening pelvic exam is right for you.  If you have had past treatment for cervical cancer or a condition that could lead to cancer, you need Pap tests and screening for cancer for at least 20 years after your treatment. If Pap tests have been discontinued for you, your risk factors (such as having a new sexual partner) need to be  reassessed to determine if you should start having screenings again. Some women have medical problems that increase the chance of getting cervical cancer. In these cases, your health care provider may recommend that you have screening and Pap tests more often.  If you have a family history of uterine cancer or ovarian cancer, talk with your health care provider about genetic screening.  If you have vaginal bleeding after reaching menopause, tell your health care provider.  There are currently no reliable tests available to screen for ovarian cancer.  Lung Cancer Lung cancer screening is recommended for adults 69-62 years old who are at high risk for lung cancer because of a history of smoking. A yearly low-dose CT scan of the lungs is recommended if you:  Currently smoke.  Have a history of at least 30 pack-years of smoking and you currently smoke or have quit within the past 15 years. A pack-year is smoking an average of one pack of cigarettes per day for one year.  Yearly screening should:  Continue until it has been 15 years since you quit.  Stop if you develop a health problem that would prevent you from having lung cancer treatment.  Colorectal Cancer  This type of cancer can be detected and can often be prevented.  Routine colorectal cancer screening usually begins at  age 42 and continues through age 45.  If you have risk factors for colon cancer, your health care provider may recommend that you be screened at an earlier age.  If you have a family history of colorectal cancer, talk with your health care provider about genetic screening.  Your health care provider may also recommend using home test kits to check for hidden blood in your stool.  A small camera at the end of a tube can be used to examine your colon directly (sigmoidoscopy or colonoscopy). This is done to check for the earliest forms of colorectal cancer.  Direct examination of the colon should be repeated every  5-10 years until age 71. However, if early forms of precancerous polyps or small growths are found or if you have a family history or genetic risk for colorectal cancer, you may need to be screened more often.  Skin Cancer  Check your skin from head to toe regularly.  Monitor any moles. Be sure to tell your health care provider: ? About any new moles or changes in moles, especially if there is a change in a mole's shape or color. ? If you have a mole that is larger than the size of a pencil eraser.  If any of your family members has a history of skin cancer, especially at a young age, talk with your health care provider about genetic screening.  Always use sunscreen. Apply sunscreen liberally and repeatedly throughout the day.  Whenever you are outside, protect yourself by wearing long sleeves, pants, a wide-brimmed hat, and sunglasses.  What should I know about osteoporosis? Osteoporosis is a condition in which bone destruction happens more quickly than new bone creation. After menopause, you may be at an increased risk for osteoporosis. To help prevent osteoporosis or the bone fractures that can happen because of osteoporosis, the following is recommended:  If you are 46-71 years old, get at least 1,000 mg of calcium and at least 600 mg of vitamin D per day.  If you are older than age 55 but younger than age 65, get at least 1,200 mg of calcium and at least 600 mg of vitamin D per day.  If you are older than age 54, get at least 1,200 mg of calcium and at least 800 mg of vitamin D per day.  Smoking and excessive alcohol intake increase the risk of osteoporosis. Eat foods that are rich in calcium and vitamin D, and do weight-bearing exercises several times each week as directed by your health care provider. What should I know about how menopause affects my mental health? Depression may occur at any age, but it is more common as you become older. Common symptoms of depression  include:  Low or sad mood.  Changes in sleep patterns.  Changes in appetite or eating patterns.  Feeling an overall lack of motivation or enjoyment of activities that you previously enjoyed.  Frequent crying spells.  Talk with your health care provider if you think that you are experiencing depression. What should I know about immunizations? It is important that you get and maintain your immunizations. These include:  Tetanus, diphtheria, and pertussis (Tdap) booster vaccine.  Influenza every year before the flu season begins.  Pneumonia vaccine.  Shingles vaccine.  Your health care provider may also recommend other immunizations. This information is not intended to replace advice given to you by your health care provider. Make sure you discuss any questions you have with your health care provider. Document Released: 12/13/2005  Document Revised: 05/10/2016 Document Reviewed: 07/25/2015 Elsevier Interactive Patient Education  2018 [employer].  

## 2018-06-29 ENCOUNTER — Encounter: Payer: Self-pay | Admitting: Internal Medicine

## 2018-07-01 ENCOUNTER — Encounter: Payer: Self-pay | Admitting: Internal Medicine

## 2018-07-01 DIAGNOSIS — R51 Headache: Secondary | ICD-10-CM

## 2018-07-01 DIAGNOSIS — R519 Headache, unspecified: Secondary | ICD-10-CM | POA: Insufficient documentation

## 2018-07-01 NOTE — Assessment & Plan Note (Signed)
Continue Lisinopril CMET reviewed

## 2018-07-01 NOTE — Assessment & Plan Note (Signed)
Deteriorated Support offered today Recently increase Celexa, will give this more time Continue Clonazepam prn

## 2018-07-01 NOTE — Assessment & Plan Note (Signed)
Continue Nortriptyline for now Will monitor

## 2018-07-01 NOTE — Assessment & Plan Note (Signed)
Continue Simvastatin for now Recent lipid reviewed Encouraged her to consume a low fat diet

## 2018-07-01 NOTE — Assessment & Plan Note (Signed)
A1C today No microalbumin secondary to ACEI Continue Glipizide, Lantus and Neurontin, will adjust if needed Encouraged her to consume a low carb diet and exercise for weight loss Foot exam today Encouraged yearly eye exams Encouraged flu shot in the fall She has had pneumonia vaccines Will likely not be seeing endo due to lack of insurance

## 2018-07-01 NOTE — Assessment & Plan Note (Signed)
Continue Clonazepam prn

## 2018-07-02 ENCOUNTER — Ambulatory Visit
Admission: RE | Admit: 2018-07-02 | Discharge: 2018-07-02 | Disposition: A | Discharge status: Home / Self Care | Payer: Managed Care, Other (non HMO) | Source: Ambulatory Visit | Attending: Internal Medicine | Admitting: Internal Medicine

## 2018-07-02 DIAGNOSIS — Z1231 Encounter for screening mammogram for malignant neoplasm of breast: Secondary | ICD-10-CM | POA: Insufficient documentation

## 2018-07-02 DIAGNOSIS — Z1239 Encounter for other screening for malignant neoplasm of breast: Secondary | ICD-10-CM

## 2018-07-03 ENCOUNTER — Ambulatory Visit: Payer: Managed Care, Other (non HMO) | Admitting: Internal Medicine

## 2018-07-26 ENCOUNTER — Other Ambulatory Visit: Payer: Self-pay | Admitting: Internal Medicine

## 2018-08-06 ENCOUNTER — Encounter: Payer: Self-pay | Admitting: Internal Medicine

## 2018-08-14 ENCOUNTER — Telehealth: Payer: Self-pay | Admitting: Internal Medicine

## 2018-08-14 NOTE — Telephone Encounter (Signed)
You put bone density order in on 06/26/18. Spoke with pt on 8/29 and she stated she wanted to hold off because she has no insurance and would call back when she wanted to schedule.  I spoke with her today 10/11 and she stated she has no insurance and didn't want to schedule right now.  Is it ok to cancel order

## 2018-08-14 NOTE — Telephone Encounter (Signed)
yes

## 2018-09-25 ENCOUNTER — Encounter: Payer: Self-pay | Admitting: Internal Medicine

## 2018-09-25 DIAGNOSIS — Z794 Long term (current) use of insulin: Principal | ICD-10-CM

## 2018-09-25 DIAGNOSIS — E1165 Type 2 diabetes mellitus with hyperglycemia: Secondary | ICD-10-CM

## 2018-09-30 ENCOUNTER — Ambulatory Visit: Payer: Managed Care, Other (non HMO) | Admitting: Internal Medicine

## 2018-11-04 DIAGNOSIS — R42 Dizziness and giddiness: Secondary | ICD-10-CM | POA: Insufficient documentation

## 2019-01-17 ENCOUNTER — Encounter: Payer: Self-pay | Admitting: Internal Medicine

## 2019-01-17 DIAGNOSIS — R202 Paresthesia of skin: Secondary | ICD-10-CM

## 2019-01-18 ENCOUNTER — Other Ambulatory Visit: Payer: Self-pay

## 2019-01-18 ENCOUNTER — Other Ambulatory Visit (INDEPENDENT_AMBULATORY_CARE_PROVIDER_SITE_OTHER): Payer: Self-pay

## 2019-01-18 DIAGNOSIS — E1165 Type 2 diabetes mellitus with hyperglycemia: Secondary | ICD-10-CM

## 2019-01-18 DIAGNOSIS — Z794 Long term (current) use of insulin: Secondary | ICD-10-CM

## 2019-01-18 LAB — POCT GLYCOSYLATED HEMOGLOBIN (HGB A1C): Hemoglobin A1C: 9.9 % — AB (ref 4.0–5.6)

## 2019-01-19 ENCOUNTER — Encounter: Payer: Self-pay | Admitting: Internal Medicine

## 2019-01-19 NOTE — Telephone Encounter (Signed)
Klonopin last filled 06/09/2018... please advise

## 2019-01-20 MED ORDER — CITALOPRAM HYDROBROMIDE 20 MG PO TABS: 20.0000 mg | ORAL_TABLET | Freq: Every day | ORAL | 3 refills | Status: DC

## 2019-01-20 MED ORDER — GLIPIZIDE 10 MG PO TABS: ORAL_TABLET | ORAL | 3 refills | Status: DC

## 2019-01-20 MED ORDER — SIMVASTATIN 20 MG PO TABS: ORAL_TABLET | ORAL | 3 refills | Status: DC

## 2019-01-20 MED ORDER — LISINOPRIL 5 MG PO TABS: 5.0000 mg | ORAL_TABLET | Freq: Every day | ORAL | 3 refills | Status: DC

## 2019-01-20 MED ORDER — GABAPENTIN 300 MG PO CAPS: 300.0000 mg | ORAL_CAPSULE | Freq: Four times a day (QID) | ORAL | 3 refills | Status: DC

## 2019-01-20 MED ORDER — CLONAZEPAM 0.5 MG PO TABS: ORAL_TABLET | ORAL | 0 refills | Status: DC

## 2019-01-20 MED ORDER — NORTRIPTYLINE HCL 10 MG PO CAPS: 20.0000 mg | ORAL_CAPSULE | Freq: Every day | ORAL | 3 refills | Status: DC

## 2019-01-28 NOTE — Telephone Encounter (Signed)
I have already filled meds, just going to work on pt assistance for medication

## 2019-04-07 ENCOUNTER — Other Ambulatory Visit: Payer: Self-pay | Admitting: Internal Medicine

## 2019-04-07 NOTE — Telephone Encounter (Signed)
Last filled 01/20/2019.Marland KitchenMarland KitchenMarland Kitchen please advise

## 2019-04-20 ENCOUNTER — Encounter: Payer: Self-pay | Admitting: Internal Medicine

## 2019-04-26 ENCOUNTER — Other Ambulatory Visit: Payer: Self-pay | Admitting: Internal Medicine

## 2019-04-26 ENCOUNTER — Encounter: Payer: Self-pay | Admitting: Internal Medicine

## 2019-04-27 MED ORDER — GLIPIZIDE 10 MG PO TABS: 10.0000 mg | ORAL_TABLET | Freq: Two times a day (BID) | ORAL | 1 refills | Status: DC

## 2019-04-27 MED ORDER — SIMVASTATIN 20 MG PO TABS: 20.0000 mg | ORAL_TABLET | Freq: Every day | ORAL | 1 refills | Status: DC

## 2019-04-27 MED ORDER — LISINOPRIL 5 MG PO TABS: 5.0000 mg | ORAL_TABLET | Freq: Every day | ORAL | 1 refills | Status: DC

## 2019-06-05 ENCOUNTER — Encounter: Payer: Self-pay | Admitting: Internal Medicine

## 2019-06-17 ENCOUNTER — Encounter: Payer: Self-pay | Admitting: Internal Medicine

## 2019-06-17 ENCOUNTER — Ambulatory Visit (INDEPENDENT_AMBULATORY_CARE_PROVIDER_SITE_OTHER): Payer: Self-pay | Admitting: Internal Medicine

## 2019-06-17 ENCOUNTER — Other Ambulatory Visit: Payer: Self-pay

## 2019-06-17 VITALS — BP 122/78 | HR 77 | Temp 98.3°F | Ht 63.5 in | Wt 175.0 lb

## 2019-06-17 DIAGNOSIS — E039 Hypothyroidism, unspecified: Secondary | ICD-10-CM

## 2019-06-17 DIAGNOSIS — F419 Anxiety disorder, unspecified: Secondary | ICD-10-CM

## 2019-06-17 DIAGNOSIS — F32A Depression, unspecified: Secondary | ICD-10-CM

## 2019-06-17 DIAGNOSIS — E1165 Type 2 diabetes mellitus with hyperglycemia: Secondary | ICD-10-CM

## 2019-06-17 DIAGNOSIS — E7800 Pure hypercholesterolemia, unspecified: Secondary | ICD-10-CM

## 2019-06-17 DIAGNOSIS — Z0001 Encounter for general adult medical examination with abnormal findings: Secondary | ICD-10-CM

## 2019-06-17 DIAGNOSIS — R51 Headache: Secondary | ICD-10-CM

## 2019-06-17 DIAGNOSIS — Z794 Long term (current) use of insulin: Secondary | ICD-10-CM

## 2019-06-17 DIAGNOSIS — R519 Headache, unspecified: Secondary | ICD-10-CM

## 2019-06-17 DIAGNOSIS — Z23 Encounter for immunization: Secondary | ICD-10-CM

## 2019-06-17 DIAGNOSIS — E559 Vitamin D deficiency, unspecified: Secondary | ICD-10-CM

## 2019-06-17 DIAGNOSIS — I10 Essential (primary) hypertension: Secondary | ICD-10-CM

## 2019-06-17 DIAGNOSIS — J383 Other diseases of vocal cords: Secondary | ICD-10-CM

## 2019-06-17 DIAGNOSIS — F329 Major depressive disorder, single episode, unspecified: Secondary | ICD-10-CM

## 2019-06-17 LAB — COMPREHENSIVE METABOLIC PANEL
ALT: 12 U/L (ref 0–35)
AST: 13 U/L (ref 0–37)
Albumin: 4.4 g/dL (ref 3.5–5.2)
Alkaline Phosphatase: 44 U/L (ref 39–117)
BUN: 9 mg/dL (ref 6–23)
CO2: 27 mEq/L (ref 19–32)
Calcium: 10.1 mg/dL (ref 8.4–10.5)
Chloride: 101 mEq/L (ref 96–112)
Creatinine, Ser: 0.59 mg/dL (ref 0.40–1.20)
GFR: 104.08 mL/min (ref >60.00)
Glucose, Bld: 84 mg/dL (ref 70–99)
Potassium: 4.4 mEq/L (ref 3.5–5.1)
Sodium: 137 mEq/L (ref 135–145)
Total Bilirubin: 0.4 mg/dL (ref 0.2–1.2)
Total Protein: 6.7 g/dL (ref 6.0–8.3)

## 2019-06-17 LAB — CBC
HCT: 43.9 % (ref 36.0–46.0)
Hemoglobin: 14.4 g/dL (ref 12.0–15.0)
MCHC: 32.9 g/dL (ref 30.0–36.0)
MCV: 88.1 fl (ref 78.0–100.0)
Platelets: 274 10*3/uL (ref 150.0–400.0)
RBC: 4.98 Mil/uL (ref 3.87–5.11)
RDW: 14.5 % (ref 11.5–15.5)
WBC: 12.6 10*3/uL — ABNORMAL HIGH (ref 4.0–10.5)

## 2019-06-17 LAB — T4, FREE: Free T4: 1.08 ng/dL (ref 0.60–1.60)

## 2019-06-17 LAB — LIPID PANEL
Cholesterol: 114 mg/dL (ref 0–200)
HDL: 40.5 mg/dL (ref >39.00)
LDL Cholesterol: 48 mg/dL (ref 0–99)
NonHDL: 73.02
Total CHOL/HDL Ratio: 3
Triglycerides: 125 mg/dL (ref 0.0–149.0)
VLDL: 25 mg/dL (ref 0.0–40.0)

## 2019-06-17 LAB — HEMOGLOBIN A1C: Hgb A1c MFr Bld: 9.2 % — ABNORMAL HIGH (ref 4.6–6.5)

## 2019-06-17 LAB — TSH: TSH: 1.08 u[IU]/mL (ref 0.35–4.50)

## 2019-06-17 LAB — VITAMIN D 25 HYDROXY (VIT D DEFICIENCY, FRACTURES): VITD: 30.98 ng/mL (ref 30.00–100.00)

## 2019-06-17 MED ORDER — BUSPIRONE HCL 5 MG PO TABS: 5.0000 mg | ORAL_TABLET | Freq: Two times a day (BID) | ORAL | 2 refills | Status: DC

## 2019-06-17 NOTE — Patient Instructions (Signed)

## 2019-06-17 NOTE — Progress Notes (Signed)
Subjective:    Patient ID: Brittany Pruitt, female    DOB: 1961-03-23, 58 y.o.   MRN: [id number]  HPI  Pt presents to the clinic today for her annual exam. She is also due to follow up chronic conditions.  Anxiety and Depression: She does not feel like her Citalopram and Clonazepam are controlling her anxiety as well as before. She is not seeing a therapist. She denies SI/HI.  DM 2 with Neuropathy: Her last A1C was 9.9%, 01/2019. Her sugars range 70-300. She is taking Glipizide, but is not taking her Lantus and Humalog as prescribed. She is also taking her husband's Metformin. She takes Gabapentin for neuropathic pain with good control. She checks her feet routinely.  Her last eye exam was 06/2017.  HTN: Her BP today is 122/78. She is taking Lisinopril as prescribed (mainly for kidney protection). There is no ECG on file.  HLD: Her last LDL was 70, 02/2017. She denies myalgias on Simvastatin. She tries to consume a low fat diet.  Frequent Headaches: Triggered by stress. She is taking Nortriptyline as prescribed with good resutls. She does not follow with neurology.   Spasmodic Dysphonia: Persistent. She takes Clonazepam as needed for this.  Flu: 08/2015 Tetanus: 06/2017 Prevnar: 05/2015 Pneumovax: never Pap Smear: total hysterectomy Mammogram: 06/2018, every 2 hours Colon Screening: 02/2016 Vision Screening: annually Dentist: biannually  Review of Systems  Past Medical History:  Diagnosis Date  . Chicken pox   . Depression   . Diabetes mellitus without complication ([place])   . Frequent headaches     Current Outpatient Medications  Medication Sig Dispense Refill  . B-D ULTRAFINE III SHORT PEN 31G X 8 MM MISC USE 1 PEN NEEDLE 4 TIMES  DAILY 80 each 2  . citalopram (CELEXA) 20 MG tablet Take 1 tablet (20 mg total) by mouth daily. 30 tablet 3  . clonazePAM (KLONOPIN) 0.5 MG tablet TAKE 1/2 TO 1 (ONE-HALF TO ONE) TABLET BY MOUTH TWICE DAILY AS NEEDED 45 tablet 0  . gabapentin  (NEURONTIN) 300 MG capsule Take 1 capsule (300 mg total) by mouth 4 (four) times daily. 120 capsule 3  . glipiZIDE (GLUCOTROL) 10 MG tablet Take 1 tablet (10 mg total) by mouth 2 (two) times daily before a meal. PHYSICAL DUE September 58 tablet 1  . HUMALOG KWIKPEN 100 UNIT/ML KiwkPen INJECT SUBCUTANEOUSLY 8  UNITS WITH BREAKFAST, 10  UNITS WITH LUNCH, 12 UNITS  WITH DINNER 30 mL 0  . Insulin Glargine (LANTUS SOLOSTAR) 100 UNIT/ML Solostar Pen INJECT SUBCUTANEOUSLY 40  UNITS DAILY AT 10PM 45 mL 1  . Insulin Pen Needle 29G X 12.7MM MISC 1 each by Does not apply route 4 (four) times daily. 400 each 1  . lisinopril (ZESTRIL) 5 MG tablet Take 1 tablet (5 mg total) by mouth daily. PHYSICAL DUE September 30 tablet 1  . nortriptyline (PAMELOR) 10 MG capsule Take 2 capsules (20 mg total) by mouth at bedtime. PHYSICAL DUE September 30 capsule 1  . ONE TOUCH ULTRA TEST test strip CHECK BLOOD SUGAR 4 TIMES  DAILY 200 each 11  . simvastatin (ZOCOR) 20 MG tablet Take 1 tablet (20 mg total) by mouth daily at 6 PM. PHYSICAL DUE September 30 tablet 1   No current facility-administered medications for this visit.     Allergies  Allergen Reactions  . Sulfa Antibiotics Hives    Family History  Problem Relation Age of Onset  . Uterine cancer Mother   . Breast cancer Mother 18  .  Pancreatic cancer Mother   . Heart disease Father   . Hypertension Father   . Anxiety disorder Father   . Diabetes Father   . Uterine cancer Maternal Aunt   . Breast cancer Maternal Aunt 60  . Heart disease Paternal Grandfather     Social History   Socioeconomic History  . Marital status: Married    Spouse name: Not on file  . Number of children: Not on file  . Years of education: Not on file  . Highest education level: Not on file  Occupational History  . Not on file  Social Needs  . Financial resource strain: Not on file  . Food insecurity    Worry: Not on file    Inability: Not on file  . Transportation needs     Medical: Not on file    Non-medical: Not on file  Tobacco Use  . Smoking status: Current Every Day Smoker    Packs/day: 0.25    Years: 30.00    Pack years: 7.50    Types: Cigarettes  . Smokeless tobacco: Never Used  Substance and Sexual Activity  . Alcohol use: No    Alcohol/week: 0.0 standard drinks  . Drug use: No  . Sexual activity: Never  Lifestyle  . Physical activity    Days per week: Not on file    Minutes per session: Not on file  . Stress: Not on file  Relationships  . Social Herbalist on phone: Not on file    Gets together: Not on file    Attends religious service: Not on file    Active member of club or organization: Not on file    Attends meetings of clubs or organizations: Not on file    Relationship status: Not on file  . Intimate partner violence    Fear of current or ex partner: Not on file    Emotionally abused: Not on file    Physically abused: Not on file    Forced sexual activity: Not on file  Other Topics Concern  . Not on file  Social History Narrative  . Not on file     Constitutional: Pt reports intermittent headaches. Denies fever, malaise, fatigue, or abrupt weight changes.  HEENT: Denies eye pain, eye redness, ear pain, ringing in the ears, wax buildup, runny nose, nasal congestion, bloody nose, or sore throat. Respiratory: Denies difficulty breathing, shortness of breath, cough or sputum production.   Cardiovascular: Denies chest pain, chest tightness, palpitations or swelling in the hands or feet.  Gastrointestinal: Pt reports loose stools. Denies abdominal pain, bloating, constipation, or blood in the stool.  GU: Denies urgency, frequency, pain with urination, burning sensation, blood in urine, odor or discharge. Musculoskeletal: Denies decrease in range of motion, difficulty with gait, muscle pain or joint pain and swelling.  Skin: Denies redness, rashes, lesions or ulcercations.  Neurological: Pt reports difficulty with  speech, dizziness. Denies difficulty with memory, or problems with balance and coordination.  Psych: Pt has a history of anxiety and depression. Denies SI/HI.  No other specific complaints in a complete review of systems (except as listed in HPI above).     Objective:   Physical Exam   BP 122/78   Pulse 77   Temp 98.3 F (36.8 C) (Temporal)   Ht 5' 3.5" (1.613 m)   Wt 175 lb (79.4 kg)   SpO2 97%   BMI 30.51 kg/m  Wt Readings from Last 3 Encounters:  06/17/19 175 lb (  79.4 kg)  06/26/18 197 lb (89.4 kg)  06/17/18 194 lb (88 kg)    General: Appears her stated age, obese, in NAD. Skin: Warm, dry and intact. No rashe noted. HEENT: Head: normal shape and size; Eyes: sclera white, no icterus, conjunctiva pink, PERRLA and EOMs intact; Ears: Tm's gray and intact, normal light reflex;  Neck:  Neck supple, trachea midline. No masses, lumps or thyromegaly present.  Cardiovascular: Normal rate and rhythm. S1,S2 noted.  No murmur, rubs or gallops noted. No JVD or BLE edema. No carotid bruits noted. Pulmonary/Chest: Normal effort and positive vesicular breath sounds. No respiratory distress. No wheezes, rales or ronchi noted.  Abdomen: Soft and nontender. Normal bowel sounds. No distention or masses noted. Liver, spleen and kidneys non palpable. Musculoskeletal: Strength 5/5 BUE/BLE. No difficulty with gait.  Neurological: Alert and oriented. Cranial nerves II-XII grossly intact. Coordination normal.  Psychiatric: Mildly anxious appearing. Behavior is normal. Judgment and thought content normal.   BMET    Component Value Date/Time   NA 137 06/17/2019 1316   NA 141 03/16/2018 1537   NA 137 05/06/2012 0949   K 4.4 06/17/2019 1316   K 4.5 05/06/2012 0949   CL 101 06/17/2019 1316   CL 103 05/06/2012 0949   CO2 27 06/17/2019 1316   CO2 27 05/06/2012 0949   GLUCOSE 84 06/17/2019 1316   GLUCOSE 123 (H) 05/06/2012 0949   BUN 9 06/17/2019 1316   BUN 13 03/16/2018 1537   BUN 10 05/06/2012  0949   CREATININE 0.59 06/17/2019 1316   CREATININE 0.73 05/06/2012 0949   CALCIUM 10.1 06/17/2019 1316   CALCIUM 9.2 05/06/2012 0949   GFRNONAA 90 03/16/2018 1537   GFRNONAA >60 05/06/2012 0949   GFRAA 103 03/16/2018 1537   GFRAA >60 05/06/2012 0949    Lipid Panel     Component Value Date/Time   CHOL 114 06/17/2019 1316   CHOL 135 02/28/2017 1604   TRIG 125.0 06/17/2019 1316   HDL 40.50 06/17/2019 1316   HDL 38 (L) 02/28/2017 1604   CHOLHDL 3 06/17/2019 1316   VLDL 25.0 06/17/2019 1316   LDLCALC 48 06/17/2019 1316   LDLCALC 70 02/28/2017 1604    CBC    Component Value Date/Time   WBC 12.6 (H) 06/17/2019 1316   RBC 4.98 06/17/2019 1316   HGB 14.4 06/17/2019 1316   HGB 15.6 03/16/2018 1537   HCT 43.9 06/17/2019 1316   HCT 47.5 (H) 03/16/2018 1537   PLT 274.0 06/17/2019 1316   PLT 314 03/16/2018 1537   MCV 88.1 06/17/2019 1316   MCV 88 03/16/2018 1537   MCV 88 05/06/2012 0949   MCH 29.0 03/16/2018 1537   MCH 28.7 05/06/2012 0949   MCHC 32.9 06/17/2019 1316   RDW 14.5 06/17/2019 1316   RDW 14.1 03/16/2018 1537   RDW 13.7 05/06/2012 0949   LYMPHSABS 5.1 (H) 02/28/2017 1604   EOSABS 0.1 02/28/2017 1604   BASOSABS 0.1 02/28/2017 1604    Hgb A1C Lab Results  Component Value Date   HGBA1C 9.2 (H) 06/17/2019          Assessment & Plan:   Preventative Health Maintenance:  Encouraged her to get a flu shot in the fall Tetanus UTD Prevnar UTD Pneumovax today Discussed Shingrix vaccine at 48 Mammogram due, she will call to schedule Does not need pap smear or pelvic exams Colon screening UTD Encouraged her to consume a balanced diet and exercise regimen Advised her to see an eye doctor or dentist  annually Will check CBC, CMET, TSH, Free T4 Lipid, A1C, urine microalbumin and Vit D today  RTC in 3 months to follow up chronic conditions Webb Silversmith, NP

## 2019-06-25 ENCOUNTER — Encounter: Payer: Self-pay | Admitting: Internal Medicine

## 2019-06-25 MED ORDER — METFORMIN HCL 1000 MG PO TABS: 1000.0000 mg | ORAL_TABLET | Freq: Two times a day (BID) | ORAL | 2 refills | Status: DC

## 2019-06-28 NOTE — Assessment & Plan Note (Signed)
Controlled on Lisinopril CBC and CMET today Reinforced DASH diet and exercise for weight loss 

## 2019-06-28 NOTE — Assessment & Plan Note (Signed)
CMET and Lipid profile today Encouraged her to consume a low fat diet Continue Simvastatin for now 

## 2019-06-28 NOTE — Assessment & Plan Note (Signed)
Deteriorated Continue Citalopram and Clonazepam Will trial Buspar 5 mg BID  Support offered today

## 2019-06-28 NOTE — Assessment & Plan Note (Signed)
Continue Nortriptyline for now

## 2019-06-28 NOTE — Assessment & Plan Note (Signed)
A1C and urine microalbumin today Encouraged her to consume a low carb diet and exercise for weight loss Continue Glipizide for now Foot exam today Encouraged yearly eye exam Encouraged flu shot in the fall Prevnar UTD Pneumovax given today

## 2019-06-28 NOTE — Assessment & Plan Note (Signed)
Continue Clonazepam prn

## 2019-07-01 ENCOUNTER — Other Ambulatory Visit: Payer: Self-pay

## 2019-07-01 DIAGNOSIS — F329 Major depressive disorder, single episode, unspecified: Secondary | ICD-10-CM

## 2019-07-01 DIAGNOSIS — R202 Paresthesia of skin: Secondary | ICD-10-CM

## 2019-07-01 DIAGNOSIS — F419 Anxiety disorder, unspecified: Secondary | ICD-10-CM

## 2019-07-01 MED ORDER — LISINOPRIL 5 MG PO TABS: 5.0000 mg | ORAL_TABLET | Freq: Every day | ORAL | 2 refills | Status: DC

## 2019-07-01 MED ORDER — BUSPIRONE HCL 5 MG PO TABS: 5.0000 mg | ORAL_TABLET | Freq: Two times a day (BID) | ORAL | 2 refills | Status: DC

## 2019-07-01 MED ORDER — METFORMIN HCL 1000 MG PO TABS: 1000.0000 mg | ORAL_TABLET | Freq: Two times a day (BID) | ORAL | 2 refills | Status: DC

## 2019-07-01 MED ORDER — GABAPENTIN 300 MG PO CAPS: 300.0000 mg | ORAL_CAPSULE | Freq: Four times a day (QID) | ORAL | 2 refills | Status: DC

## 2019-07-01 MED ORDER — NORTRIPTYLINE HCL 10 MG PO CAPS: 20.0000 mg | ORAL_CAPSULE | Freq: Every day | ORAL | 2 refills | Status: DC

## 2019-07-01 MED ORDER — GLIPIZIDE 10 MG PO TABS: 10.0000 mg | ORAL_TABLET | Freq: Two times a day (BID) | ORAL | 2 refills | Status: DC

## 2019-07-01 MED ORDER — SIMVASTATIN 20 MG PO TABS: 20.0000 mg | ORAL_TABLET | Freq: Every day | ORAL | 1 refills | Status: DC

## 2019-07-01 MED ORDER — CITALOPRAM HYDROBROMIDE 20 MG PO TABS: 20.0000 mg | ORAL_TABLET | Freq: Every day | ORAL | 2 refills | Status: DC

## 2019-09-03 ENCOUNTER — Encounter: Payer: Self-pay | Admitting: Internal Medicine

## 2019-09-03 ENCOUNTER — Other Ambulatory Visit: Payer: Self-pay

## 2019-09-03 ENCOUNTER — Ambulatory Visit (INDEPENDENT_AMBULATORY_CARE_PROVIDER_SITE_OTHER): Payer: Self-pay | Admitting: Internal Medicine

## 2019-09-03 VITALS — BP 122/78 | HR 74 | Temp 98.0°F | Wt 169.0 lb

## 2019-09-03 DIAGNOSIS — R42 Dizziness and giddiness: Secondary | ICD-10-CM

## 2019-09-03 DIAGNOSIS — Z794 Long term (current) use of insulin: Secondary | ICD-10-CM

## 2019-09-03 DIAGNOSIS — R296 Repeated falls: Secondary | ICD-10-CM

## 2019-09-03 DIAGNOSIS — H81399 Other peripheral vertigo, unspecified ear: Secondary | ICD-10-CM

## 2019-09-03 DIAGNOSIS — E1165 Type 2 diabetes mellitus with hyperglycemia: Secondary | ICD-10-CM

## 2019-09-03 DIAGNOSIS — R2689 Other abnormalities of gait and mobility: Secondary | ICD-10-CM

## 2019-09-03 DIAGNOSIS — Z23 Encounter for immunization: Secondary | ICD-10-CM

## 2019-09-03 LAB — POCT GLYCOSYLATED HEMOGLOBIN (HGB A1C): Hemoglobin A1C: 8 % — AB (ref 4.0–5.6)

## 2019-09-03 MED ORDER — MECLIZINE HCL 25 MG PO TABS: 25.0000 mg | ORAL_TABLET | Freq: Three times a day (TID) | ORAL | 0 refills | Status: DC | PRN

## 2019-09-03 NOTE — Progress Notes (Signed)
Acute Office Visit  Subjective:    Patient ID: Brittany Pruitt, female    DOB: August 18, 1961, 58 y.o.   MRN: [id number]  Chief Complaint  Patient presents with  . Follow-up    HPI Patient presents today for follow-up of dizziness, loss of balance and wants to get her A1C checked. She mentions her dizziness is constant and has been going on for over a year but has recently gotten worse. She mentions her first episode of dizziness occurred while driving and made her feel that everything was moving around her. She states that it is worse with head movement. She states it is difficult to describe and feels like her body seems to be moving which as caused mile nausea. She has fallen twice this week with one episode occurring Sunday night and the other on Tuesday morning. She mentions she has light ringing in both ears without pain or discharge. She states she has muscle weakness and has lost weight in the last 6 months. She denies fevers, SOB or chest pain.   Patient has a history of HTN, DM2, spasmodic dysphonia Hyperlipidemia, Anxiety, Depression and headaches.  Past Medical History:  Diagnosis Date  . Chicken pox   . Depression   . Diabetes mellitus without complication ([place])   . Frequent headaches     Past Surgical History:  Procedure Laterality Date  . ABDOMINAL HYSTERECTOMY  2013   total  . BREAST BIOPSY Left 2013   benign  . CESAREAN SECTION    . COLONOSCOPY WITH PROPOFOL N/A 02/09/2016   Procedure: COLONOSCOPY WITH PROPOFOL;  Surgeon: Hulen Luster, MD;  Location: [facility] ENDOSCOPY;  Service: Gastroenterology;  Laterality: N/A;  . TONSILLECTOMY AND ADENOIDECTOMY      Family History  Problem Relation Age of Onset  . Uterine cancer Mother   . Breast cancer Mother 55  . Pancreatic cancer Mother   . Heart disease Father   . Hypertension Father   . Anxiety disorder Father   . Diabetes Father   . Uterine cancer Maternal Aunt   . Breast cancer Maternal Aunt 60  . Heart disease  Paternal Grandfather     Social History   Socioeconomic History  . Marital status: Married    Spouse name: Not on file  . Number of children: Not on file  . Years of education: Not on file  . Highest education level: Not on file  Occupational History  . Not on file  Social Needs  . Financial resource strain: Not on file  . Food insecurity    Worry: Not on file    Inability: Not on file  . Transportation needs    Medical: Not on file    Non-medical: Not on file  Tobacco Use  . Smoking status: Current Every Day Smoker    Packs/day: 0.25    Years: 30.00    Pack years: 7.50    Types: Cigarettes  . Smokeless tobacco: Never Used  Substance and Sexual Activity  . Alcohol use: No    Alcohol/week: 0.0 standard drinks  . Drug use: No  . Sexual activity: Never  Lifestyle  . Physical activity    Days per week: Not on file    Minutes per session: Not on file  . Stress: Not on file  Relationships  . Social Herbalist on phone: Not on file    Gets together: Not on file    Attends religious service: Not on file    Active member  of club or organization: Not on file    Attends meetings of clubs or organizations: Not on file    Relationship status: Not on file  . Intimate partner violence    Fear of current or ex partner: Not on file    Emotionally abused: Not on file    Physically abused: Not on file    Forced sexual activity: Not on file  Other Topics Concern  . Not on file  Social History Narrative  . Not on file    Outpatient Medications Prior to Visit  Medication Sig Dispense Refill  . B-D ULTRAFINE III SHORT PEN 31G X 8 MM MISC USE 1 PEN NEEDLE 4 TIMES  DAILY 80 each 2  . busPIRone (BUSPAR) 5 MG tablet Take 1 tablet (5 mg total) by mouth 2 (two) times daily. 60 tablet 2  . citalopram (CELEXA) 20 MG tablet Take 1 tablet (20 mg total) by mouth daily. 30 tablet 2  . clonazePAM (KLONOPIN) 0.5 MG tablet TAKE 1/2 TO 1 (ONE-HALF TO ONE) TABLET BY MOUTH TWICE DAILY  AS NEEDED 45 tablet 0  . gabapentin (NEURONTIN) 300 MG capsule Take 1 capsule (300 mg total) by mouth 4 (four) times daily. 120 capsule 2  . glipiZIDE (GLUCOTROL) 10 MG tablet Take 1 tablet (10 mg total) by mouth 2 (two) times daily before a meal. 60 tablet 2  . lisinopril (ZESTRIL) 5 MG tablet Take 1 tablet (5 mg total) by mouth daily. 30 tablet 2  . metFORMIN (GLUCOPHAGE) 1000 MG tablet Take 1 tablet (1,000 mg total) by mouth 2 (two) times daily with a meal. 60 tablet 2  . nortriptyline (PAMELOR) 10 MG capsule Take 2 capsules (20 mg total) by mouth at bedtime. 30 capsule 2  . ONE TOUCH ULTRA TEST test strip CHECK BLOOD SUGAR 4 TIMES  DAILY 200 each 11  . simvastatin (ZOCOR) 20 MG tablet Take 1 tablet (20 mg total) by mouth daily at 6 PM. PHYSICAL DUE September 30 tablet 1   No facility-administered medications prior to visit.     Allergies  Allergen Reactions  . Sulfa Antibiotics Hives    ROS   Constitutional: Endorses weight change and weakness, denies fever, malaise, fatigue, current headache  HEENT: Endorses ear ringing in both ears. Denies blurred vision. Denies eye pain, eye redness, ear pain, wax buildup, runny nose, nasal congestion, bloody nose, or sore throat. Respiratory: Denies difficulty breathing, shortness of breath, cough or sputum production.   Cardiovascular: Denies chest pain, chest tightness, palpitations or swelling in the hands or feet.  Gastrointestinal: Denies abdominal pain, bloating, constipation, diarrhea or blood in the stool.  GU: Denies frequency, urgency, pain with urination, blood in urine, odor or discharge. Musculoskeletal: Endorses muscle weakness. Denies decrease in range of motion, muscle pain or joint pain and swelling.  Skin: Denies redness, rashes, lesions or ulcercations.  Neurological: Endorses dizziness and problems with gait and coordination. Denies difficulty with memory or difficulty with speech. Psych: Denies feeling anxious at this time.  Denies depression, SI/HI.    Objective:    Physical Exam   General: Appears her stated age in NAD. HEENT: Head: normal shape and size; Eyes: sclera white, no icterus, conjunctiva pink, Ears: normal appearing external anatomy, external canal is non erythematous, bilateral tympanic membranes intact and pearly gray in appearance. PERRLA and EOMs intact; Voice notably strained. Cardiovascular: Normal rate and rhythm. S1,S2 noted.  No murmur, rubs or gallops noted. No JVD or BLE edema. No carotid bruits noted. Pulmonary/Chest:  Normal effort and positive vesicular breath sounds. No respiratory distress. No wheezes, rales or ronchi noted.  Musculoskeletal: 4/5 strength on upper and lower extremities.  Neurological: Loss of balance during gait (toe-toe, heel walk). Mild swaying during romberg assessment. Negative pronator drift.  Alert and oriented. Cranial nerves II-XII grossly intact. Sensation intact to bilateral upper and lower extremities. Psychiatric: Mood and affect normal. Behavior is normal. Judgment and thought content normal.   There were no vitals taken for this visit. Wt Readings from Last 3 Encounters:  06/17/19 79.4 kg  06/26/18 89.4 kg  06/17/18 88 kg    Health Maintenance Due  Topic Date Due  . Hepatitis C Screening  1960/12/16  . HIV Screening  10/08/1974  . INFLUENZA VACCINE  06/05/2019  . OPHTHALMOLOGY EXAM  06/05/2019    There are no preventive care reminders to display for this patient.   Lab Results  Component Value Date   TSH 1.08 06/17/2019   Lab Results  Component Value Date   WBC 12.6 (H) 06/17/2019   HGB 14.4 06/17/2019   HCT 43.9 06/17/2019   MCV 88.1 06/17/2019   PLT 274.0 06/17/2019   Lab Results  Component Value Date   NA 137 06/17/2019   K 4.4 06/17/2019   CO2 27 06/17/2019   GLUCOSE 84 06/17/2019   BUN 9 06/17/2019   CREATININE 0.59 06/17/2019   BILITOT 0.4 06/17/2019   ALKPHOS 44 06/17/2019   AST 13 06/17/2019   ALT 12 06/17/2019    PROT 6.7 06/17/2019   ALBUMIN 4.4 06/17/2019   CALCIUM 10.1 06/17/2019   ANIONGAP 7 05/06/2012   GFR 104.08 06/17/2019   Lab Results  Component Value Date   CHOL 114 06/17/2019   Lab Results  Component Value Date   HDL 40.50 06/17/2019   Lab Results  Component Value Date   LDLCALC 48 06/17/2019   Lab Results  Component Value Date   TRIG 125.0 06/17/2019   Lab Results  Component Value Date   CHOLHDL 3 06/17/2019   Lab Results  Component Value Date   HGBA1C 9.2 (H) 06/17/2019       Assessment & Plan:   [place] Weakness  Will obtain brain MRI to rule out any central causes or malignancy May refer to neurologist for EMG testing depending on MRI results Advised patient to make slow position changes Flonase 1 x in AM for symptomatic treatment   Problem List Items Addressed This Visit    None       No orders of the defined types were placed in this encounter.    Leward Quan, Student-PA

## 2019-09-05 NOTE — Patient Instructions (Signed)

## 2019-09-05 NOTE — Progress Notes (Signed)
HPI  Patient presents to the clinic today with c/o of dizziness, loss of balance. She mentions her dizziness is constant and has been going on for over a year but has recently gotten worse. She mentions her first episode of dizziness occurred while driving and made her feel that everything was moving around her. She states that it is worse with head movement. She has fallen twice this week with one episode occurring Sunday night and the other on Tuesday morning. She mentions she has light ringing in both ears without pain or discharge. She states she has muscle weakness and has lost weight in the last 6 months. She denies fevers, SOB or chest pain.   She would also like to get her A1C checked. Her last A1C was 9.2, 06/2019. She is taking Metformin and Glipizide as prescribed. She has not been checking her sugar lately.   Past Medical History:  Diagnosis Date  . Chicken pox   . Depression   . Diabetes mellitus without complication ([place])   . Frequent headaches     Current Outpatient Medications  Medication Sig Dispense Refill  . busPIRone (BUSPAR) 5 MG tablet Take 1 tablet (5 mg total) by mouth 2 (two) times daily. 60 tablet 2  . citalopram (CELEXA) 20 MG tablet Take 1 tablet (20 mg total) by mouth daily. 30 tablet 2  . gabapentin (NEURONTIN) 300 MG capsule Take 1 capsule (300 mg total) by mouth 4 (four) times daily. 120 capsule 2  . glipiZIDE (GLUCOTROL) 10 MG tablet Take 1 tablet (10 mg total) by mouth 2 (two) times daily before a meal. 60 tablet 2  . lisinopril (ZESTRIL) 5 MG tablet Take 1 tablet (5 mg total) by mouth daily. 30 tablet 2  . metFORMIN (GLUCOPHAGE) 1000 MG tablet Take 1 tablet (1,000 mg total) by mouth 2 (two) times daily with a meal. 60 tablet 2  . nortriptyline (PAMELOR) 10 MG capsule Take 2 capsules (20 mg total) by mouth at bedtime. 30 capsule 2  . ONE TOUCH ULTRA TEST test strip CHECK BLOOD SUGAR 4 TIMES  DAILY 200 each 11  . simvastatin (ZOCOR) 20 MG tablet Take 1 tablet  (20 mg total) by mouth daily at 6 PM. PHYSICAL DUE September 30 tablet 1  . meclizine (ANTIVERT) 25 MG tablet Take 1 tablet (25 mg total) by mouth 3 (three) times daily as needed for dizziness. 30 tablet 0   No current facility-administered medications for this visit.     Allergies  Allergen Reactions  . Sulfa Antibiotics Hives    Family History  Problem Relation Age of Onset  . Uterine cancer Mother   . Breast cancer Mother 59  . Pancreatic cancer Mother   . Heart disease Father   . Hypertension Father   . Anxiety disorder Father   . Diabetes Father   . Uterine cancer Maternal Aunt   . Breast cancer Maternal Aunt 60  . Heart disease Paternal Grandfather     Social History   Socioeconomic History  . Marital status: Married    Spouse name: Not on file  . Number of children: Not on file  . Years of education: Not on file  . Highest education level: Not on file  Occupational History  . Not on file  Social Needs  . Financial resource strain: Not on file  . Food insecurity    Worry: Not on file    Inability: Not on file  . Transportation needs    Medical: Not on  file    Non-medical: Not on file  Tobacco Use  . Smoking status: Current Every Day Smoker    Packs/day: 0.25    Years: 30.00    Pack years: 7.50    Types: Cigarettes  . Smokeless tobacco: Never Used  Substance and Sexual Activity  . Alcohol use: No    Alcohol/week: 0.0 standard drinks  . Drug use: No  . Sexual activity: Never  Lifestyle  . Physical activity    Days per week: Not on file    Minutes per session: Not on file  . Stress: Not on file  Relationships  . Social Herbalist on phone: Not on file    Gets together: Not on file    Attends religious service: Not on file    Active member of club or organization: Not on file    Attends meetings of clubs or organizations: Not on file    Relationship status: Not on file  . Intimate partner violence    Fear of current or ex partner: Not  on file    Emotionally abused: Not on file    Physically abused: Not on file    Forced sexual activity: Not on file  Other Topics Concern  . Not on file  Social History Narrative  . Not on file    ROS:  Constitutional: Pt reports fatigue. Denies fever, malaise, headache or abrupt weight changes.  HEENT: Denies eye pain, eye redness, ear pain, ringing in the ears, wax buildup, runny nose, nasal congestion, bloody nose, or sore throat. Respiratory: Denies difficulty breathing, shortness of breath, cough or sputum production.   Cardiovascular: Denies chest pain, chest tightness, palpitations or swelling in the hands or feet.  Musculoskeletal: Pt reports multiple falls. Denies decrease in range of motion, difficulty with gait, muscle pain or joint pain and swelling.  Neurological: Pt reports dizziness, loss of balance. Denies difficulty with memory, difficulty with speech.    No other specific complaints in a complete review of systems (except as listed in HPI above).  PE:  BP 122/78   Pulse 74   Temp 98 F (36.7 C) (Temporal)   Wt 76.7 kg   SpO2 97%   BMI 29.47 kg/m  Wt Readings from Last 3 Encounters:  09/03/19 76.7 kg  06/17/19 79.4 kg  06/26/18 89.4 kg    General: Appears her stated age, chronically ill appearing,  in NAD. HEENT: Head: normal shape and size; Eyes: sclera white, no icterus, conjunctiva pink, PERRLA and EOMs intact; Ears: Tm's gray and intact, normal light reflex; Cardiovascular: Normal rate and rhythm. S1,S2 noted.  No murmur, rubs or gallops noted.  Pulmonary/Chest: Normal effort and positive vesicular breath sounds. No respiratory distress. No wheezes, rales or ronchi noted.  Musculoskeletal: She is unable to perform tandem gait. She is unable to walk on tiptoes but can walk on heels. Gait slow and steady without device.  Neurological: Alert and oriented. Cranial nerves II-XII grossly intact. Coordination normal.    BMET    Component Value Date/Time    NA 137 06/17/2019 1316   NA 141 03/16/2018 1537   NA 137 05/06/2012 0949   K 4.4 06/17/2019 1316   K 4.5 05/06/2012 0949   CL 101 06/17/2019 1316   CL 103 05/06/2012 0949   CO2 27 06/17/2019 1316   CO2 27 05/06/2012 0949   GLUCOSE 84 06/17/2019 1316   GLUCOSE 123 (H) 05/06/2012 0949   BUN 9 06/17/2019 1316   BUN 13  03/16/2018 1537   BUN 10 05/06/2012 0949   CREATININE 0.59 06/17/2019 1316   CREATININE 0.73 05/06/2012 0949   CALCIUM 10.1 06/17/2019 1316   CALCIUM 9.2 05/06/2012 0949   GFRNONAA 90 03/16/2018 1537   GFRNONAA >60 05/06/2012 0949   GFRAA 103 03/16/2018 1537   GFRAA >60 05/06/2012 0949    Lipid Panel     Component Value Date/Time   CHOL 114 06/17/2019 1316   CHOL 135 02/28/2017 1604   TRIG 125.0 06/17/2019 1316   HDL 40.50 06/17/2019 1316   HDL 38 (L) 02/28/2017 1604   CHOLHDL 3 06/17/2019 1316   VLDL 25.0 06/17/2019 1316   LDLCALC 48 06/17/2019 1316   LDLCALC 70 02/28/2017 1604    CBC    Component Value Date/Time   WBC 12.6 (H) 06/17/2019 1316   RBC 4.98 06/17/2019 1316   HGB 14.4 06/17/2019 1316   HGB 15.6 03/16/2018 1537   HCT 43.9 06/17/2019 1316   HCT 47.5 (H) 03/16/2018 1537   PLT 274.0 06/17/2019 1316   PLT 314 03/16/2018 1537   MCV 88.1 06/17/2019 1316   MCV 88 03/16/2018 1537   MCV 88 05/06/2012 0949   MCH 29.0 03/16/2018 1537   MCH 28.7 05/06/2012 0949   MCHC 32.9 06/17/2019 1316   RDW 14.5 06/17/2019 1316   RDW 14.1 03/16/2018 1537   RDW 13.7 05/06/2012 0949   LYMPHSABS 5.1 (H) 02/28/2017 1604   EOSABS 0.1 02/28/2017 1604   BASOSABS 0.1 02/28/2017 1604    Hgb A1C Lab Results  Component Value Date   HGBA1C 8.0 (A) 09/03/2019     Assessment and Plan:  Dizziness, Loss of Balance, Multiple Falls, BPPV:  Will obtain MRI brain Consider referral to neurology for further evaluation Make position changes slowly RX for Meclizine 25 mg TID prn  DM 2:  A1C today- she understands it has been less than 3 months since she has  had and A1C and will likely be charged for this.   Will follow up after imaging and lab results, ER precautions discussed Webb Silversmith, NP

## 2019-09-08 ENCOUNTER — Encounter: Payer: Self-pay | Admitting: Internal Medicine

## 2019-09-14 ENCOUNTER — Telehealth: Payer: Self-pay

## 2019-09-14 NOTE — Telephone Encounter (Signed)
Copied from [place] [id number]. Topic: Referral - Status >> Sep 14, 2019  [id number] PM Simone Curia D wrote: [phone] Left message on voicemail for patient to return my call regarding financial resources for Januvia and insulin.  Emailed Januvia PAP application to Webb Silversmith, NP.  Ambrose Mantle [phone]

## 2019-09-15 ENCOUNTER — Telehealth: Payer: Self-pay

## 2019-09-15 ENCOUNTER — Other Ambulatory Visit: Payer: Self-pay

## 2019-09-15 ENCOUNTER — Ambulatory Visit
Admission: RE | Admit: 2019-09-15 | Discharge: 2019-09-15 | Disposition: A | Discharge status: Home / Self Care | Payer: Self-pay | Source: Ambulatory Visit | Attending: Internal Medicine | Admitting: Internal Medicine

## 2019-09-15 DIAGNOSIS — H81399 Other peripheral vertigo, unspecified ear: Secondary | ICD-10-CM | POA: Insufficient documentation

## 2019-09-15 DIAGNOSIS — R42 Dizziness and giddiness: Secondary | ICD-10-CM | POA: Insufficient documentation

## 2019-09-15 NOTE — Telephone Encounter (Signed)
Copied from [place] [id number]. Topic: Referral - Status >> Sep 15, 2019  [id number] AM Simone Curia D wrote: [phone] Spoke with patient about coming in the office to sign her PAP application for Januvia and gave her information about the [facility]. Also spoke to her about the [facility] for [facility]. Ambrose Mantle [phone]

## 2019-09-23 ENCOUNTER — Encounter: Payer: Self-pay | Admitting: Internal Medicine

## 2019-09-23 ENCOUNTER — Other Ambulatory Visit: Payer: Self-pay | Admitting: Internal Medicine

## 2019-09-23 DIAGNOSIS — H9319 Tinnitus, unspecified ear: Secondary | ICD-10-CM

## 2019-09-23 DIAGNOSIS — R2689 Other abnormalities of gait and mobility: Secondary | ICD-10-CM

## 2019-09-24 ENCOUNTER — Telehealth: Payer: Self-pay

## 2019-09-24 NOTE — Telephone Encounter (Signed)
Copied from [place] [id number]. Topic: Referral - Status >> Sep 24, 2019  [id number] PM Simone Curia D wrote: [phone] Follow-up call.  Left message on voicemail for patient to return my call about financial assistance with medications. Ambrose Mantle [phone]

## 2019-10-05 ENCOUNTER — Encounter: Payer: Self-pay | Admitting: Neurology

## 2019-10-05 ENCOUNTER — Other Ambulatory Visit: Payer: Self-pay

## 2019-10-05 ENCOUNTER — Ambulatory Visit (INDEPENDENT_AMBULATORY_CARE_PROVIDER_SITE_OTHER): Payer: Self-pay | Admitting: Neurology

## 2019-10-05 VITALS — BP 127/83 | HR 77 | Temp 97.4°F | Ht 63.5 in | Wt 170.2 lb

## 2019-10-05 DIAGNOSIS — R42 Dizziness and giddiness: Secondary | ICD-10-CM

## 2019-10-05 DIAGNOSIS — E0842 Diabetes mellitus due to underlying condition with diabetic polyneuropathy: Secondary | ICD-10-CM

## 2019-10-05 DIAGNOSIS — R2689 Other abnormalities of gait and mobility: Secondary | ICD-10-CM

## 2019-10-05 NOTE — Patient Instructions (Signed)
I believe, your balance problem is due to a combination of factors, including neuropathy, likely diabetic and possibly also side effects from taking medication such as gabapentin and Pamelor.  Please talk to Rollene Fare about potentially tapering off the nortriptyline as it can cause side effects including mouth dryness, eye dryness, both of which you have and balance problem.  I would not recommend increasing the gabapentin, you are taking 1200 mg daily.  It can also affect your balance.  It can make you sleepy and you are also experiencing sleepiness from it.  Please keep your appointment with your ENT specialist.  Please try to hydrate well with water, about 8 cups/day are recommended. Good diabetes control will be key.Your A1c has come down recently.  Please keep up the good work with your diabetes control.  Nevertheless, we will also check for additional potentially treatable causes of neuropathy with blood work today, we will call you with the results.

## 2019-10-05 NOTE — Progress Notes (Signed)
Subjective:    Patient ID: Brittany Pruitt is a 58 y.o. female.  HPI     Star Age, MD, PhD [facility] [address] [place], [place] [address]  Dear Rollene Fare,  I saw your patient, Brittany Pruitt, upon your kind request in the neurologic clinic today for initial consultation of her balance problem.  The patient is accompanied by her husband today.  As you know, [name] is a 58 year old right-handed woman with an underlying medical history of diabetes, neuropathy (for which she has been followed by Dr. Melrose Nakayama in neurology since last year), depression, Spasmodic dysphonia, and overweight state, who reports progressive issues with her balance for the past 2 years.  She has fallen.  She has not been using a walking aid such as a cane or walker but has installed a shower chair at home.  She feels dizzy at times and sometimes it feels like she is pulled in one direction especially if she changes her position quickly or turns her head quickly.  She has had ringing in her ears for the past 2 years almost, at least 1-1/2 years.  She has seen ENT in the past for her spasmodic dysphonia and has undergone Botox injections on at least 2 occasions with different doctors, most recently at [facility].  She did not notice much in the way of improvement and eventually gave up on those injections.  She has been working on diabetes control, most recent A1c was above 8, prior to that was 9.  She tries to take care of her diet and tries to hydrate well with water.  She does smoke and finds it currently quite hard to quit smoking secondary to stress.  She does not drink alcohol.  She drinks caffeine and limitation.  She does not work.  She reports numbness and stinging sensation in her hands and feet and distal legs, up to the mid calf areas and in her hands up to above her wrist areas.  Her husband reports that she has cut herself and not realize it and she has even burned  herself at the stove and not realize it.  She was told that she had B12 deficiency and should get B12 injections in the past, but she did not actually pursue it.  She started taking an oral B12 supplement but is currently no longer taking that.  She had blood work through your office recently and was told that her vitamin D level was on the low end of the spectrum.  I reviewed the blood test results from August.  She also had thyroid function done. She had an EMG and Nerve conduction test on 04/22/2018 in the lower extremities and 04/28/2018 in the upper extremities, which was negative for large fiber neuropathy and mild evidence of carpal tunnel was seen.  She has had tinnitus, she has been referred to ENT with appointment pending for this month. She had a recent brain MRI without contrast on 09/15/2019 and I reviewed the results: IMPRESSION: Normal MRI appearance of the brain for age. No evidence of acute intracranial abnormality.   She is currently on gabapentin 300 mg 4 times a day and nortriptyline 20 mg daily.  She does become sleepy after taking the gabapentin.She has experienced mouth dryness and eye dryness.   Her Past Medical History Is Significant For: Past Medical History:  Diagnosis Date  . Chicken pox   . Depression   . Diabetes mellitus without complication ([place])   .  Frequent headaches     Her Past Surgical History Is Significant For: Past Surgical History:  Procedure Laterality Date  . ABDOMINAL HYSTERECTOMY  2013   total  . BREAST BIOPSY Left 2013   benign  . CESAREAN SECTION    . COLONOSCOPY WITH PROPOFOL N/A 02/09/2016   Procedure: COLONOSCOPY WITH PROPOFOL;  Surgeon: Hulen Luster, MD;  Location: [facility] ENDOSCOPY;  Service: Gastroenterology;  Laterality: N/A;  . TONSILLECTOMY AND ADENOIDECTOMY      Her Family History Is Significant For: Family History  Problem Relation Age of Onset  . Uterine cancer Mother   . Breast cancer Mother 49  . Pancreatic cancer Mother   .  Heart disease Father   . Hypertension Father   . Anxiety disorder Father   . Diabetes Father   . Uterine cancer Maternal Aunt   . Breast cancer Maternal Aunt 60  . Heart disease Paternal Grandfather     Her Social History Is Significant For: Social History   Socioeconomic History  . Marital status: Married    Spouse name: Not on file  . Number of children: Not on file  . Years of education: Not on file  . Highest education level: Not on file  Occupational History  . Not on file  Social Needs  . Financial resource strain: Not on file  . Food insecurity    Worry: Not on file    Inability: Not on file  . Transportation needs    Medical: Not on file    Non-medical: Not on file  Tobacco Use  . Smoking status: Current Every Day Smoker    Packs/day: 0.75    Years: 30.00    Pack years: 22.50    Types: Cigarettes  . Smokeless tobacco: Never Used  Substance and Sexual Activity  . Alcohol use: No    Alcohol/week: 0.0 standard drinks  . Drug use: No  . Sexual activity: Never  Lifestyle  . Physical activity    Days per week: Not on file    Minutes per session: Not on file  . Stress: Not on file  Relationships  . Social Herbalist on phone: Not on file    Gets together: Not on file    Attends religious service: Not on file    Active member of club or organization: Not on file    Attends meetings of clubs or organizations: Not on file    Relationship status: Not on file  Other Topics Concern  . Not on file  Social History Narrative   Pt lives with husband Legrand Como.  Is not currently working.     Her Allergies Are:  Allergies  Allergen Reactions  . Sulfa Antibiotics Hives  :   Her Current Medications Are:  Outpatient Encounter Medications as of 10/05/2019  Medication Sig  . busPIRone (BUSPAR) 5 MG tablet Take 1 tablet (5 mg total) by mouth 2 (two) times daily.  . citalopram (CELEXA) 20 MG tablet Take 1 tablet (20 mg total) by mouth daily.  Marland Kitchen gabapentin  (NEURONTIN) 300 MG capsule Take 1 capsule (300 mg total) by mouth 4 (four) times daily.  Marland Kitchen glipiZIDE (GLUCOTROL) 10 MG tablet Take 1 tablet (10 mg total) by mouth 2 (two) times daily before a meal.  . lisinopril (ZESTRIL) 5 MG tablet Take 1 tablet (5 mg total) by mouth daily.  . meclizine (ANTIVERT) 25 MG tablet Take 1 tablet (25 mg total) by mouth 3 (three) times daily as needed for  dizziness.  . metFORMIN (GLUCOPHAGE) 1000 MG tablet Take 1 tablet (1,000 mg total) by mouth 2 (two) times daily with a meal.  . nortriptyline (PAMELOR) 10 MG capsule Take 2 capsules (20 mg total) by mouth at bedtime.  . ONE TOUCH ULTRA TEST test strip CHECK BLOOD SUGAR 4 TIMES  DAILY  . simvastatin (ZOCOR) 20 MG tablet Take 1 tablet (20 mg total) by mouth daily at 6 PM. PHYSICAL DUE September   No facility-administered encounter medications on file as of 10/05/2019.   :   Review of Systems:  Out of a complete 14 point review of systems, all are reviewed and negative with the exception of these symptoms as listed below: Review of Systems  Neurological:       Rm 1.  Husband, Legrand Como.  States that has sx of bilateral hands, feet, calves of sharp pain, numbness/tingling, burning sensations.  Hand weakness. Balance off cause?  See's ENT next 1-2 wks.  Has tinnitus.  Has some dizziness (not vertigo) feels like will fall forward/backward.      Objective:  Neurological Exam  Physical Exam Physical Examination:   Vitals:   10/05/19 1112  BP: 127/83  Pulse: 77  Temp: (!) 97.4 F (36.3 C)   General Examination: The patient is a very pleasant 58 y.o. female in no acute distress. She appears well-developed and well-nourished and well groomed. Orthostatic testing was negative for any significant orthostatic blood pressure drop.  HEENT: Normocephalic, atraumatic, pupils are equal, round and reactive to light and accommodation. Dry eyes noted bilaterally, extraocular tracking is preserved, face is symmetric with  normal facial animation, hearing is grossly intact, tympanic membranes are clear bilaterally, no cerumen noted.  Speech shows spasmodic dysphonia, neck is supple, airway examination reveals moderate mouth dryness, tongue protrudes centrally in palate elevates symmetrically.  Chest: Clear to auscultation without wheezing, rhonchi or crackles noted.  Heart: S1+S2+0, regular and normal without murmurs, rubs or gallops noted.   Abdomen: Soft, non-tender and non-distended with normal bowel sounds appreciated on auscultation.  Extremities: There is no pitting edema in the distal lower extremities bilaterally. Pedal pulses are intact.  Skin: Warm and dry without trophic changes noted.  Musculoskeletal: exam reveals no obvious joint deformities, tenderness or joint swelling or erythema.   Neurologically:  Mental status: The patient is awake, alert and oriented in all 4 spheres. Her immediate and remote memory, attention, language skills and fund of knowledge are appropriate. There is no evidence of aphasia, agnosia, apraxia or anomia. Speech is clear with normal prosody and enunciation. Thought process is linear. Mood is normal and affect is normal.  Cranial nerves II - XII are as described above under HEENT exam. In addition: shoulder shrug is normal with equal shoulder height noted. Motor exam: Normal bulk, Global strength of 4+ out of 5 is noted, she has no drift, no obvious tremor, Romberg is negative with the exception of initial sway, reflexes are 1+ in the upper extremities and trace in the knees, absent in the ankles, toes are downgoing.  Fine motor skills are slightly slow but otherwise preserved in the upper and lower extremities. Cerebellar testing: No dysmetria or intention tremor. Sensory exam: Decreased to all modalities tested in the lower extremities up to upper calf areas and in the upper extremities up to the distal two thirds of the forearm. Gait, station and balance: She stands With  mild difficulty, she stands slightly wide-based.  She walks somewhat slowly and cautiously, no shuffling, preserved arm swing, is  able to do tandem walk slowly.   Assessment and Plan:   In summary, Kaytlin Baskins is a very pleasant 58 y.o.-year old female  with an underlying medical history of diabetes, neuropathy (for which she has been followed by Dr. Melrose Nakayama in neurology since last year), depression, Spasmodic dysphonia, and overweight state, who Presents for evaluation of her balance problem.  Orthostatic testing showed no obvious orthostatic hypotension or significant change in her pulse.  She has no vertiginous symptoms currently, examination is in keeping with neuropathy which has previously been diagnosed with her prior neurologist. Her balance problem is likely tied in with her underlying neuropathy, she so has been told in the past that she had B12 deficiency, we will recheck that.  She is advised to consider using a cane or a walker.  We talked about the importance of diabetes control which will be key.  We will check additional blood test today to look for any other treatable causes of neuropathy.  I do not believe repeating EMG and nerve conduction testing will add a whole lot or change management.  Unfortunately, also medication side effect may contribute to her balance problem including the gabapentin and Pamelor.  The Pamelor has also caused some mouth and eye dryness.  She is encouraged to talk to you about tapering off of it completely.  I would not favor increasing the gabapentin as she has had some sleepiness from it. She had a brain MRI which did not show any obvious cause for her balance problem.  She is scheduled to see ENT which is reasonable.  I did not see any abnormality on my examination today as far as her years ago but she does report tinnitus bilaterally.Unfortunately, there is not a whole lot I can offer her.  She is advised that we will call her with her blood test results soon,  I plan to see her back as needed.  I answered all their questions today and the patient and her husband were in agreement.  She is advised to stay well-hydrated, well rested and quit smoking.  Thank you very much for allowing me to participate in the care of this nice patient. If I can be of any further assistance to you please do not hesitate to call me at [phone].  Sincerely,   Star Age, MD, PhD

## 2019-10-06 ENCOUNTER — Telehealth: Payer: Self-pay | Admitting: *Deleted

## 2019-10-06 NOTE — Telephone Encounter (Signed)
I called and spoke to pt and I relayed that her lab results most are pending at this time, but her B12 did come back and showed level low.  Dr. Rexene Alberts recommended to f/u with pcp asap. She verbalized understanding.

## 2019-10-06 NOTE — Telephone Encounter (Signed)
-----   Message from Star Age, MD sent at 10/06/2019  7:45 AM EST ----- Most of the lab results are pending and we will update later. However, since we talked about her B12 def yesterday, I wanted to let her know, that her vit B12 is indeed low, which can affect nerves and balance. She should FU ASAP with her primary care.  Michel Bickers

## 2019-10-06 NOTE — Progress Notes (Signed)
Most of the lab results are pending and we will update later. However, since we talked about her B12 def yesterday, I wanted to let her know, that her vit B12 is indeed low, which can affect nerves and balance. She should FU ASAP with her primary care.  Michel Bickers

## 2019-10-09 LAB — MULTIPLE MYELOMA PANEL, SERUM
Albumin SerPl Elph-Mcnc: 4 g/dL (ref 2.9–4.4)
Albumin/Glob SerPl: 1.5 (ref 0.7–1.7)
Alpha 1: 0.2 g/dL (ref 0.0–0.4)
Alpha2 Glob SerPl Elph-Mcnc: 0.9 g/dL (ref 0.4–1.0)
B-Globulin SerPl Elph-Mcnc: 1.1 g/dL (ref 0.7–1.3)
Gamma Glob SerPl Elph-Mcnc: 0.6 g/dL (ref 0.4–1.8)
Globulin, Total: 2.8 g/dL (ref 2.2–3.9)
IgA/Immunoglobulin A, Serum: 269 mg/dL (ref 87–352)
IgG (Immunoglobin G), Serum: 733 mg/dL (ref 586–1602)
IgM (Immunoglobulin M), Srm: 66 mg/dL (ref 26–217)
Total Protein: 6.8 g/dL (ref 6.0–8.5)

## 2019-10-09 LAB — RHEUMATOID FACTOR: Rheumatoid fact SerPl-aCnc: <10 IU/mL (ref 0.0–13.9)

## 2019-10-09 LAB — HEAVY METALS PROFILE II, BLOOD
Arsenic: 8 ug/L (ref 2–23)
Cadmium: <0.5 ug/L (ref 0.0–1.2)
Lead, Blood: <1 ug/dL (ref 0–4)
Mercury: <1 ug/L (ref 0.0–14.9)

## 2019-10-09 LAB — B12 AND FOLATE PANEL
Folate: 9.3 ng/mL (ref >3.0)
Vitamin B-12: 162 pg/mL — ABNORMAL LOW (ref 232–1245)

## 2019-10-09 LAB — VITAMIN B6: Vitamin B6: 5.1 ug/L (ref 2.0–32.8)

## 2019-10-09 LAB — ANA W/REFLEX: Anti Nuclear Antibody (ANA): NEGATIVE

## 2019-10-09 LAB — VITAMIN B1: Thiamine: 154 nmol/L (ref 66.5–200.0)

## 2019-10-12 ENCOUNTER — Telehealth: Payer: Self-pay

## 2019-10-12 NOTE — Progress Notes (Signed)
I wanted to update patient after all her blood test results came back, everything else was in the normal range, other than the low B12 which we called her with, everything else was okay. Please call pt.

## 2019-10-12 NOTE — Telephone Encounter (Signed)
-----   Message from Star Age, MD sent at 10/12/2019  2:38 PM EST ----- I wanted to update patient after all her blood test results came back, everything else was in the normal range, other than the low B12 which we called her with, everything else was okay. Please call pt.

## 2019-10-12 NOTE — Telephone Encounter (Signed)
I called pt, left a detailed message on her cell phone number, per DPR, with these lab results and recommendations. I asked pt to call back with questions or concerns.

## 2019-10-17 ENCOUNTER — Other Ambulatory Visit: Payer: Self-pay | Admitting: Internal Medicine

## 2019-10-17 DIAGNOSIS — R202 Paresthesia of skin: Secondary | ICD-10-CM

## 2019-10-21 ENCOUNTER — Encounter: Payer: Self-pay | Admitting: Internal Medicine

## 2019-12-07 ENCOUNTER — Encounter: Payer: Self-pay | Admitting: Internal Medicine

## 2019-12-07 ENCOUNTER — Ambulatory Visit (INDEPENDENT_AMBULATORY_CARE_PROVIDER_SITE_OTHER): Payer: Self-pay | Admitting: Internal Medicine

## 2019-12-07 ENCOUNTER — Other Ambulatory Visit: Payer: Self-pay

## 2019-12-07 VITALS — BP 126/78 | HR 80 | Temp 97.3°F | Wt 172.0 lb

## 2019-12-07 DIAGNOSIS — R531 Weakness: Secondary | ICD-10-CM

## 2019-12-07 DIAGNOSIS — Z794 Long term (current) use of insulin: Secondary | ICD-10-CM

## 2019-12-07 DIAGNOSIS — R296 Repeated falls: Secondary | ICD-10-CM

## 2019-12-07 DIAGNOSIS — H9313 Tinnitus, bilateral: Secondary | ICD-10-CM

## 2019-12-07 DIAGNOSIS — R2689 Other abnormalities of gait and mobility: Secondary | ICD-10-CM

## 2019-12-07 DIAGNOSIS — E1165 Type 2 diabetes mellitus with hyperglycemia: Secondary | ICD-10-CM

## 2019-12-07 DIAGNOSIS — R42 Dizziness and giddiness: Secondary | ICD-10-CM

## 2019-12-07 LAB — POCT GLYCOSYLATED HEMOGLOBIN (HGB A1C): Hemoglobin A1C: 8.6 % — AB (ref 4.0–5.6)

## 2019-12-07 NOTE — Progress Notes (Signed)
Subjective:    Patient ID: Brittany Pruitt, female    DOB: 04/25/1961, 59 y.o.   MRN: [id number]  HPI  Pt presents to the clinic today for follow up dizziness, generalized weakness, and difficulty with balance and frequent falls.. This has been an ongoing issue for the last 18 months but worse in the last 6 months. She was seen 10/30 for the same. MRI brain from 09/15/19 was normal. She was started on Meclizine and referred to ENT for tinnitus and neurology for weakness, balance issues and dizziness. She had seen Dr. Melrose Nakayama in the past, but had [facility] care and needed to see a neurologist in the [facility]. She was Dr. Rexene Alberts 10/05/19. She had prio EMG testing 04/2018 which was negative for large fiber neuropathy. Dr. Rexene Alberts recommended improved diabetes control to help with neuropathy, weaning of sedative medications, such as Nortriptyline, Citalopram, Buspar and Gabepentin. She also recommended B12 supplementation.   She also reports ringing in her ears. She has and appt with ENT in April.  She would also like to follow up DM 2. Her last A1C was 10%, 08/2019. Her blood sugars range 180-220. She is taking Metformin and Glipizide as prescribed. She has been prescribed Januvia in the past but is not able to afford it. She is on Gabapentin for neuropathic pain. She checks her feet routinely. Flu 08/2019. Pneumovax 06/2019. Prevnar 05/2015.   Review of Systems  Past Medical History:  Diagnosis Date  . Chicken pox   . Depression   . Diabetes mellitus without complication ([place])   . Frequent headaches     Current Outpatient Medications  Medication Sig Dispense Refill  . busPIRone (BUSPAR) 5 MG tablet Take 1 tablet (5 mg total) by mouth 2 (two) times daily. 60 tablet 2  . citalopram (CELEXA) 20 MG tablet Take 1 tablet by mouth once daily 30 tablet 0  . gabapentin (NEURONTIN) 300 MG capsule Take 1 capsule by mouth 4 times daily 120 capsule 0  . glipiZIDE (GLUCOTROL) 10 MG tablet TAKE 1 TABLET  BY MOUTH TWICE DAILY BEFORE A MEAL 180 tablet 0  . lisinopril (ZESTRIL) 5 MG tablet Take 1 tablet (5 mg total) by mouth daily. 30 tablet 2  . meclizine (ANTIVERT) 25 MG tablet Take 1 tablet (25 mg total) by mouth 3 (three) times daily as needed for dizziness. 30 tablet 0  . metFORMIN (GLUCOPHAGE) 1000 MG tablet TAKE 1 TABLET BY MOUTH TWICE DAILY WITH MEALS 180 tablet 0  . nortriptyline (PAMELOR) 10 MG capsule TAKE 2 CAPSULES BY MOUTH AT BEDTIME 30 capsule 0  . ONE TOUCH ULTRA TEST test strip CHECK BLOOD SUGAR 4 TIMES  DAILY 200 each 11  . simvastatin (ZOCOR) 20 MG tablet Take 1 tablet (20 mg total) by mouth daily at 6 PM. PHYSICAL DUE September 30 tablet 1   No current facility-administered medications for this visit.    Allergies  Allergen Reactions  . Sulfa Antibiotics Hives    Family History  Problem Relation Age of Onset  . Uterine cancer Mother   . Breast cancer Mother 55  . Pancreatic cancer Mother   . Heart disease Father   . Hypertension Father   . Anxiety disorder Father   . Diabetes Father   . Uterine cancer Maternal Aunt   . Breast cancer Maternal Aunt 60  . Heart disease Paternal Grandfather     Social History   Socioeconomic History  . Marital status: Married    Spouse name: Not on  file  . Number of children: Not on file  . Years of education: Not on file  . Highest education level: Not on file  Occupational History  . Not on file  Tobacco Use  . Smoking status: Current Every Day Smoker    Packs/day: 0.75    Years: 30.00    Pack years: 22.50    Types: Cigarettes  . Smokeless tobacco: Never Used  Substance and Sexual Activity  . Alcohol use: No    Alcohol/week: 0.0 standard drinks  . Drug use: No  . Sexual activity: Never  Other Topics Concern  . Not on file  Social History Narrative   Pt lives with husband Legrand Como.  Is not currently working.    Social Determinants of Health   Financial Resource Strain:   . Difficulty of Paying Living Expenses:  Not on file  Food Insecurity:   . Worried About Charity fundraiser in the Last Year: Not on file  . Ran Out of Food in the Last Year: Not on file  Transportation Needs:   . Lack of Transportation (Medical): Not on file  . Lack of Transportation (Non-Medical): Not on file  Physical Activity:   . Days of Exercise per Week: Not on file  . Minutes of Exercise per Session: Not on file  Stress:   . Feeling of Stress : Not on file  Social Connections:   . Frequency of Communication with Friends and Family: Not on file  . Frequency of Social Gatherings with Friends and Family: Not on file  . Attends Religious Services: Not on file  . Active Member of Clubs or Organizations: Not on file  . Attends Archivist Meetings: Not on file  . Marital Status: Not on file  Intimate Partner Violence:   . Fear of Current or Ex-Partner: Not on file  . Emotionally Abused: Not on file  . Physically Abused: Not on file  . Sexually Abused: Not on file     Constitutional: Denies fever, malaise, fatigue, headache or abrupt weight changes.  HEENT: Pt reports ringing in her ears. Denies eye pain, eye redness, ear pain,  wax buildup, runny nose, nasal congestion, bloody nose, or sore throat. Respiratory: Denies difficulty breathing, shortness of breath, cough or sputum production.   Cardiovascular: Denies chest pain, chest tightness, palpitations or swelling in the hands or feet.  Musculoskeletal: Pt reports generalized weakness, muscle wasting in legs, difficulty with gait and frequent falls. Denies decrease in range of motion, muscle pain or joint pain and swelling.  Skin: Denies redness, rashes, lesions or ulcercations.  Neurological: Pt reports intermittent dizziness, neuropathic pain of BLE, difficulty with balance. Denies dizziness, difficulty with memory, difficulty with speech or problems with coordination.    No other specific complaints in a complete review of systems (except as listed in HPI  above).     Objective:   Physical Exam   BP 126/78   Pulse 80   Temp (!) 97.3 F (36.3 C) (Temporal)   Wt 172 lb (78 kg)   SpO2 98%   BMI 29.99 kg/m  Wt Readings from Last 3 Encounters:  12/07/19 172 lb (78 kg)  10/05/19 170 lb 3.2 oz (77.2 kg)  09/03/19 169 lb (76.7 kg)    General: Appears her stated age, well developed, well nourished in NAD. Skin: Warm, dry and intact. No ulcerations noted. HEENT: Head: normal shape and size; Ears: Tm's gray and intact, normal light reflex; Cardiovascular: Normal rate and rhythm. S1,S2 noted.  No murmur, rubs or gallops noted.  Pulmonary/Chest: Normal effort and positive vesicular breath sounds. No respiratory distress. No wheezes, rales or ronchi noted.  Musculoskeletal: Normal flexion and extension of the knees and ankles. No joint swelling noted. Gait slow and steady without device.  Neurological: Alert and oriented. Dysphonic speech noted. Sensation decreased to BLE. Positive Romberg.    BMET    Component Value Date/Time   NA 137 06/17/2019 1316   NA 141 03/16/2018 1537   NA 137 05/06/2012 0949   K 4.4 06/17/2019 1316   K 4.5 05/06/2012 0949   CL 101 06/17/2019 1316   CL 103 05/06/2012 0949   CO2 27 06/17/2019 1316   CO2 27 05/06/2012 0949   GLUCOSE 84 06/17/2019 1316   GLUCOSE 123 (H) 05/06/2012 0949   BUN 9 06/17/2019 1316   BUN 13 03/16/2018 1537   BUN 10 05/06/2012 0949   CREATININE 0.59 06/17/2019 1316   CREATININE 0.73 05/06/2012 0949   CALCIUM 10.1 06/17/2019 1316   CALCIUM 9.2 05/06/2012 0949   GFRNONAA 90 03/16/2018 1537   GFRNONAA >60 05/06/2012 0949   GFRAA 103 03/16/2018 1537   GFRAA >60 05/06/2012 0949    Lipid Panel     Component Value Date/Time   CHOL 114 06/17/2019 1316   CHOL 135 02/28/2017 1604   TRIG 125.0 06/17/2019 1316   HDL 40.50 06/17/2019 1316   HDL 38 (L) 02/28/2017 1604   CHOLHDL 3 06/17/2019 1316   VLDL 25.0 06/17/2019 1316   LDLCALC 48 06/17/2019 1316   LDLCALC 70 02/28/2017 1604      CBC    Component Value Date/Time   WBC 12.6 (H) 06/17/2019 1316   RBC 4.98 06/17/2019 1316   HGB 14.4 06/17/2019 1316   HGB 15.6 03/16/2018 1537   HCT 43.9 06/17/2019 1316   HCT 47.5 (H) 03/16/2018 1537   PLT 274.0 06/17/2019 1316   PLT 314 03/16/2018 1537   MCV 88.1 06/17/2019 1316   MCV 88 03/16/2018 1537   MCV 88 05/06/2012 0949   MCH 29.0 03/16/2018 1537   MCH 28.7 05/06/2012 0949   MCHC 32.9 06/17/2019 1316   RDW 14.5 06/17/2019 1316   RDW 14.1 03/16/2018 1537   RDW 13.7 05/06/2012 0949   LYMPHSABS 5.1 (H) 02/28/2017 1604   EOSABS 0.1 02/28/2017 1604   BASOSABS 0.1 02/28/2017 1604    Hgb A1C Lab Results  Component Value Date   HGBA1C 8.0 (A) 09/03/2019           Assessment & Plan:    DM 2:  Uncontrolled, has financial restraints POCT A1C today Encouraged low carb diet, exercise for weight loss Continue Metformin and Glipizide, difficulty avoiding other medications She does not want to see an endocrinologist at this time Foot exam today Encouraged yearly eye exam Flu and pneumonia vaccines UTD  Dizziness, Ringing in the Ears:  No neurological cause identified Encouraged her to follow up with ENT  Generalized Weakness, Difficulty with Balance, Frequent Falls:  She can not afford PT Will wean Nortriptyline first Continue Citalopram and Buspar for now Consider weaning Gabapentin next Consider oral B12, will not be able to afford biweekly/monthly injections  Return precautions discussed  Webb Silversmith, NP This visit occurred during the SARS-CoV-2 public health emergency.  Safety protocols were in place, including screening questions prior to the visit, additional usage of staff PPE, and extensive cleaning of exam room while observing appropriate contact time as indicated for disinfecting solutions.

## 2019-12-08 ENCOUNTER — Encounter: Payer: Self-pay | Admitting: Internal Medicine

## 2019-12-09 ENCOUNTER — Other Ambulatory Visit: Payer: Self-pay | Admitting: Internal Medicine

## 2019-12-09 DIAGNOSIS — F329 Major depressive disorder, single episode, unspecified: Secondary | ICD-10-CM

## 2019-12-09 DIAGNOSIS — F419 Anxiety disorder, unspecified: Secondary | ICD-10-CM

## 2019-12-09 DIAGNOSIS — R202 Paresthesia of skin: Secondary | ICD-10-CM

## 2019-12-10 ENCOUNTER — Encounter: Payer: Self-pay | Admitting: Internal Medicine

## 2019-12-10 NOTE — Patient Instructions (Signed)

## 2019-12-14 ENCOUNTER — Other Ambulatory Visit: Payer: Self-pay

## 2019-12-14 MED ORDER — SIMVASTATIN 20 MG PO TABS: 20.0000 mg | ORAL_TABLET | Freq: Every day | ORAL | 1 refills | Status: DC

## 2019-12-14 MED ORDER — LISINOPRIL 5 MG PO TABS: 5.0000 mg | ORAL_TABLET | Freq: Every day | ORAL | 5 refills | Status: DC

## 2019-12-14 NOTE — Addendum Note (Signed)
Addended by: Lurlean Nanny on: 12/14/2019 12:58 PM   Modules accepted: Orders

## 2019-12-24 ENCOUNTER — Telehealth: Payer: Self-pay

## 2019-12-24 NOTE — Telephone Encounter (Signed)
Completed my online portion of the application

## 2019-12-24 NOTE — Telephone Encounter (Signed)
Contacted patient by telephone to discuss patient assistance options for insulin. Patient would like to apply for Basaglar per PCP's recommendation with [employer] program. Explained application process and patient agrees to complete application online.

## 2020-01-12 ENCOUNTER — Encounter: Payer: Self-pay | Admitting: Internal Medicine

## 2020-02-09 ENCOUNTER — Encounter: Payer: Self-pay | Admitting: Internal Medicine

## 2020-02-15 ENCOUNTER — Other Ambulatory Visit: Payer: Self-pay | Admitting: Internal Medicine

## 2020-02-15 MED ORDER — BASAGLAR KWIKPEN 100 UNIT/ML ~~LOC~~ SOPN: 14.0000 [IU] | PEN_INJECTOR | Freq: Every day | SUBCUTANEOUS | 4 refills | Status: DC

## 2020-02-16 ENCOUNTER — Telehealth: Payer: Self-pay

## 2020-02-16 NOTE — Telephone Encounter (Signed)
Rx of [place] faxed to pt assistance

## 2020-02-18 ENCOUNTER — Ambulatory Visit: Payer: Self-pay | Attending: Oncology

## 2020-02-18 DIAGNOSIS — Z23 Encounter for immunization: Secondary | ICD-10-CM

## 2020-02-18 NOTE — Progress Notes (Signed)
   Covid-19 Vaccination Clinic  Name:  Brittany Pruitt    MRN: [id number] DOB: 10/17/1961  02/18/2020  Brittany Pruitt was observed post Covid-19 immunization for 15 minutes without incident. She was provided with Vaccine Information Sheet and instruction to access the V-Safe system.   Brittany Pruitt was instructed to call 911 with any severe reactions post vaccine: Marland Kitchen Difficulty breathing  . Swelling of face and throat  . A fast heartbeat  . A bad rash all over body  . Dizziness and weakness   Immunizations Administered    Name Date Dose VIS Date Route   Pfizer COVID-19 Vaccine 02/18/2020  8:16 AM 0.3 mL 10/15/2019 Intramuscular   Manufacturer: [place]   Lot: [id number]   [place]: [id number]

## 2020-03-06 ENCOUNTER — Other Ambulatory Visit: Payer: Self-pay | Admitting: Internal Medicine

## 2020-03-07 ENCOUNTER — Telehealth: Payer: Self-pay

## 2020-03-07 NOTE — Telephone Encounter (Signed)
Manufacturer assistance has been approved for:  Basaglar - Inject 14 units once daily at bedtime  Patient will receive medication at no cost effective February 29, 2020 - February 28, 2021. First medication was delivered to patient's home on 03/06/20. [facility] will contact patient directly for refills and she has been enrolled in auto-fill. Will need to reapply for assistance up to 60 days prior to February 29, 2020.   Debbora Dus, PharmD Clinical Pharmacist [facility] at [facility] [phone]

## 2020-03-07 NOTE — Telephone Encounter (Signed)
Noted, thank you for taking care of this.

## 2020-03-11 ENCOUNTER — Ambulatory Visit: Payer: Self-pay | Attending: Internal Medicine

## 2020-03-11 DIAGNOSIS — Z23 Encounter for immunization: Secondary | ICD-10-CM

## 2020-03-11 NOTE — Progress Notes (Signed)
   Covid-19 Vaccination Clinic  Name:  Brittany Pruitt    MRN: [id number] DOB: [birth year]  03/11/2020  Ms. Brittany Pruitt was observed post Covid-19 immunization for 15 minutes without incident. She was provided with Vaccine Information Sheet and instruction to access the V-Safe system.   Ms. Brittany Pruitt was instructed to call 911 with any severe reactions post vaccine: Marland Kitchen Difficulty breathing  . Swelling of face and throat  . A fast heartbeat  . A bad rash all over body  . Dizziness and weakness   Immunizations Administered    Name Date Dose VIS Date Route   Pfizer COVID-19 Vaccine 03/11/2020  8:46 AM 0.3 mL 12/29/2018 Intramuscular   Manufacturer: [place]   Lot: [id number]   [place]: [id number]

## 2020-07-11 ENCOUNTER — Other Ambulatory Visit: Payer: Self-pay | Admitting: Internal Medicine

## 2020-07-11 DIAGNOSIS — F419 Anxiety disorder, unspecified: Secondary | ICD-10-CM

## 2020-07-11 DIAGNOSIS — R202 Paresthesia of skin: Secondary | ICD-10-CM

## 2020-12-08 ENCOUNTER — Ambulatory Visit (INDEPENDENT_AMBULATORY_CARE_PROVIDER_SITE_OTHER): Payer: Medicare Other | Admitting: Internal Medicine

## 2020-12-08 ENCOUNTER — Encounter: Payer: Self-pay | Admitting: Internal Medicine

## 2020-12-08 ENCOUNTER — Other Ambulatory Visit: Payer: Self-pay

## 2020-12-08 VITALS — BP 122/78 | HR 71 | Temp 96.9°F | Wt 177.0 lb

## 2020-12-08 DIAGNOSIS — Z794 Long term (current) use of insulin: Secondary | ICD-10-CM

## 2020-12-08 DIAGNOSIS — Z114 Encounter for screening for human immunodeficiency virus [HIV]: Secondary | ICD-10-CM

## 2020-12-08 DIAGNOSIS — E7800 Pure hypercholesterolemia, unspecified: Secondary | ICD-10-CM

## 2020-12-08 DIAGNOSIS — J383 Other diseases of vocal cords: Secondary | ICD-10-CM

## 2020-12-08 DIAGNOSIS — Z5320 Procedure and treatment not carried out because of patient's decision for unspecified reasons: Secondary | ICD-10-CM | POA: Diagnosis not present

## 2020-12-08 DIAGNOSIS — F32A Depression, unspecified: Secondary | ICD-10-CM

## 2020-12-08 DIAGNOSIS — F419 Anxiety disorder, unspecified: Secondary | ICD-10-CM

## 2020-12-08 DIAGNOSIS — E1165 Type 2 diabetes mellitus with hyperglycemia: Secondary | ICD-10-CM | POA: Diagnosis not present

## 2020-12-08 DIAGNOSIS — R519 Headache, unspecified: Secondary | ICD-10-CM

## 2020-12-08 DIAGNOSIS — Z23 Encounter for immunization: Secondary | ICD-10-CM | POA: Diagnosis not present

## 2020-12-08 DIAGNOSIS — I10 Essential (primary) hypertension: Secondary | ICD-10-CM

## 2020-12-08 DIAGNOSIS — H8103 Meniere's disease, bilateral: Secondary | ICD-10-CM

## 2020-12-08 LAB — CBC
HCT: 43.8 % (ref 36.0–46.0)
Hemoglobin: 14.5 g/dL (ref 12.0–15.0)
MCHC: 33.1 g/dL (ref 30.0–36.0)
MCV: 84.9 fl (ref 78.0–100.0)
Platelets: 268 10*3/uL (ref 150.0–400.0)
RBC: 5.15 Mil/uL — ABNORMAL HIGH (ref 3.87–5.11)
RDW: 14.5 % (ref 11.5–15.5)
WBC: 9.6 10*3/uL (ref 4.0–10.5)

## 2020-12-08 LAB — COMPREHENSIVE METABOLIC PANEL
ALT: 12 U/L (ref 0–35)
AST: 12 U/L (ref 0–37)
Albumin: 4.3 g/dL (ref 3.5–5.2)
Alkaline Phosphatase: 49 U/L (ref 39–117)
BUN: 11 mg/dL (ref 6–23)
CO2: 29 mEq/L (ref 19–32)
Calcium: 9.7 mg/dL (ref 8.4–10.5)
Chloride: 101 mEq/L (ref 96–112)
Creatinine, Ser: 0.63 mg/dL (ref 0.40–1.20)
GFR: 95.92 mL/min (ref >60.00)
Glucose, Bld: 104 mg/dL — ABNORMAL HIGH (ref 70–99)
Potassium: 4.5 mEq/L (ref 3.5–5.1)
Sodium: 136 mEq/L (ref 135–145)
Total Bilirubin: 0.5 mg/dL (ref 0.2–1.2)
Total Protein: 7.3 g/dL (ref 6.0–8.3)

## 2020-12-08 LAB — HEMOGLOBIN A1C: Hgb A1c MFr Bld: 8.5 % — ABNORMAL HIGH (ref 4.6–6.5)

## 2020-12-08 LAB — LIPID PANEL
Cholesterol: 137 mg/dL (ref 0–200)
HDL: 43.9 mg/dL (ref >39.00)
LDL Cholesterol: 59 mg/dL (ref 0–99)
NonHDL: 93.29
Total CHOL/HDL Ratio: 3
Triglycerides: 170 mg/dL — ABNORMAL HIGH (ref 0.0–149.0)
VLDL: 34 mg/dL (ref 0.0–40.0)

## 2020-12-08 NOTE — Progress Notes (Signed)
Subjective:    Patient ID: Brittany Pruitt, female    DOB: [birth year], 60 y.o.   MRN: [id number]  HPI  Patient presents the clinic today for follow-up of chronic conditions.    Anxiety and Depression: Persistent and slightly worse.  She is prescribed Citalopram and BuSpar but has not been taking this daily due to financial constraints.  She would like to go back on Clonazepam as well which not only helped her anxiety but her spasmodic dysphonia.  She is not currently seeing a therapist.  She denies SI/HI.  DM2: Her last A1c was 8.6, 12/2019.  She reports her sugars have been elevated because her Basaglar was sent in at 14 units and she used to take 40 units.  She is taking Glipizide, Metformin and gabapentin as prescribed.  She checks her feet routinely.  Her last eye exam was within the last year.  She would like to get her flu shot today.  She has had Sport and exercise psychologist.  Pneumovax UTD.  HTN: Her BP today is 122/78.  She is taking Lisinopril as prescribed.  There is no ECG on file.  HLD: Her last LDL was 48, 06/2019.  She denies myalgias on Simvastatin.  She tries to consume a low-fat diet.  Frequent Headaches: Triggered by stress.  Managed with Nortriptyline as prescribed with good relief of symptoms.  She does not follow with neurology.  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  She is not currently taking any medications for this.   Review of Systems      Past Medical History:  Diagnosis Date  . Chicken pox   . Depression   . Diabetes mellitus without complication ([place])   . Frequent headaches     Current Outpatient Medications  Medication Sig Dispense Refill  . busPIRone (BUSPAR) 5 MG tablet Take 1 tablet by mouth twice daily 60 tablet 0  . citalopram (CELEXA) 20 MG tablet Take 1 tablet by mouth once daily 30 tablet 0  . gabapentin (NEURONTIN) 300 MG capsule Take 1 capsule by mouth 4 times daily 120  capsule 0  . glipiZIDE (GLUCOTROL) 10 MG tablet TAKE 1 TABLET BY MOUTH TWICE DAILY BEFORE A MEAL 180 tablet 0  . Insulin Glargine (BASAGLAR KWIKPEN) 100 UNIT/ML Inject 0.14 mLs (14 Units total) into the skin daily. 15 mL 4  . lisinopril (ZESTRIL) 5 MG tablet Take 1 tablet by mouth once daily 90 tablet 0  . meclizine (ANTIVERT) 25 MG tablet TAKE 1 TABLET BY MOUTH THREE TIMES DAILY AS NEEDED FOR  DIZZINESS 30 tablet 5  . metFORMIN (GLUCOPHAGE) 1000 MG tablet TAKE 1 TABLET BY MOUTH TWICE DAILY WITH MEALS 180 tablet 0  . nortriptyline (PAMELOR) 10 MG capsule TAKE 2 CAPSULES BY MOUTH AT BEDTIME 30 capsule 0  . ONE TOUCH ULTRA TEST test strip CHECK BLOOD SUGAR 4 TIMES  DAILY 200 each 11  . simvastatin (ZOCOR) 20 MG tablet TAKE 1 TABLET BY MOUTH ONCE DAILY AT  6PM  (MUST  SCHEDULE  PHYSICAL  EXAM  FOR  SEPTEMBER) 60 tablet 0   No current facility-administered medications for this visit.    Allergies  Allergen Reactions  . Sulfa Antibiotics Hives    Family History  Problem Relation Age of Onset  . Uterine cancer Mother   . Breast cancer Mother 10  . Pancreatic cancer Mother   . Heart disease Father   . Hypertension Father   .  Anxiety disorder Father   . Diabetes Father   . Uterine cancer Maternal Aunt   . Breast cancer Maternal Aunt 60  . Heart disease Paternal Grandfather     Social History   Socioeconomic History  . Marital status: Married    Spouse name: Not on file  . Number of children: Not on file  . Years of education: Not on file  . Highest education level: Not on file  Occupational History  . Not on file  Tobacco Use  . Smoking status: Current Every Day Smoker    Packs/day: 0.75    Years: 30.00    Pack years: 22.50    Types: Cigarettes  . Smokeless tobacco: Never Used  Substance and Sexual Activity  . Alcohol use: No    Alcohol/week: 0.0 standard drinks  . Drug use: No  . Sexual activity: Never  Other Topics Concern  . Not on file  Social History Narrative    Pt lives with husband [name].  Is not currently working.    Social Determinants of Health   Financial Resource Strain: Not on file  Food Insecurity: Not on file  Transportation Needs: Not on file  Physical Activity: Not on file  Stress: Not on file  Social Connections: Not on file  Intimate Partner Violence: Not on file     Constitutional: Patient reports intermittent headaches.  D fever, malaise, fatigue,or abrupt weight changes.  HEENT: Patient reports spasmodic dysphonia.  Denies eye pain, eye redness, ear pain, ringing in the ears, wax buildup, runny nose, nasal congestion, bloody nose, or sore throat. Respiratory: Denies difficulty breathing, shortness of breath, cough or sputum production.   Cardiovascular: Denies chest pain, chest tightness, palpitations or swelling in the hands or feet.  Gastrointestinal: Denies abdominal pain, bloating, constipation, diarrhea or blood in the stool.  GU: Denies urgency, frequency, pain with urination, burning sensation, blood in urine, odor or discharge. Musculoskeletal: Denies decrease in range of motion, difficulty with gait, muscle pain or joint pain and swelling.  Skin: Denies redness, rashes, lesions or ulcercations.  Neurological: Patient reports intermittent dizziness.  Denies difficulty with memory, difficulty with speech or problems with balance and coordination.  Psych: Patient has a history of anxiety and depression.  Denies SI/HI.  No other specific complaints in a complete review of systems (except as listed in HPI above).  Objective:   Physical Exam  BP 122/78   Pulse 71   Temp (!) 96.9 F (36.1 C) (Temporal)   Wt 177 lb (80.3 kg)   SpO2 97%   BMI 30.86 kg/m   Wt Readings from Last 3 Encounters:  12/07/19 172 lb (78 kg)  10/05/19 170 lb 3.2 oz (77.2 kg)  09/03/19 169 lb (76.7 kg)    General: Appears her stated age, chronically ill-appearing, but in NAD. Skin: Warm, dry and intact. No ulcerations noted. HEENT:  Head: normal shape and size; Eyes: sclera white, no icterus, conjunctiva pink, PERRLA and EOMs intact; Cardiovascular: Normal rate and rhythm. S1,S2 noted.  No murmur, rubs or gallops noted. No JVD or BLE edema. No carotid bruits noted. Pulmonary/Chest: Normal effort and positive vesicular breath sounds. No respiratory distress. No wheezes, rales or ronchi noted.  Musculoskeletal: Gait slow and steady without device  Neurological: Alert and oriented.  Psychiatric: Mood and affect normal. Behavior is normal. Judgment and thought content normal.    BMET    Component Value Date/Time   NA 137 06/17/2019 1316   NA 141 03/16/2018 1537   NA  137 05/06/2012 0949   K 4.4 06/17/2019 1316   K 4.5 05/06/2012 0949   CL 101 06/17/2019 1316   CL 103 05/06/2012 0949   CO2 27 06/17/2019 1316   CO2 27 05/06/2012 0949   GLUCOSE 84 06/17/2019 1316   GLUCOSE 123 (H) 05/06/2012 0949   BUN 9 06/17/2019 1316   BUN 13 03/16/2018 1537   BUN 10 05/06/2012 0949   CREATININE 0.59 06/17/2019 1316   CREATININE 0.73 05/06/2012 0949   CALCIUM 10.1 06/17/2019 1316   CALCIUM 9.2 05/06/2012 0949   GFRNONAA 90 03/16/2018 1537   GFRNONAA >60 05/06/2012 0949   GFRAA 103 03/16/2018 1537   GFRAA >60 05/06/2012 0949    Lipid Panel     Component Value Date/Time   CHOL 114 06/17/2019 1316   CHOL 135 02/28/2017 1604   TRIG 125.0 06/17/2019 1316   HDL 40.50 06/17/2019 1316   HDL 38 (L) 02/28/2017 1604   CHOLHDL 3 06/17/2019 1316   VLDL 25.0 06/17/2019 1316   LDLCALC 48 06/17/2019 1316   LDLCALC 70 02/28/2017 1604    CBC    Component Value Date/Time   WBC 12.6 (H) 06/17/2019 1316   RBC 4.98 06/17/2019 1316   HGB 14.4 06/17/2019 1316   HGB 15.6 03/16/2018 1537   HCT 43.9 06/17/2019 1316   HCT 47.5 (H) 03/16/2018 1537   PLT 274.0 06/17/2019 1316   PLT 314 03/16/2018 1537   MCV 88.1 06/17/2019 1316   MCV 88 03/16/2018 1537   MCV 88 05/06/2012 0949   MCH 29.0 03/16/2018 1537   MCH 28.7 05/06/2012 0949    MCHC 32.9 06/17/2019 1316   RDW 14.5 06/17/2019 1316   RDW 14.1 03/16/2018 1537   RDW 13.7 05/06/2012 0949   LYMPHSABS 5.1 (H) 02/28/2017 1604   EOSABS 0.1 02/28/2017 1604   BASOSABS 0.1 02/28/2017 1604    Hgb A1C Lab Results  Component Value Date   HGBA1C 8.6 (A) 12/07/2019            Assessment & Plan:    Webb Silversmith, NP This visit occurred during the SARS-CoV-2 public health emergency.  Safety protocols were in place, including screening questions prior to the visit, additional usage of staff PPE, and extensive cleaning of exam room while observing appropriate contact time as indicated for disinfecting solutions.

## 2020-12-11 ENCOUNTER — Encounter: Payer: Self-pay | Admitting: Internal Medicine

## 2020-12-11 DIAGNOSIS — R202 Paresthesia of skin: Secondary | ICD-10-CM

## 2020-12-11 DIAGNOSIS — F32A Depression, unspecified: Secondary | ICD-10-CM

## 2020-12-11 DIAGNOSIS — H8103 Meniere's disease, bilateral: Secondary | ICD-10-CM | POA: Insufficient documentation

## 2020-12-11 LAB — HEPATITIS C ANTIBODY
Hepatitis C Ab: NONREACTIVE
SIGNAL TO CUT-OFF: 0.01 (ref <1.00)

## 2020-12-11 LAB — HIV ANTIBODY (ROUTINE TESTING W REFLEX): HIV 1&2 Ab, 4th Generation: NONREACTIVE

## 2020-12-11 NOTE — Assessment & Plan Note (Signed)
C-Met and lipid profile today Encouraged her to consume a low-fat diet Continue simvastatin for now

## 2020-12-11 NOTE — Assessment & Plan Note (Signed)
Follows with [facility] audiology

## 2020-12-11 NOTE — Patient Instructions (Signed)
Diabetes Mellitus and Exercise Exercising regularly is important for overall health, especially for people who have diabetes mellitus. Exercising is not only about losing weight. It has many other health benefits, such as increasing muscle strength and bone density and reducing body fat and stress. This leads to improved fitness, flexibility, and endurance, all of which result in better overall health. What are the benefits of exercise if I have diabetes? Exercise has many benefits for people with diabetes. They include:  Helping to lower and control blood sugar (glucose).  Helping the body to respond better to the hormone insulin by improving insulin sensitivity.  Reducing how much insulin the body needs.  Lowering the risk for heart disease by: ? Lowering "bad" cholesterol and triglyceride levels. ? Increasing "good" cholesterol levels. ? Lowering blood pressure. ? Lowering blood glucose levels. What is my activity plan? Your health care provider or certified diabetes educator can help you make a plan for the type and frequency of exercise that works for you. This is called your activity plan. Be sure to:  Get at least 150 minutes of medium-intensity or high-intensity exercise each week. Exercises may include brisk walking, biking, or water aerobics.  Do stretching and strengthening exercises, such as yoga or weight lifting, at least 2 times a week.  Spread out your activity over at least 3 days of the week.  Get some form of physical activity each day. ? Do not go more than 2 days in a row without some kind of physical activity. ? Avoid being inactive for more than 90 minutes at a time. Take frequent breaks to walk or stretch.  Choose exercises or activities that you enjoy. Set realistic goals.  Start slowly and gradually increase your exercise intensity over time.   How do I manage my diabetes during exercise? Monitor your blood glucose  Check your blood glucose before and  after exercising. If your blood glucose is: ? 240 mg/dL (13.3 mmol/L) or higher before you exercise, check your urine for ketones. These are chemicals created by the liver. If you have ketones in your urine, do not exercise until your blood glucose returns to normal. ? 100 mg/dL (5.6 mmol/L) or lower, eat a snack containing 15-20 grams of carbohydrate. Check your blood glucose 15 minutes after the snack to make sure that your glucose level is above 100 mg/dL (5.6 mmol/L) before you start your exercise.  Know the symptoms of low blood glucose (hypoglycemia) and how to treat it. Your risk for hypoglycemia increases during and after exercise. Follow these tips and your health care provider's instructions  Keep a carbohydrate snack that is fast-acting for use before, during, and after exercise to help prevent or treat hypoglycemia.  Avoid injecting insulin into areas of the body that are going to be exercised. For example, avoid injecting insulin into: ? Your arms, when you are about to play tennis. ? Your legs, when you are about to go jogging.  Keep records of your exercise habits. Doing this can help you and your health care provider adjust your diabetes management plan as needed. Write down: ? Food that you eat before and after you exercise. ? Blood glucose levels before and after you exercise. ? The type and amount of exercise you have done.  Work with your health care provider when you start a new exercise or activity. He or she may need to: ? Make sure that the activity is safe for you. ? Adjust your insulin, other medicines, and food that   you eat.  Drink plenty of water while you exercise. This prevents loss of water (dehydration) and problems caused by a lot of heat in the body (heat stroke).   Where to find more information  American Diabetes Association: www.diabetes.org Summary  Exercising regularly is important for overall health, especially for people who have diabetes  mellitus.  Exercising has many health benefits. It increases muscle strength and bone density and reduces body fat and stress. It also lowers and controls blood glucose.  Your health care provider or certified diabetes educator can help you make an activity plan for the type and frequency of exercise that works for you.  Work with your health care provider to make sure any new activity is safe for you. Also work with your health care provider to adjust your insulin, other medicines, and the food you eat. This information is not intended to replace advice given to you by your health care provider. Make sure you discuss any questions you have with your health care provider. Document Revised: 07/19/2019 Document Reviewed: 07/19/2019 Elsevier Patient Education  2021 Elsevier Inc.  

## 2020-12-11 NOTE — Assessment & Plan Note (Addendum)
Clonazepam refilled today-sedation and addiction caution given

## 2020-12-11 NOTE — Assessment & Plan Note (Signed)
Deteriorated Refilled Citalopram and BuSpar Rx for Clonazepam sent to pharmacy, sedation and addiction caution given Support offered, will monitor

## 2020-12-11 NOTE — Assessment & Plan Note (Signed)
A1c today No urine microalbumin secondary to ACEI therapy Encouraged her to consume a low carb diet and exercise for weight loss Continue Metformin, Glipizide, Gabapentin We will switch Basaglar to Lantus Encourage routine eye exams Encourage routine foot exams Encouraged her to get her COVID booster Flu shot today Pneumovax UTD

## 2020-12-11 NOTE — Assessment & Plan Note (Signed)
Continue Nortriptyline We will monitor

## 2020-12-13 ENCOUNTER — Other Ambulatory Visit: Payer: Self-pay

## 2020-12-13 ENCOUNTER — Ambulatory Visit: Payer: Medicare Other | Admitting: Podiatry

## 2020-12-13 ENCOUNTER — Encounter: Payer: Self-pay | Admitting: Podiatry

## 2020-12-13 DIAGNOSIS — R52 Pain, unspecified: Secondary | ICD-10-CM | POA: Diagnosis not present

## 2020-12-13 DIAGNOSIS — Z794 Long term (current) use of insulin: Secondary | ICD-10-CM | POA: Diagnosis not present

## 2020-12-13 DIAGNOSIS — E1165 Type 2 diabetes mellitus with hyperglycemia: Secondary | ICD-10-CM

## 2020-12-13 DIAGNOSIS — Q828 Other specified congenital malformations of skin: Secondary | ICD-10-CM

## 2020-12-13 NOTE — Progress Notes (Signed)
  Subjective:  Patient ID: Brittany Pruitt, female    DOB: 10-01-1961,  MRN: [id number]  Chief Complaint  Patient presents with  . Callouses    Patient presents today for painful callous lesion bottom of left forefoot x years.  She said "it never went away from the last visit here.  It feels like a rock and it really hurts to walk or touch"    60 y.o. female presents with the above complaint. History confirmed with patient.  She previously saw Dr. Amalia Hailey for the same issue, he debrided and applied an ointment which helped.  It has come back.  She currently does not put anything on it.  Objective:  Physical Exam: warm, good capillary refill, no trophic changes or ulcerative lesions, normal DP and PT pulses and normal sensory exam. Left Foot: Porokeratosis submetatarsal 2  Assessment:  No diagnosis found.   Plan:  Patient was evaluated and treated and all questions answered.  All symptomatic hyperkeratoses were safely debrided with a sterile #15 blade to patient's level of comfort without incident. We discussed preventative and palliative care of these lesions including supportive and accommodative shoegear, padding, prefabricated and custom molded accommodative orthoses, use of a pumice stone and lotions/creams daily.  Salinocaine ointment after debriding the lesion and enucleated the core.  Recommend she apply urea cream for this.  Also discussed use of aperture pads which were dispensed today, she will look for these online.  Return if symptoms worsen or fail to improve.

## 2020-12-13 NOTE — Patient Instructions (Addendum)
Look for urea 40% cream or ointment and apply to the thickened dry skin / calluses. This can be bought over the counter, at a pharmacy or online such as [employer].   Aperture pads can be found on [place] or a foot care store / aisle. Foam or felt pads have a small hole that you can remove and put around the lesion

## 2020-12-14 ENCOUNTER — Ambulatory Visit: Payer: Medicare Other | Admitting: Podiatry

## 2020-12-14 MED ORDER — GLIPIZIDE 10 MG PO TABS: ORAL_TABLET | ORAL | 1 refills | Status: DC

## 2020-12-14 MED ORDER — CITALOPRAM HYDROBROMIDE 20 MG PO TABS: 20.0000 mg | ORAL_TABLET | Freq: Every day | ORAL | 1 refills | Status: DC

## 2020-12-14 MED ORDER — BUSPIRONE HCL 5 MG PO TABS: 5.0000 mg | ORAL_TABLET | Freq: Two times a day (BID) | ORAL | 1 refills | Status: DC

## 2020-12-14 MED ORDER — LANTUS SOLOSTAR 100 UNIT/ML ~~LOC~~ SOPN: 30.0000 [IU] | PEN_INJECTOR | Freq: Every day | SUBCUTANEOUS | 1 refills | Status: DC

## 2020-12-14 MED ORDER — LISINOPRIL 5 MG PO TABS: 5.0000 mg | ORAL_TABLET | Freq: Every day | ORAL | 1 refills | Status: DC

## 2020-12-14 MED ORDER — SIMVASTATIN 20 MG PO TABS: ORAL_TABLET | ORAL | 1 refills | Status: DC

## 2020-12-14 MED ORDER — GABAPENTIN 300 MG PO CAPS: 300.0000 mg | ORAL_CAPSULE | Freq: Four times a day (QID) | ORAL | 1 refills | Status: DC

## 2020-12-14 MED ORDER — METFORMIN HCL 1000 MG PO TABS: 1000.0000 mg | ORAL_TABLET | Freq: Two times a day (BID) | ORAL | 1 refills | Status: DC

## 2020-12-14 NOTE — Addendum Note (Signed)
Addended by: Lurlean Nanny on: 12/14/2020 04:47 PM   Modules accepted: Orders

## 2020-12-15 ENCOUNTER — Other Ambulatory Visit: Payer: Self-pay

## 2020-12-15 MED ORDER — CLONAZEPAM 0.5 MG PO TABS
0.2500 mg | ORAL_TABLET | Freq: Two times a day (BID) | ORAL | 0 refills | Status: DC | PRN
Start: 2020-12-15 — End: 2021-06-17

## 2020-12-15 NOTE — Telephone Encounter (Signed)
Last filled 04/2019... #45 please advise

## 2020-12-15 NOTE — Telephone Encounter (Cosign Needed)
Yes, are you able to send it to me as a refill request?

## 2020-12-20 ENCOUNTER — Other Ambulatory Visit: Payer: Self-pay | Admitting: Internal Medicine

## 2020-12-20 DIAGNOSIS — F419 Anxiety disorder, unspecified: Secondary | ICD-10-CM

## 2020-12-20 DIAGNOSIS — F32A Depression, unspecified: Secondary | ICD-10-CM

## 2021-01-04 ENCOUNTER — Telehealth: Payer: Self-pay

## 2021-01-04 NOTE — Chronic Care Management (AMB) (Signed)
  Chronic Care Management   Outreach Note  01/04/2021 Name: Kurstyn Larios MRN: [id number] DOB: 05/30/1961  Tattianna Schnarr is a 60 y.o. year old female who is a primary care patient of [name], NP. I reached out to Waymon Budge by phone today in response to a referral sent by Ms. Antoine Poche PCP, [name], NP     An unsuccessful telephone outreach was attempted today. The patient was referred to the case management team for assistance with care management and care coordination.   Follow Up Plan: A HIPAA compliant phone message was left for the patient providing contact information and requesting a return call.  The care management team will reach out to the patient again over the next 5 days.  If patient returns call to provider office, please advise to call [facility] at [place], [place], [place], [place] [address] Direct Dial: [phone] [email or web address] Website: [email or web address]

## 2021-01-17 NOTE — Chronic Care Management (AMB) (Signed)
  Chronic Care Management   Note  01/17/2021 Name: Brittany Pruitt MRN: [id number] DOB: May 17, 1961  [name] is a 60 y.o. year old female who is a primary care patient of [name], NP. I reached out to Waymon Budge by phone today in response to a referral sent by [name]'s PCP, [name], NP     [name] was given information about Chronic Care Management services today including:  1. CCM service includes personalized support from designated clinical staff supervised by her physician, including individualized plan of care and coordination with other care providers 2. 24/7 contact phone numbers for assistance for urgent and routine care needs. 3. Service will only be billed when office clinical staff spend 20 minutes or more in a month to coordinate care. 4. Only one practitioner may furnish and bill the service in a calendar month. 5. The patient may stop CCM services at any time (effective at the end of the month) by phone call to the office staff. 6. The patient will be responsible for cost sharing (co-pay) of up to 20% of the service fee (after annual deductible is met).  Patient did not agree to enrollment in care management services and does not wish to consider at this time.  Follow up plan: Patient declines  engagement by the care management team. Appropriate care team members and provider have been notified via electronic communication.   Noreene Larsson, [place], [place], [place] [address] Direct Dial: [phone] [email or web address] Website: [email or web address]

## 2021-06-15 ENCOUNTER — Encounter: Payer: Self-pay | Admitting: Internal Medicine

## 2021-06-17 MED ORDER — CLONAZEPAM 0.5 MG PO TABS: 0.2500 mg | ORAL_TABLET | Freq: Two times a day (BID) | ORAL | 0 refills | Status: DC | PRN

## 2021-08-21 ENCOUNTER — Encounter: Payer: Self-pay | Admitting: Internal Medicine

## 2021-08-21 ENCOUNTER — Ambulatory Visit (INDEPENDENT_AMBULATORY_CARE_PROVIDER_SITE_OTHER): Payer: Medicare Other | Admitting: Internal Medicine

## 2021-08-21 ENCOUNTER — Other Ambulatory Visit: Payer: Self-pay

## 2021-08-21 VITALS — BP 102/56 | HR 69 | Temp 97.8°F | Resp 17 | Ht 63.5 in | Wt 172.8 lb

## 2021-08-21 DIAGNOSIS — Z6831 Body mass index (BMI) 31.0-31.9, adult: Secondary | ICD-10-CM | POA: Insufficient documentation

## 2021-08-21 DIAGNOSIS — F32A Depression, unspecified: Secondary | ICD-10-CM

## 2021-08-21 DIAGNOSIS — F419 Anxiety disorder, unspecified: Secondary | ICD-10-CM | POA: Diagnosis not present

## 2021-08-21 DIAGNOSIS — J383 Other diseases of vocal cords: Secondary | ICD-10-CM

## 2021-08-21 DIAGNOSIS — E1165 Type 2 diabetes mellitus with hyperglycemia: Secondary | ICD-10-CM

## 2021-08-21 DIAGNOSIS — H8103 Meniere's disease, bilateral: Secondary | ICD-10-CM

## 2021-08-21 DIAGNOSIS — Z794 Long term (current) use of insulin: Secondary | ICD-10-CM

## 2021-08-21 DIAGNOSIS — E663 Overweight: Secondary | ICD-10-CM | POA: Insufficient documentation

## 2021-08-21 DIAGNOSIS — E6609 Other obesity due to excess calories: Secondary | ICD-10-CM | POA: Insufficient documentation

## 2021-08-21 DIAGNOSIS — Z23 Encounter for immunization: Secondary | ICD-10-CM

## 2021-08-21 DIAGNOSIS — Z780 Asymptomatic menopausal state: Secondary | ICD-10-CM | POA: Diagnosis not present

## 2021-08-21 DIAGNOSIS — G63 Polyneuropathy in diseases classified elsewhere: Secondary | ICD-10-CM

## 2021-08-21 DIAGNOSIS — Z0000 Encounter for general adult medical examination without abnormal findings: Secondary | ICD-10-CM | POA: Diagnosis not present

## 2021-08-21 DIAGNOSIS — Z6829 Body mass index (BMI) 29.0-29.9, adult: Secondary | ICD-10-CM | POA: Insufficient documentation

## 2021-08-21 DIAGNOSIS — Z1231 Encounter for screening mammogram for malignant neoplasm of breast: Secondary | ICD-10-CM

## 2021-08-21 DIAGNOSIS — I10 Essential (primary) hypertension: Secondary | ICD-10-CM

## 2021-08-21 DIAGNOSIS — E66811 Obesity, class 1: Secondary | ICD-10-CM

## 2021-08-21 DIAGNOSIS — E349 Endocrine disorder, unspecified: Secondary | ICD-10-CM

## 2021-08-21 NOTE — Assessment & Plan Note (Signed)
Controlled on Lisinopril ?Reinforced DASH diet and exercise for weight loss ?C-Met today ?

## 2021-08-21 NOTE — Progress Notes (Addendum)
HPI:  Patient presents the clinic today for her initial  Medicare wellness exam.  She is also due to follow-up chronic conditions.  Anxiety and Depression: Chronic, worse after the passing of her father and uncle. Managed on Citalopram, BuSpar and Clonazepam.  She is not currently seeing a therapist.  She denies SI/HI.  DM2: Her last A1c was 8.5%, 12/2020.  She is taking Glipizide, Metformin, Lantus and Gabapentin as prescribed.  Her sugars range 120-230.  She checks her feet routinely.  Her last eye exam was more than 1 year.  HTN: Her BP today is 102/56.  She is taking Lisinopril as prescribed.  There is no ECG on file.  HLD: Her last LDL was 59, triglycerides 170, 12/2020.  She denies myalgias on Simvastatin.  She tries to consume a low-fat diet.  Frequent Headaches: Triggered by stress.  She is no longer taking Nortriptyline. She takes Tylenol as needed with good relief. She does not follow with neurology.  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  She has seen [facility] audiology in the past.  Past Medical History:  Diagnosis Date   Chicken pox    Depression    Diabetes mellitus without complication (HCC)    Frequent headaches     Current Outpatient Medications  Medication Sig Dispense Refill   busPIRone (BUSPAR) 5 MG tablet Take 1 tablet (5 mg total) by mouth 2 (two) times daily. 180 tablet 1   citalopram (CELEXA) 20 MG tablet Take 1 tablet (20 mg total) by mouth daily. 90 tablet 1   clonazePAM (KLONOPIN) 0.5 MG tablet Take 0.5-1 tablets (0.25-0.5 mg total) by mouth 2 (two) times daily as needed for anxiety. 45 tablet 0   gabapentin (NEURONTIN) 300 MG capsule Take 1 capsule (300 mg total) by mouth 4 (four) times daily. 360 capsule 1   glipiZIDE (GLUCOTROL) 10 MG tablet TAKE 1 TABLET BY MOUTH TWICE DAILY BEFORE A MEAL 180 tablet 1   insulin glargine (LANTUS SOLOSTAR) 100 UNIT/ML Solostar Pen Inject  30 Units into the skin daily. 30 mL 1   insulin lispro (HUMALOG) 100 UNIT/ML KwikPen Inject 8 units with breakfast, 10 units with lunch, 12 units with supper.     lisinopril (ZESTRIL) 5 MG tablet Take 1 tablet (5 mg total) by mouth daily. 90 tablet 1   meclizine (ANTIVERT) 25 MG tablet TAKE 1 TABLET BY MOUTH THREE TIMES DAILY AS NEEDED FOR  DIZZINESS 30 tablet 5   metFORMIN (GLUCOPHAGE) 1000 MG tablet Take 1 tablet (1,000 mg total) by mouth 2 (two) times daily with a meal. 180 tablet 1   nortriptyline (PAMELOR) 10 MG capsule TAKE 2 CAPSULES BY MOUTH AT BEDTIME 30 capsule 0   ONE TOUCH ULTRA TEST test strip CHECK BLOOD SUGAR 4 TIMES  DAILY 200 each 11   simvastatin (ZOCOR) 20 MG tablet TAKE 1 TABLET BY MOUTH ONCE DAILY AT  6PM 90 tablet 1   No current facility-administered medications for this visit.    Allergies  Allergen Reactions   Sulfa Antibiotics Hives    Family History  Problem Relation Age of Onset   Uterine cancer Mother    Breast cancer Mother 53   Pancreatic cancer Mother    Heart disease Father    Hypertension Father    Anxiety disorder Father    Diabetes Father    Uterine cancer Maternal Aunt    Breast cancer Maternal Aunt 60   Heart  disease Paternal Grandfather     Social History   Socioeconomic History   Marital status: Married    Spouse name: Not on file   Number of children: Not on file   Years of education: Not on file   Highest education level: Not on file  Occupational History   Not on file  Tobacco Use   Smoking status: Every Day    Packs/day: 0.75    Years: 30.00    Pack years: 22.50    Types: Cigarettes   Smokeless tobacco: Never  Substance and Sexual Activity   Alcohol use: No    Alcohol/week: 0.0 standard drinks   Drug use: No   Sexual activity: Never  Other Topics Concern   Not on file  Social History Narrative   Pt lives with husband Legrand Como.  Is not currently working.    Social Determinants of Health   Financial Resource Strain:  Not on file  Food Insecurity: Not on file  Transportation Needs: Not on file  Physical Activity: Not on file  Stress: Not on file  Social Connections: Not on file  Intimate Partner Violence: Not on file    Hospitiliaztions: None  Health Maintenance:    Flu: 12/2020  Tetanus: 06/2017  Pneumovax: 06/2019  Prevnar: 05/2015  Shingrix: never  Covid: [place] x 2  Mammogram: more than 2 years ago  Pap Smear: hysterectomy  Bone Density:  Colon Screening: 02/2016, 10 years  Eye Doctor: every 2 years  Dental Exam: as needed   Providers:   PCP: Webb Silversmith, NP   I have personally reviewed and have noted:  1. The patient's medical and social history 2. Their use of alcohol, tobacco or illicit drugs 3. Their current medications and supplements 4. The patient's functional ability including ADL's, fall risks, home safety risks and hearing or visual impairment. 5. Diet and physical activities 6. Evidence for depression or mood disorder  Subjective:   Review of Systems:   Constitutional: Pt reports headaches. Denies fever, malaise, fatigue, or abrupt weight changes.  HEENT: Denies eye pain, eye redness, ear pain, ringing in the ears, wax buildup, runny nose, nasal congestion, bloody nose, or sore throat. Respiratory: Denies difficulty breathing, shortness of breath, cough or sputum production.   Cardiovascular: Denies chest pain, chest tightness, palpitations or swelling in the hands or feet.  Gastrointestinal: Pt reports occasional GI upset with Metformin. Denies abdominal pain, bloating, constipation, or blood in the stool.  GU: Denies urgency, frequency, pain with urination, burning sensation, blood in urine, odor or discharge. Musculoskeletal: Denies decrease in range of motion, difficulty with gait, muscle pain or joint pain and swelling.  Skin: Denies redness, rashes, lesions or ulcercations.  Neurological: Pt reports difficulty with speech, difficulty with balance. Denies  dizziness, difficulty with memory, or problems with coordination.  Psych: Pt has a history of anxiety and depression. Denies SI/HI.  No other specific complaints in a complete review of systems (except as listed in HPI above).  Objective:  PE:   BP (!) 102/56 (BP Location: Right Arm, Patient Position: Sitting, Cuff Size: Large)   Pulse 69   Temp 97.8 F (36.6 C) (Temporal)   Resp 17   Ht 5' 3.5" (1.613 m)   Wt 172 lb 12.8 oz (78.4 kg)   SpO2 100%   BMI 30.13 kg/m   Wt Readings from Last 3 Encounters:  12/08/20 177 lb (80.3 kg)  12/07/19 172 lb (78 kg)  10/05/19 170 lb 3.2 oz (77.2 kg)  General: Appears her stated age, obese, in NAD. Skin: Warm, dry and intact. No  ulcerations noted. HEENT: Head: normal shape and size; Eyes: sclera white and  EOMs intact;  Neck: Neck supple, trachea midline. No masses, lumps or thyromegaly present.  Cardiovascular: Normal rate and rhythm. S1,S2 noted.  No murmur, rubs or gallops noted. No JVD or BLE edema. No carotid bruits noted. Pulmonary/Chest: Normal effort and positive vesicular breath sounds. No respiratory distress. No wheezes, rales or ronchi noted.  Abdomen: Soft and nontender. Normal bowel sounds. No distention or masses noted. Liver, spleen and kidneys non palpable. Musculoskeletal: Strength 5/5 BUE/BLE. No signs of joint swelling.  Neurological: Alert and oriented. Cranial nerves II-XII grossly intact. Coordination normal.  Psychiatric: Mood and affect normal. Behavior is normal. Judgment and thought content normal.    BMET    Component Value Date/Time   NA 136 12/08/2020 1432   NA 141 03/16/2018 1537   NA 137 05/06/2012 0949   K 4.5 12/08/2020 1432   K 4.5 05/06/2012 0949   CL 101 12/08/2020 1432   CL 103 05/06/2012 0949   CO2 29 12/08/2020 1432   CO2 27 05/06/2012 0949   GLUCOSE 104 (H) 12/08/2020 1432   GLUCOSE 123 (H) 05/06/2012 0949   BUN 11 12/08/2020 1432   BUN 13 03/16/2018 1537   BUN 10 05/06/2012 0949    CREATININE 0.63 12/08/2020 1432   CREATININE 0.73 05/06/2012 0949   CALCIUM 9.7 12/08/2020 1432   CALCIUM 9.2 05/06/2012 0949   GFRNONAA 90 03/16/2018 1537   GFRNONAA >60 05/06/2012 0949   GFRAA 103 03/16/2018 1537   GFRAA >60 05/06/2012 0949    Lipid Panel     Component Value Date/Time   CHOL 137 12/08/2020 1432   CHOL 135 02/28/2017 1604   TRIG 170.0 (H) 12/08/2020 1432   HDL 43.90 12/08/2020 1432   HDL 38 (L) 02/28/2017 1604   CHOLHDL 3 12/08/2020 1432   VLDL 34.0 12/08/2020 1432   LDLCALC 59 12/08/2020 1432   LDLCALC 70 02/28/2017 1604    CBC    Component Value Date/Time   WBC 9.6 12/08/2020 1432   RBC 5.15 (H) 12/08/2020 1432   HGB 14.5 12/08/2020 1432   HGB 15.6 03/16/2018 1537   HCT 43.8 12/08/2020 1432   HCT 47.5 (H) 03/16/2018 1537   PLT 268.0 12/08/2020 1432   PLT 314 03/16/2018 1537   MCV 84.9 12/08/2020 1432   MCV 88 03/16/2018 1537   MCV 88 05/06/2012 0949   MCH 29.0 03/16/2018 1537   MCH 28.7 05/06/2012 0949   MCHC 33.1 12/08/2020 1432   RDW 14.5 12/08/2020 1432   RDW 14.1 03/16/2018 1537   RDW 13.7 05/06/2012 0949   LYMPHSABS 5.1 (H) 02/28/2017 1604   EOSABS 0.1 02/28/2017 1604   BASOSABS 0.1 02/28/2017 1604    Hgb A1C Lab Results  Component Value Date   HGBA1C 8.5 (H) 12/08/2020      Assessment and Plan:   Medicare Annual Wellness Visit:  Diet: She does eat some meat. She consumes fruits and veggies. She does eat some fried foods. She drinks mostly water and coffee. Physical activity: Sedentary Depression/mood screen: Chronic, PHQ 9 score of 4 Hearing: Intact to whispered voice Visual acuity: Grossly normal ADLs: Capable Fall risk: None Home safety: Good Cognitive evaluation: Intact to orientation, naming, recall and repetition EOL planning: No adv directives, full code/ I agree  Preventative Medicine: Flu shot today.  Tetanus UTD.  Pneumovax UTD.  Encouraged her to  get a Shingrix vaccine at the pharmacy.  Encouraged her to get  her COVID booster.  She no longer needs Pap smears.  Mammogram and bone density ordered.  Colon screening UTD.  Encouraged her to consume a balanced diet and exercise regimen.  Advised her to see an eye doctor and dentist annually.  Will check CBC, c-Met, lipid and A1c today. Due dates for screening exam given to patient as part of her AVS.   Next appointment: 3 months, follow up chronic conditions   Webb Silversmith, NP This visit occurred during the SARS-CoV-2 public health emergency.  Safety protocols were in place, including screening questions prior to the visit, additional usage of staff PPE, and extensive cleaning of exam room while observing appropriate contact time as indicated for disinfecting solutions.

## 2021-08-21 NOTE — Assessment & Plan Note (Signed)
A1c today No urine microalbumin secondary to ACEI therapy Encouraged her to consume a low-carb diet and exercise for weight loss Continue Glipizide, Metformin, Lantus and Gabapentin Encouraged her to schedule an appointment for an eye exam Encourage routine foot exams Flu shot today Pneumovax UTD Encouraged her to get her COVID booster

## 2021-08-21 NOTE — Assessment & Plan Note (Signed)
C-Met and lipid profile today Encouraged her to consume a low-fat diet Continue Simvastatin 

## 2021-08-21 NOTE — Assessment & Plan Note (Signed)
Encourage diet and exercise for weight loss 

## 2021-08-21 NOTE — Assessment & Plan Note (Signed)
Currently not medicated

## 2021-08-21 NOTE — Assessment & Plan Note (Signed)
Continue Citalopram, Buspirone and Clonazepam Support offered

## 2021-08-21 NOTE — Assessment & Plan Note (Signed)
Try to identify triggers Continue Tylenol OTC as needed

## 2021-08-21 NOTE — Patient Instructions (Signed)

## 2021-08-21 NOTE — Assessment & Plan Note (Signed)
Continue Clonazepam as needed

## 2021-08-21 NOTE — Assessment & Plan Note (Signed)
Continue Gabapentin

## 2021-08-22 LAB — COMPLETE METABOLIC PANEL WITH GFR
AG Ratio: 1.6 (calc) (ref 1.0–2.5)
ALT: 12 U/L (ref 6–29)
AST: 12 U/L (ref 10–35)
Albumin: 4.4 g/dL (ref 3.6–5.1)
Alkaline phosphatase (APISO): 50 U/L (ref 37–153)
BUN: 10 mg/dL (ref 7–25)
CO2: 24 mmol/L (ref 20–32)
Calcium: 10 mg/dL (ref 8.6–10.4)
Chloride: 102 mmol/L (ref 98–110)
Creat: 0.59 mg/dL (ref 0.50–1.05)
Globulin: 2.8 g/dL (calc) (ref 1.9–3.7)
Glucose, Bld: 123 mg/dL (ref 65–139)
Potassium: 4.6 mmol/L (ref 3.5–5.3)
Sodium: 138 mmol/L (ref 135–146)
Total Bilirubin: 0.5 mg/dL (ref 0.2–1.2)
Total Protein: 7.2 g/dL (ref 6.1–8.1)
eGFR: 102 mL/min/{1.73_m2} (ref >=60)

## 2021-08-22 LAB — CBC
HCT: 46.7 % — ABNORMAL HIGH (ref 35.0–45.0)
Hemoglobin: 15.6 g/dL — ABNORMAL HIGH (ref 11.7–15.5)
MCH: 29.5 pg (ref 27.0–33.0)
MCHC: 33.4 g/dL (ref 32.0–36.0)
MCV: 88.3 fL (ref 80.0–100.0)
MPV: 12 fL (ref 7.5–12.5)
Platelets: 173 10*3/uL (ref 140–400)
RBC: 5.29 10*6/uL — ABNORMAL HIGH (ref 3.80–5.10)
RDW: 13 % (ref 11.0–15.0)
WBC: 10.6 10*3/uL (ref 3.8–10.8)

## 2021-08-22 LAB — LIPID PANEL
Cholesterol: 209 mg/dL — ABNORMAL HIGH (ref <200)
HDL: 46 mg/dL — ABNORMAL LOW (ref >=50)
LDL Cholesterol (Calc): 122 mg/dL (calc) — ABNORMAL HIGH
Non-HDL Cholesterol (Calc): 163 mg/dL (calc) — ABNORMAL HIGH (ref <130)
Total CHOL/HDL Ratio: 4.5 (calc) (ref <5.0)
Triglycerides: 264 mg/dL — ABNORMAL HIGH (ref <150)

## 2021-08-22 LAB — HEMOGLOBIN A1C
Hgb A1c MFr Bld: 7.5 % of total Hgb — ABNORMAL HIGH (ref <5.7)
Mean Plasma Glucose: 169 mg/dL
eAG (mmol/L): 9.3 mmol/L

## 2021-08-23 ENCOUNTER — Encounter: Payer: Self-pay | Admitting: Internal Medicine

## 2021-08-23 DIAGNOSIS — F32A Depression, unspecified: Secondary | ICD-10-CM

## 2021-08-23 DIAGNOSIS — F419 Anxiety disorder, unspecified: Secondary | ICD-10-CM

## 2021-08-23 DIAGNOSIS — R202 Paresthesia of skin: Secondary | ICD-10-CM

## 2021-08-23 MED ORDER — LISINOPRIL 5 MG PO TABS: 5.0000 mg | ORAL_TABLET | Freq: Every day | ORAL | 0 refills | Status: DC

## 2021-08-23 MED ORDER — MECLIZINE HCL 25 MG PO TABS: ORAL_TABLET | ORAL | 0 refills | Status: DC

## 2021-08-23 MED ORDER — GABAPENTIN 300 MG PO CAPS: 300.0000 mg | ORAL_CAPSULE | Freq: Four times a day (QID) | ORAL | 0 refills | Status: DC

## 2021-08-23 MED ORDER — METFORMIN HCL 1000 MG PO TABS: 1000.0000 mg | ORAL_TABLET | Freq: Two times a day (BID) | ORAL | 0 refills | Status: DC

## 2021-08-23 MED ORDER — CITALOPRAM HYDROBROMIDE 20 MG PO TABS: 20.0000 mg | ORAL_TABLET | Freq: Every day | ORAL | 0 refills | Status: DC

## 2021-08-23 MED ORDER — CLONAZEPAM 0.5 MG PO TABS: 0.2500 mg | ORAL_TABLET | Freq: Two times a day (BID) | ORAL | 0 refills | Status: DC | PRN

## 2021-08-23 MED ORDER — BUSPIRONE HCL 5 MG PO TABS: 5.0000 mg | ORAL_TABLET | Freq: Two times a day (BID) | ORAL | 0 refills | Status: DC

## 2021-08-23 MED ORDER — GLIPIZIDE 10 MG PO TABS: ORAL_TABLET | ORAL | 0 refills | Status: DC

## 2021-08-23 MED ORDER — SIMVASTATIN 20 MG PO TABS: ORAL_TABLET | ORAL | 0 refills | Status: DC

## 2021-08-23 MED ORDER — LANTUS SOLOSTAR 100 UNIT/ML ~~LOC~~ SOPN: 30.0000 [IU] | PEN_INJECTOR | Freq: Every day | SUBCUTANEOUS | 0 refills | Status: DC

## 2021-09-06 ENCOUNTER — Ambulatory Visit
Admission: RE | Admit: 2021-09-06 | Discharge: 2021-09-06 | Disposition: A | Discharge status: Home / Self Care | Payer: Medicare Other | Source: Ambulatory Visit | Attending: Internal Medicine | Admitting: Internal Medicine

## 2021-09-06 ENCOUNTER — Other Ambulatory Visit: Payer: Self-pay

## 2021-09-06 DIAGNOSIS — Z780 Asymptomatic menopausal state: Secondary | ICD-10-CM | POA: Diagnosis not present

## 2021-09-06 DIAGNOSIS — Z1231 Encounter for screening mammogram for malignant neoplasm of breast: Secondary | ICD-10-CM | POA: Diagnosis not present

## 2021-09-06 DIAGNOSIS — M85852 Other specified disorders of bone density and structure, left thigh: Secondary | ICD-10-CM | POA: Diagnosis not present

## 2021-11-21 ENCOUNTER — Ambulatory Visit (INDEPENDENT_AMBULATORY_CARE_PROVIDER_SITE_OTHER): Payer: Medicare Other | Admitting: Internal Medicine

## 2021-11-21 ENCOUNTER — Other Ambulatory Visit: Payer: Self-pay

## 2021-11-21 ENCOUNTER — Encounter: Payer: Self-pay | Admitting: Internal Medicine

## 2021-11-21 VITALS — BP 108/76 | HR 72 | Ht 63.5 in | Wt 183.0 lb

## 2021-11-21 DIAGNOSIS — E349 Endocrine disorder, unspecified: Secondary | ICD-10-CM

## 2021-11-21 DIAGNOSIS — R519 Headache, unspecified: Secondary | ICD-10-CM

## 2021-11-21 DIAGNOSIS — E7800 Pure hypercholesterolemia, unspecified: Secondary | ICD-10-CM

## 2021-11-21 DIAGNOSIS — J383 Other diseases of vocal cords: Secondary | ICD-10-CM

## 2021-11-21 DIAGNOSIS — E6609 Other obesity due to excess calories: Secondary | ICD-10-CM

## 2021-11-21 DIAGNOSIS — G63 Polyneuropathy in diseases classified elsewhere: Secondary | ICD-10-CM

## 2021-11-21 DIAGNOSIS — Z794 Long term (current) use of insulin: Secondary | ICD-10-CM

## 2021-11-21 DIAGNOSIS — Z6831 Body mass index (BMI) 31.0-31.9, adult: Secondary | ICD-10-CM

## 2021-11-21 DIAGNOSIS — E1165 Type 2 diabetes mellitus with hyperglycemia: Secondary | ICD-10-CM

## 2021-11-21 DIAGNOSIS — I10 Essential (primary) hypertension: Secondary | ICD-10-CM

## 2021-11-21 DIAGNOSIS — F419 Anxiety disorder, unspecified: Secondary | ICD-10-CM

## 2021-11-21 DIAGNOSIS — H8103 Meniere's disease, bilateral: Secondary | ICD-10-CM | POA: Diagnosis not present

## 2021-11-21 DIAGNOSIS — F32A Depression, unspecified: Secondary | ICD-10-CM

## 2021-11-21 NOTE — Assessment & Plan Note (Signed)
A1c and urine microalbumin today Encouraged her to consume a low-carb diet and exercise for weight loss Continue Glipizide, Lantus and Gabapentin as prescribed Encourage routine eye exams Encourage routine foot exams Flu shot UTD Pneumovax UTD Encouraged her to get her COVID booster

## 2021-11-21 NOTE — Patient Instructions (Signed)

## 2021-11-21 NOTE — Progress Notes (Signed)
Subjective:    Patient ID: Brittany Pruitt, female    DOB: Aug 11, 1961, 61 y.o.   MRN: [id number]  HPI  Patient presents to clinic today for follow-up of chronic conditions.  Anxiety and Depression: Chronic, managed on Citalopram, Buspirone and Clonazepam.  She is not currently seeing a therapist.  She denies SI/HI.  DM2: Her last A1c was 7.5, 08/2021.  She is taking Glipizide, Lantus and Gabapentin as prescribed.  Her sugars range 126-300.  She checks her feet routinely.  Her last eye exam was more than 1 year ago.  Flu 08/2021.  Pneumovax 06/2019.  [place] x 2.  HTN: Her BP today is 108/76.  She is taking Lisinopril as prescribed.  There is no ECG on file.  HLD: Her last LDL was 122, triglycerides 264, 08/2021.  She denies myalgias on Simvastatin.  She tries to consume a low-fat diet.  Frequent Headaches: Triggered by stress.  She takes Tylenol as needed with good relief of symptoms.  She does not follow with neurology.  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  She is not following with ENT.  Polycythemia: Her last H/H was 15.6/46.7, 08/2021.  She does smoke.  She does not follow with hematology.  Review of Systems     Past Medical History:  Diagnosis Date   Chicken pox    Depression    Diabetes mellitus without complication (HCC)    Frequent headaches     Current Outpatient Medications  Medication Sig Dispense Refill   busPIRone (BUSPAR) 5 MG tablet Take 1 tablet (5 mg total) by mouth 2 (two) times daily. 180 tablet 0   citalopram (CELEXA) 20 MG tablet Take 1 tablet (20 mg total) by mouth daily. 90 tablet 0   clonazePAM (KLONOPIN) 0.5 MG tablet Take 0.5-1 tablets (0.25-0.5 mg total) by mouth 2 (two) times daily as needed for anxiety. 45 tablet 0   gabapentin (NEURONTIN) 300 MG capsule Take 1 capsule (300 mg total) by mouth 4 (four) times daily. 360 capsule 0   glipiZIDE  (GLUCOTROL) 10 MG tablet TAKE 1 TABLET BY MOUTH TWICE DAILY BEFORE A MEAL 180 tablet 0   insulin glargine (LANTUS SOLOSTAR) 100 UNIT/ML Solostar Pen Inject 30 Units into the skin daily. 30 mL 0   lisinopril (ZESTRIL) 5 MG tablet Take 1 tablet (5 mg total) by mouth daily. 90 tablet 0   meclizine (ANTIVERT) 25 MG tablet TAKE 1 TABLET BY MOUTH THREE TIMES DAILY AS NEEDED FOR  DIZZINESS 30 tablet 0   metFORMIN (GLUCOPHAGE) 1000 MG tablet Take 1 tablet (1,000 mg total) by mouth 2 (two) times daily with a meal. 180 tablet 0   Omega-3 Fatty Acids (FISH OIL) 1000 MG CAPS Take by mouth.     ONE TOUCH ULTRA TEST test strip CHECK BLOOD SUGAR 4 TIMES  DAILY 200 each 11   simvastatin (ZOCOR) 20 MG tablet TAKE 1 TABLET BY MOUTH ONCE DAILY AT  6PM 90 tablet 0   vitamin B-12 (CYANOCOBALAMIN) 500 MCG tablet Take 500 mcg by mouth daily.     vitamin C (ASCORBIC ACID) 500 MG tablet Take 500 mg by mouth daily.     No current facility-administered medications for this visit.    Allergies  Allergen Reactions   Sulfa Antibiotics Hives    Family History  Problem Relation Age of Onset   Uterine cancer Mother    Breast cancer Mother 8   Pancreatic  cancer Mother    Heart disease Father    Hypertension Father    Anxiety disorder Father    Diabetes Father    Uterine cancer Maternal Aunt    Breast cancer Maternal Aunt 20   Heart disease Paternal Grandfather     Social History   Socioeconomic History   Marital status: Married    Spouse name: Not on file   Number of children: Not on file   Years of education: Not on file   Highest education level: Not on file  Occupational History   Not on file  Tobacco Use   Smoking status: Every Day    Packs/day: 1.00    Years: 30.00    Pack years: 30.00    Types: Cigarettes   Smokeless tobacco: Never  Vaping Use   Vaping Use: Never used  Substance and Sexual Activity   Alcohol use: No    Alcohol/week: 0.0 standard drinks   Drug use: No   Sexual activity:  Never  Other Topics Concern   Not on file  Social History Narrative   Pt lives with husband Legrand Como.  Is not currently working.    Social Determinants of Health   Financial Resource Strain: Not on file  Food Insecurity: Not on file  Transportation Needs: Not on file  Physical Activity: Not on file  Stress: Not on file  Social Connections: Not on file  Intimate Partner Violence: Not on file     Constitutional: Patient reports headaches.  Denies fever, malaise, fatigue, or abrupt weight changes.  HEENT: Denies eye pain, eye redness, ear pain, ringing in the ears, wax buildup, runny nose, nasal congestion, bloody nose, or sore throat. Respiratory: Denies difficulty breathing, shortness of breath, cough or sputum production.   Cardiovascular: Denies chest pain, chest tightness, palpitations or swelling in the hands or feet.  Gastrointestinal: Denies abdominal pain, bloating, constipation, diarrhea or blood in the stool.  GU: Denies urgency, frequency, pain with urination, burning sensation, blood in urine, odor or discharge. Musculoskeletal: Denies decrease in range of motion, difficulty with gait, muscle pain or joint pain and swelling.  Skin: Denies redness, rashes, lesions or ulcercations.  Neurological: Patient reports intermittent dizziness, difficulty with speech, neuropathic pain.  Denies difficulty with memory,  or problems with balance and coordination.  Psych: Patient has a history of anxiety and depression.  Denies SI/HI.  No other specific complaints in a complete review of systems (except as listed in HPI above).  Objective:   Physical Exam  BP 108/76 (BP Location: Left Arm, Patient Position: Sitting, Cuff Size: Normal)    Pulse 72    Ht 5' 3.5" (1.613 m)    Wt 183 lb (83 kg)    SpO2 98%    BMI 31.91 kg/m   Wt Readings from Last 3 Encounters:  08/21/21 172 lb 12.8 oz (78.4 kg)  12/08/20 177 lb (80.3 kg)  12/07/19 172 lb (78 kg)    General: Appears her stated age,  obese, in NAD. Skin: Warm, dry and intact. No ulcerations noted. HEENT: Head: normal shape and size; Eyes: sclera white and EOMs intact; Neck:  Neck supple, trachea midline. No masses, lumps or thyromegaly present.  Cardiovascular: Normal rate and rhythm. S1,S2 noted.  No murmur, rubs or gallops noted. No JVD or BLE edema. No carotid bruits noted. Pulmonary/Chest: Normal effort and positive vesicular breath sounds. No respiratory distress. No wheezes, rales or ronchi noted.  Musculoskeletal: Strength 5/5 BUE/BLE.  No difficulty with gait.  Neurological: Alert  and oriented. Cranial nerves II-XII grossly intact. Coordination normal.  Psychiatric: Mood and affect normal. Behavior is normal. Judgment and thought content normal.    BMET    Component Value Date/Time   NA 138 08/21/2021 1442   NA 141 03/16/2018 1537   NA 137 05/06/2012 0949   K 4.6 08/21/2021 1442   K 4.5 05/06/2012 0949   CL 102 08/21/2021 1442   CL 103 05/06/2012 0949   CO2 24 08/21/2021 1442   CO2 27 05/06/2012 0949   GLUCOSE 123 08/21/2021 1442   GLUCOSE 123 (H) 05/06/2012 0949   BUN 10 08/21/2021 1442   BUN 13 03/16/2018 1537   BUN 10 05/06/2012 0949   CREATININE 0.59 08/21/2021 1442   CALCIUM 10.0 08/21/2021 1442   CALCIUM 9.2 05/06/2012 0949   GFRNONAA 90 03/16/2018 1537   GFRNONAA >60 05/06/2012 0949   GFRAA 103 03/16/2018 1537   GFRAA >60 05/06/2012 0949    Lipid Panel     Component Value Date/Time   CHOL 209 (H) 08/21/2021 1442   CHOL 135 02/28/2017 1604   TRIG 264 (H) 08/21/2021 1442   HDL 46 (L) 08/21/2021 1442   HDL 38 (L) 02/28/2017 1604   CHOLHDL 4.5 08/21/2021 1442   VLDL 34.0 12/08/2020 1432   LDLCALC 122 (H) 08/21/2021 1442    CBC    Component Value Date/Time   WBC 10.6 08/21/2021 1442   RBC 5.29 (H) 08/21/2021 1442   HGB 15.6 (H) 08/21/2021 1442   HGB 15.6 03/16/2018 1537   HCT 46.7 (H) 08/21/2021 1442   HCT 47.5 (H) 03/16/2018 1537   PLT 173 08/21/2021 1442   PLT 314 03/16/2018  1537   MCV 88.3 08/21/2021 1442   MCV 88 03/16/2018 1537   MCV 88 05/06/2012 0949   MCH 29.5 08/21/2021 1442   MCHC 33.4 08/21/2021 1442   RDW 13.0 08/21/2021 1442   RDW 14.1 03/16/2018 1537   RDW 13.7 05/06/2012 0949   LYMPHSABS 5.1 (H) 02/28/2017 1604   EOSABS 0.1 02/28/2017 1604   BASOSABS 0.1 02/28/2017 1604    Hgb A1C Lab Results  Component Value Date   HGBA1C 7.5 (H) 08/21/2021            Assessment & Plan:    Webb Silversmith, NP This visit occurred during the SARS-CoV-2 public health emergency.  Safety protocols were in place, including screening questions prior to the visit, additional usage of staff PPE, and extensive cleaning of exam room while observing appropriate contact time as indicated for disinfecting solutions.

## 2021-11-21 NOTE — Assessment & Plan Note (Signed)
Stable on Citalopram, Buspirone and Clonazepam Support offered

## 2021-11-21 NOTE — Assessment & Plan Note (Signed)
Controlled on Lisinopril C-Met today Reinforced DASH diet and exercise for weight loss

## 2021-11-21 NOTE — Assessment & Plan Note (Signed)
Continue Gabapentin

## 2021-11-21 NOTE — Assessment & Plan Note (Signed)
Encourage diet and exercise for weight loss 

## 2021-11-21 NOTE — Assessment & Plan Note (Signed)
C-Met and lipid profile today Encouraged her to consume a low-fat diet Continue Simvastatin 

## 2021-11-21 NOTE — Assessment & Plan Note (Signed)
Encourage stress reduction techniques Continue Tylenol OTC as needed 

## 2021-11-21 NOTE — Assessment & Plan Note (Signed)
Continue Clonazepam as needed

## 2021-11-21 NOTE — Assessment & Plan Note (Signed)
Currently not an issue Will monitor 

## 2021-11-22 ENCOUNTER — Encounter: Payer: Self-pay | Admitting: Internal Medicine

## 2021-11-22 DIAGNOSIS — F419 Anxiety disorder, unspecified: Secondary | ICD-10-CM

## 2021-11-22 DIAGNOSIS — R202 Paresthesia of skin: Secondary | ICD-10-CM

## 2021-11-22 DIAGNOSIS — H524 Presbyopia: Secondary | ICD-10-CM | POA: Diagnosis not present

## 2021-11-22 DIAGNOSIS — E113393 Type 2 diabetes mellitus with moderate nonproliferative diabetic retinopathy without macular edema, bilateral: Secondary | ICD-10-CM | POA: Diagnosis not present

## 2021-11-22 DIAGNOSIS — F32A Depression, unspecified: Secondary | ICD-10-CM

## 2021-11-22 LAB — MICROALBUMIN / CREATININE URINE RATIO
Creatinine, Urine: 64 mg/dL (ref 20–275)
Microalb Creat Ratio: 9 mcg/mg creat (ref <30)
Microalb, Ur: 0.6 mg/dL

## 2021-11-22 LAB — COMPLETE METABOLIC PANEL WITH GFR
AG Ratio: 1.8 (calc) (ref 1.0–2.5)
ALT: 15 U/L (ref 6–29)
AST: 14 U/L (ref 10–35)
Albumin: 4.2 g/dL (ref 3.6–5.1)
Alkaline phosphatase (APISO): 50 U/L (ref 37–153)
BUN: 10 mg/dL (ref 7–25)
CO2: 31 mmol/L (ref 20–32)
Calcium: 9.6 mg/dL (ref 8.6–10.4)
Chloride: 102 mmol/L (ref 98–110)
Creat: 0.67 mg/dL (ref 0.50–1.05)
Globulin: 2.4 g/dL (calc) (ref 1.9–3.7)
Glucose, Bld: 126 mg/dL — ABNORMAL HIGH (ref 65–99)
Potassium: 4.9 mmol/L (ref 3.5–5.3)
Sodium: 138 mmol/L (ref 135–146)
Total Bilirubin: 0.6 mg/dL (ref 0.2–1.2)
Total Protein: 6.6 g/dL (ref 6.1–8.1)
eGFR: 99 mL/min/{1.73_m2} (ref >=60)

## 2021-11-22 LAB — LIPID PANEL
Cholesterol: 148 mg/dL (ref <200)
HDL: 42 mg/dL — ABNORMAL LOW (ref >=50)
LDL Cholesterol (Calc): 83 mg/dL (calc)
Non-HDL Cholesterol (Calc): 106 mg/dL (calc) (ref <130)
Total CHOL/HDL Ratio: 3.5 (calc) (ref <5.0)
Triglycerides: 126 mg/dL (ref <150)

## 2021-11-22 LAB — HEMOGLOBIN A1C
Hgb A1c MFr Bld: 10.8 % of total Hgb — ABNORMAL HIGH (ref <5.7)
Mean Plasma Glucose: 263 mg/dL
eAG (mmol/L): 14.6 mmol/L

## 2021-11-22 LAB — CBC
HCT: 47.6 % — ABNORMAL HIGH (ref 35.0–45.0)
Hemoglobin: 15.7 g/dL — ABNORMAL HIGH (ref 11.7–15.5)
MCH: 29 pg (ref 27.0–33.0)
MCHC: 33 g/dL (ref 32.0–36.0)
MCV: 88 fL (ref 80.0–100.0)
MPV: 11.1 fL (ref 7.5–12.5)
Platelets: 278 10*3/uL (ref 140–400)
RBC: 5.41 10*6/uL — ABNORMAL HIGH (ref 3.80–5.10)
RDW: 12.6 % (ref 11.0–15.0)
WBC: 10.6 10*3/uL (ref 3.8–10.8)

## 2021-11-23 MED ORDER — CITALOPRAM HYDROBROMIDE 20 MG PO TABS: 20.0000 mg | ORAL_TABLET | Freq: Every day | ORAL | 0 refills | Status: DC

## 2021-11-23 MED ORDER — LISINOPRIL 5 MG PO TABS: 5.0000 mg | ORAL_TABLET | Freq: Every day | ORAL | 0 refills | Status: DC

## 2021-11-23 MED ORDER — CLONAZEPAM 0.5 MG PO TABS: 0.2500 mg | ORAL_TABLET | Freq: Two times a day (BID) | ORAL | 0 refills | Status: DC | PRN

## 2021-11-23 MED ORDER — GLIPIZIDE 10 MG PO TABS: ORAL_TABLET | ORAL | 0 refills | Status: DC

## 2021-11-23 MED ORDER — SIMVASTATIN 20 MG PO TABS: ORAL_TABLET | ORAL | 0 refills | Status: DC

## 2021-11-23 MED ORDER — LANTUS SOLOSTAR 100 UNIT/ML ~~LOC~~ SOPN: 50.0000 [IU] | PEN_INJECTOR | Freq: Every day | SUBCUTANEOUS | 0 refills | Status: DC

## 2021-11-23 MED ORDER — BUSPIRONE HCL 5 MG PO TABS: 5.0000 mg | ORAL_TABLET | Freq: Two times a day (BID) | ORAL | 0 refills | Status: DC

## 2021-11-23 MED ORDER — GABAPENTIN 300 MG PO CAPS: 300.0000 mg | ORAL_CAPSULE | Freq: Four times a day (QID) | ORAL | 0 refills | Status: DC

## 2021-11-26 MED ORDER — LISINOPRIL 5 MG PO TABS: 5.0000 mg | ORAL_TABLET | Freq: Every day | ORAL | 0 refills | Status: DC

## 2021-11-26 MED ORDER — LANTUS SOLOSTAR 100 UNIT/ML ~~LOC~~ SOPN: 50.0000 [IU] | PEN_INJECTOR | Freq: Every day | SUBCUTANEOUS | 0 refills | Status: DC

## 2021-11-26 MED ORDER — SIMVASTATIN 20 MG PO TABS: ORAL_TABLET | ORAL | 0 refills | Status: DC

## 2021-11-26 MED ORDER — CITALOPRAM HYDROBROMIDE 20 MG PO TABS: 20.0000 mg | ORAL_TABLET | Freq: Every day | ORAL | 0 refills | Status: DC

## 2021-11-26 MED ORDER — BUSPIRONE HCL 5 MG PO TABS: 5.0000 mg | ORAL_TABLET | Freq: Two times a day (BID) | ORAL | 0 refills | Status: DC

## 2021-11-26 MED ORDER — GLIPIZIDE 10 MG PO TABS: ORAL_TABLET | ORAL | 0 refills | Status: DC

## 2021-11-26 MED ORDER — CLONAZEPAM 0.5 MG PO TABS: 0.2500 mg | ORAL_TABLET | Freq: Two times a day (BID) | ORAL | 0 refills | Status: DC | PRN

## 2021-11-26 MED ORDER — GABAPENTIN 300 MG PO CAPS: 300.0000 mg | ORAL_CAPSULE | Freq: Four times a day (QID) | ORAL | 0 refills | Status: DC

## 2021-11-26 NOTE — Telephone Encounter (Signed)
Can you please call and cancel any refills of file from me at [facility] for this patient?

## 2021-11-26 NOTE — Addendum Note (Signed)
Addended by: [name] on: 11/26/2021 12:19 PM   Modules accepted: Orders

## 2022-02-18 ENCOUNTER — Ambulatory Visit: Payer: Medicare Other | Admitting: Internal Medicine

## 2022-02-21 ENCOUNTER — Encounter: Payer: Self-pay | Admitting: Internal Medicine

## 2022-02-21 ENCOUNTER — Ambulatory Visit (INDEPENDENT_AMBULATORY_CARE_PROVIDER_SITE_OTHER): Payer: Medicare Other | Admitting: Internal Medicine

## 2022-02-21 VITALS — BP 116/74 | HR 62 | Temp 97.3°F | Wt 186.0 lb

## 2022-02-21 DIAGNOSIS — M8589 Other specified disorders of bone density and structure, multiple sites: Secondary | ICD-10-CM

## 2022-02-21 DIAGNOSIS — J383 Other diseases of vocal cords: Secondary | ICD-10-CM

## 2022-02-21 DIAGNOSIS — E1165 Type 2 diabetes mellitus with hyperglycemia: Secondary | ICD-10-CM

## 2022-02-21 DIAGNOSIS — Z794 Long term (current) use of insulin: Secondary | ICD-10-CM

## 2022-02-21 DIAGNOSIS — E349 Endocrine disorder, unspecified: Secondary | ICD-10-CM

## 2022-02-21 DIAGNOSIS — D751 Secondary polycythemia: Secondary | ICD-10-CM

## 2022-02-21 DIAGNOSIS — H8103 Meniere's disease, bilateral: Secondary | ICD-10-CM

## 2022-02-21 DIAGNOSIS — G63 Polyneuropathy in diseases classified elsewhere: Secondary | ICD-10-CM

## 2022-02-21 DIAGNOSIS — D72829 Elevated white blood cell count, unspecified: Secondary | ICD-10-CM | POA: Insufficient documentation

## 2022-02-21 DIAGNOSIS — Z6832 Body mass index (BMI) 32.0-32.9, adult: Secondary | ICD-10-CM

## 2022-02-21 DIAGNOSIS — F32A Depression, unspecified: Secondary | ICD-10-CM

## 2022-02-21 DIAGNOSIS — F419 Anxiety disorder, unspecified: Secondary | ICD-10-CM

## 2022-02-21 DIAGNOSIS — R202 Paresthesia of skin: Secondary | ICD-10-CM

## 2022-02-21 DIAGNOSIS — I10 Essential (primary) hypertension: Secondary | ICD-10-CM | POA: Diagnosis not present

## 2022-02-21 DIAGNOSIS — E6609 Other obesity due to excess calories: Secondary | ICD-10-CM

## 2022-02-21 DIAGNOSIS — M8580 Other specified disorders of bone density and structure, unspecified site: Secondary | ICD-10-CM | POA: Insufficient documentation

## 2022-02-21 DIAGNOSIS — E7800 Pure hypercholesterolemia, unspecified: Secondary | ICD-10-CM

## 2022-02-21 DIAGNOSIS — R519 Headache, unspecified: Secondary | ICD-10-CM

## 2022-02-21 MED ORDER — LISINOPRIL 5 MG PO TABS: 5.0000 mg | ORAL_TABLET | Freq: Every day | ORAL | 0 refills | Status: DC

## 2022-02-21 MED ORDER — CITALOPRAM HYDROBROMIDE 20 MG PO TABS: 20.0000 mg | ORAL_TABLET | Freq: Every day | ORAL | 0 refills | Status: DC

## 2022-02-21 MED ORDER — GABAPENTIN 300 MG PO CAPS: 300.0000 mg | ORAL_CAPSULE | Freq: Four times a day (QID) | ORAL | 0 refills | Status: DC

## 2022-02-21 MED ORDER — CLONAZEPAM 0.5 MG PO TABS: 0.2500 mg | ORAL_TABLET | Freq: Two times a day (BID) | ORAL | 0 refills | Status: DC | PRN

## 2022-02-21 MED ORDER — GLIPIZIDE 10 MG PO TABS: ORAL_TABLET | ORAL | 0 refills | Status: DC

## 2022-02-21 MED ORDER — BUSPIRONE HCL 5 MG PO TABS: 5.0000 mg | ORAL_TABLET | Freq: Two times a day (BID) | ORAL | 0 refills | Status: DC

## 2022-02-21 MED ORDER — SIMVASTATIN 20 MG PO TABS: ORAL_TABLET | ORAL | 0 refills | Status: DC

## 2022-02-21 MED ORDER — LANTUS SOLOSTAR 100 UNIT/ML ~~LOC~~ SOPN: 50.0000 [IU] | PEN_INJECTOR | Freq: Every day | SUBCUTANEOUS | 0 refills | Status: DC

## 2022-02-21 MED ORDER — MECLIZINE HCL 25 MG PO TABS: ORAL_TABLET | ORAL | 0 refills | Status: DC

## 2022-02-21 NOTE — Assessment & Plan Note (Signed)
Asymptomatic. 

## 2022-02-21 NOTE — Assessment & Plan Note (Signed)
Encourage diet and exercise for weight loss 

## 2022-02-21 NOTE — Assessment & Plan Note (Signed)
Controlled on Lisinopril, refilled today ?Reinforced DASH diet and exercise for weight loss ?

## 2022-02-21 NOTE — Patient Instructions (Signed)

## 2022-02-21 NOTE — Progress Notes (Signed)
? ?Subjective:  ? ? Patient ID: Brittany Pruitt, female    DOB: 02/25/1961, 61 y.o.   MRN: [id number] ? ?HPI ? ?Patient presents to clinic today for 78-monthfollow-up of chronic conditions. ? ?Anxiety and Depression: Chronic, managed on Citalopram, Buspirone and Clonazepam.  She is not currently seeing a therapist.  She denies SI/HI. ? ?DM2 with Peripheral Neuropathy: Her last A1c was 10.8%, 11/2021.  She is taking Glipizide, Lantus and Gabapentin as prescribed.  Her sugars range 80- 180.  She checks her feet routinely.  Her last eye exam was 11/2021.  Flu 08/2021.  Pneumovax 06/2019.  CSublettex2. ? ?HTN: Her BP today is 116/74.  She is taking Lisinopril as prescribed.  There is no ECG on file. ? ?HLD: Her last LDL was 83, triglycerides 126, 11/2021.  She denies myalgias on Simvastatin.  She tries to consume a low-fat diet. ? ?Frequent Headaches: Triggered by stress.  She takes Tylenol as needed with good relief of symptoms.  She does not follow with neurology. ? 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 ? ?M?ni?re's Disease: Currently asymptomatic.  She is not currently taking any medications for this.  She does not follow with ENT. ? ?Polycythemia: Her last H/H was 15.7/47.6, 11/2021..Marland Kitchen She does smoke.  She does not follow with hematology. ? ?Osteopenia: She is taking Calcium and Vitamin D OTC.  She tries to get weightbearing exercise and daily.  Bone density from 09/2021 reviewed. ? ?Review of Systems ? ?   ?Past Medical History:  ?Diagnosis Date  ? Anxiety   ? Chicken pox   ? Depression   ? [place]   ? Frequent headaches   ? Hyperlipidemia   ? ? ?Current Outpatient Medications  ?Medication Sig Dispense Refill  ? busPIRone (BUSPAR) 5 MG tablet Take 1 tablet (5 mg total) by mouth 2 (two) times daily. 180 tablet 0  ? Calcium Carb-Cholecalciferol (CALCIUM 1000 + D PO) Take 1 each by mouth daily.    ? citalopram (CELEXA) 20 MG tablet Take 1 tablet (20 mg total) by  mouth daily. 90 tablet 0  ? clonazePAM (KLONOPIN) 0.5 MG tablet Take 0.5-1 tablets (0.25-0.5 mg total) by mouth 2 (two) times daily as needed for anxiety. 45 tablet 0  ? gabapentin (NEURONTIN) 300 MG capsule Take 1 capsule (300 mg total) by mouth 4 (four) times daily. 360 capsule 0  ? glipiZIDE (GLUCOTROL) 10 MG tablet TAKE 1 TABLET BY MOUTH TWICE DAILY BEFORE A MEAL 180 tablet 0  ? insulin glargine (LANTUS SOLOSTAR) 100 UNIT/ML Solostar Pen Inject 50 Units into the skin daily. 45 mL 0  ? lisinopril (ZESTRIL) 5 MG tablet Take 1 tablet (5 mg total) by mouth daily. 90 tablet 0  ? meclizine (ANTIVERT) 25 MG tablet TAKE 1 TABLET BY MOUTH THREE TIMES DAILY AS NEEDED FOR  DIZZINESS 30 tablet 0  ? Omega-3 Fatty Acids (FISH OIL) 1000 MG CAPS Take 1 capsule by mouth in the morning and at bedtime.    ? ONE TOUCH ULTRA TEST test strip CHECK BLOOD SUGAR 4 TIMES  DAILY 200 each 11  ? simvastatin (ZOCOR) 20 MG tablet TAKE 1 TABLET BY MOUTH ONCE DAILY AT  6PM 90 tablet 0  ? vitamin B-12 (CYANOCOBALAMIN) 500 MCG tablet Take 500 mcg by mouth daily.    ? vitamin C (ASCORBIC ACID) 500 MG tablet Take 500 mg by mouth daily.    ? ?No current facility-administered medications for this visit.  ? ? ?  Allergies  ?Allergen Reactions  ? Sulfa Antibiotics Hives  ? ? ?Family History  ?Problem Relation Age of Onset  ? Uterine cancer Mother   ? Breast cancer Mother 28  ? Pancreatic cancer Mother   ? Heart disease Father   ? Hypertension Father   ? Anxiety disorder Father   ? Diabetes Father   ? Uterine cancer Maternal Aunt   ? Breast cancer Maternal Aunt 60  ? Heart disease Paternal Grandfather   ? ? ?Social History  ? ?Socioeconomic History  ? Marital status: Married  ?  Spouse name: Ruthmary Occhipinti  ? Number of children: Not on file  ? Years of education: Not on file  ? Highest education level: Not on file  ?Occupational History  ? Not on file  ?Tobacco Use  ? Smoking status: Every Day  ?  Packs/day: 1.00  ?  Years: 30.00  ?  Pack years: 30.00   ?  Types: Cigarettes  ? Smokeless tobacco: Never  ?Vaping Use  ? Vaping Use: Never used  ?Substance and Sexual Activity  ? Alcohol use: No  ?  Alcohol/week: 0.0 standard drinks  ? Drug use: Never  ? Sexual activity: Yes  ?  Partners: Male  ?Other Topics Concern  ? Not on file  ?Social History Narrative  ? Pt lives with husband Legrand Como.  Is not currently working.   ? ?Social Determinants of Health  ? ?Financial Resource Strain: Not on file  ?Food Insecurity: Not on file  ?Transportation Needs: Not on file  ?Physical Activity: Not on file  ?Stress: Not on file  ?Social Connections: Not on file  ?Intimate Partner Violence: Not on file  ? ? ? ?Constitutional: Patient reports intermittent headaches.  Denies fever, malaise, fatigue, or abrupt weight changes.  ?HEENT: Denies eye pain, eye redness, ear pain, ringing in the ears, wax buildup, runny nose, nasal congestion, bloody nose, or sore throat. ?Respiratory: Denies difficulty breathing, shortness of breath, cough or sputum production.   ?Cardiovascular: Denies chest pain, chest tightness, palpitations or swelling in the hands or feet.  ?Gastrointestinal: Denies abdominal pain, bloating, constipation, diarrhea or blood in the stool.  ?GU: Denies urgency, frequency, pain with urination, burning sensation, blood in urine, odor or discharge. ?Musculoskeletal: Denies decrease in range of motion, difficulty with gait, muscle pain or joint pain and swelling.  ?Skin: Denies redness, rashes, lesions or ulcercations.  ?Neurological: Pt reports difficulty with speech. Denies dizziness, difficulty with memory, or problems with balance and coordination.  ?Psych: Patient has a history of anxiety and depression.  Denies SI/HI. ? ?No other specific complaints in a complete review of systems (except as listed in HPI above). ? ?Objective:  ? Physical Exam ? ?BP 116/74 (BP Location: Left Arm, Patient Position: Sitting, Cuff Size: Large)   Pulse 62   Temp (!) 97.3 ?F (36.3 ?C)  (Temporal)   Wt 186 lb (84.4 kg)   SpO2 99%   BMI 32.43 kg/m?  ? ?Wt Readings from Last 3 Encounters:  ?11/21/21 183 lb (83 kg)  ?08/21/21 172 lb 12.8 oz (78.4 kg)  ?12/08/20 177 lb (80.3 kg)  ? ? ?General: Appears her stated age, obese, in NAD. ?Skin: Warm, dry and intact. No ulcerations noted. ?HEENT: Head: normal shape and size; Eyes: sclera white, no icterus, conjunctiva pink, PERRLA and EOMs intact;  ?Cardiovascular: Normal rate and rhythm. S1,S2 noted.  No murmur, rubs or gallops noted. No JVD or BLE edema. No carotid bruits noted. ?Pulmonary/Chest: Normal effort and positive  vesicular breath sounds. No respiratory distress. No wheezes, rales or ronchi noted.  ?Musculoskeletal: No difficulty with gait.  ?Neurological: Alert and oriented.  ?Psychiatric: Mood and affect normal. Behavior is normal. Judgment and thought content normal.  ? ?BMET ?   ?Component Value Date/Time  ? NA 138 11/21/2021 1122  ? NA 141 03/16/2018 1537  ? NA 137 05/06/2012 0949  ? K 4.9 11/21/2021 1122  ? K 4.5 05/06/2012 0949  ? CL 102 11/21/2021 1122  ? CL 103 05/06/2012 0949  ? CO2 31 11/21/2021 1122  ? CO2 27 05/06/2012 0949  ? GLUCOSE 126 (H) 11/21/2021 1122  ? GLUCOSE 123 (H) 05/06/2012 0949  ? BUN 10 11/21/2021 1122  ? BUN 13 03/16/2018 1537  ? BUN 10 05/06/2012 0949  ? CREATININE 0.67 11/21/2021 1122  ? CALCIUM 9.6 11/21/2021 1122  ? CALCIUM 9.2 05/06/2012 0949  ? GFRNONAA 90 03/16/2018 1537  ? GFRNONAA >60 05/06/2012 0949  ? GFRAA 103 03/16/2018 1537  ? GFRAA >60 05/06/2012 0949  ? ? ?Lipid Panel  ?   ?Component Value Date/Time  ? CHOL 148 11/21/2021 1122  ? CHOL 135 02/28/2017 1604  ? TRIG 126 11/21/2021 1122  ? HDL 42 (L) 11/21/2021 1122  ? HDL 38 (L) 02/28/2017 1604  ? CHOLHDL 3.5 11/21/2021 1122  ? VLDL 34.0 12/08/2020 1432  ? [place] 83 11/21/2021 1122  ? ? ?CBC ?   ?Component Value Date/Time  ? WBC 10.6 11/21/2021 1122  ? RBC 5.41 (H) 11/21/2021 1122  ? HGB 15.7 (H) 11/21/2021 1122  ? HGB 15.6 03/16/2018 1537  ? HCT 47.6  (H) 11/21/2021 1122  ? HCT 47.5 (H) 03/16/2018 1537  ? PLT 278 11/21/2021 1122  ? PLT 314 03/16/2018 1537  ? MCV 88.0 11/21/2021 1122  ? MCV 88 03/16/2018 1537  ? MCV 88 05/06/2012 0949  ? MCH 29.0 11/21/2021 1122  ?

## 2022-02-21 NOTE — Assessment & Plan Note (Signed)
Lipid profile today ?Encouraged her to consume a low fat diet ?Continue Simvastatin, refilled today ? ?

## 2022-02-21 NOTE — Assessment & Plan Note (Signed)
Advised her to continueCcalcium and Vitamin D ?Encourage daily weightbearing exercise ?

## 2022-02-21 NOTE — Assessment & Plan Note (Signed)
Encourage stress reduction techniques Continue Tylenol OTC as needed 

## 2022-02-21 NOTE — Assessment & Plan Note (Signed)
Encourage good diabetes control ?Continue Gabapentin, refilled today ?

## 2022-02-21 NOTE — Assessment & Plan Note (Signed)
Continue Clonazepam as needed ?

## 2022-02-21 NOTE — Assessment & Plan Note (Signed)
Stable on Citalopram, Buspirone and Clonazepam, refilled today ?Support offered ?

## 2022-02-21 NOTE — Assessment & Plan Note (Signed)
Encourage smoking cessation ?We will check CBC at annual exam ?

## 2022-02-21 NOTE — Assessment & Plan Note (Signed)
A1c today ?Urine microalbumin reviewed ?Encouraged her to consume a low-carb diet and exercise for weight loss ?Continue Glipizide, Lantus and Gabapentin as prescribed ?We will request copy of eye exam ?Encourage routine foot exam ?Encouraged her to get a flu shot in the fall ?Pneumovax UTD ?Encouraged her to get her COVID booster ?

## 2022-02-22 LAB — LIPID PANEL
Cholesterol: 141 mg/dL (ref <200)
HDL: 41 mg/dL — ABNORMAL LOW (ref >=50)
LDL Cholesterol (Calc): 76 mg/dL (calc)
Non-HDL Cholesterol (Calc): 100 mg/dL (calc) (ref <130)
Total CHOL/HDL Ratio: 3.4 (calc) (ref <5.0)
Triglycerides: 140 mg/dL (ref <150)

## 2022-02-22 LAB — HEMOGLOBIN A1C
Hgb A1c MFr Bld: 9.2 % of total Hgb — ABNORMAL HIGH (ref <5.7)
Mean Plasma Glucose: 217 mg/dL
eAG (mmol/L): 12 mmol/L

## 2022-02-22 MED ORDER — ONETOUCH VERIO VI STRP: ORAL_STRIP | 3 refills | Status: DC

## 2022-02-22 MED ORDER — ONETOUCH ULTRASOFT LANCETS MISC: 12 refills | Status: DC

## 2022-04-25 ENCOUNTER — Telehealth: Payer: Medicare Other | Admitting: Physician Assistant

## 2022-04-25 DIAGNOSIS — B9689 Other specified bacterial agents as the cause of diseases classified elsewhere: Secondary | ICD-10-CM

## 2022-04-25 DIAGNOSIS — J208 Acute bronchitis due to other specified organisms: Secondary | ICD-10-CM

## 2022-04-25 MED ORDER — BENZONATATE 100 MG PO CAPS: 100.0000 mg | ORAL_CAPSULE | Freq: Three times a day (TID) | ORAL | 0 refills | Status: DC | PRN

## 2022-04-25 MED ORDER — AZITHROMYCIN 250 MG PO TABS: ORAL_TABLET | ORAL | 0 refills | Status: AC

## 2022-04-25 MED ORDER — PROMETHAZINE-DM 6.25-15 MG/5ML PO SYRP: 5.0000 mL | ORAL_SOLUTION | Freq: Four times a day (QID) | ORAL | 0 refills | Status: DC | PRN

## 2022-04-25 NOTE — Patient Instructions (Signed)
Waymon Budge, thank you for joining Mar Daring, PA-C for today's virtual visit.  While this provider is not your primary care provider (PCP), if your PCP is located in our provider database this encounter information will be shared with them immediately following your visit.  Consent: (Patient) Mindy Gali provided verbal consent for this virtual visit at the beginning of the encounter.  Current Medications:  Current Outpatient Medications:    azithromycin (ZITHROMAX) 250 MG tablet, Take 2 tablets on day 1, then 1 tablet daily on days 2 through 5, Disp: 6 tablet, Rfl: 0   benzonatate (TESSALON) 100 MG capsule, Take 1 capsule (100 mg total) by mouth 3 (three) times daily as needed., Disp: 30 capsule, Rfl: 0   promethazine-dextromethorphan (PROMETHAZINE-DM) 6.25-15 MG/5ML syrup, Take 5 mLs by mouth 4 (four) times daily as needed., Disp: 118 mL, Rfl: 0   busPIRone (BUSPAR) 5 MG tablet, Take 1 tablet (5 mg total) by mouth 2 (two) times daily., Disp: 180 tablet, Rfl: 0   Calcium Carb-Cholecalciferol (CALCIUM 1000 + D PO), Take 1 each by mouth daily., Disp: , Rfl:    citalopram (CELEXA) 20 MG tablet, Take 1 tablet (20 mg total) by mouth daily., Disp: 90 tablet, Rfl: 0   clonazePAM (KLONOPIN) 0.5 MG tablet, Take 0.5-1 tablets (0.25-0.5 mg total) by mouth 2 (two) times daily as needed for anxiety., Disp: 45 tablet, Rfl: 0   gabapentin (NEURONTIN) 300 MG capsule, Take 1 capsule (300 mg total) by mouth 4 (four) times daily., Disp: 360 capsule, Rfl: 0   glipiZIDE (GLUCOTROL) 10 MG tablet, TAKE 1 TABLET BY MOUTH TWICE DAILY BEFORE A MEAL, Disp: 180 tablet, Rfl: 0   glucose blood (ONETOUCH VERIO) test strip, Check blood sugar 6 x daily, Disp: 200 each, Rfl: 3   insulin glargine (LANTUS SOLOSTAR) 100 UNIT/ML Solostar Pen, Inject 50 Units into the skin daily. (Patient taking differently: Inject 30 Units into the skin 2 (two) times daily.), Disp: 45 mL, Rfl: 0   Lancets (ONETOUCH ULTRASOFT)  lancets, Use as instructed, Disp: 100 each, Rfl: 12   lisinopril (ZESTRIL) 5 MG tablet, Take 1 tablet (5 mg total) by mouth daily., Disp: 90 tablet, Rfl: 0   meclizine (ANTIVERT) 25 MG tablet, TAKE 1 TABLET BY MOUTH THREE TIMES DAILY AS NEEDED FOR  DIZZINESS, Disp: 30 tablet, Rfl: 0   Omega-3 Fatty Acids (FISH OIL) 1000 MG CAPS, Take 1 capsule by mouth in the morning and at bedtime., Disp: , Rfl:    simvastatin (ZOCOR) 20 MG tablet, TAKE 1 TABLET BY MOUTH ONCE DAILY AT  6PM, Disp: 90 tablet, Rfl: 0   vitamin C (ASCORBIC ACID) 500 MG tablet, Take 500 mg by mouth daily., Disp: , Rfl:    Medications ordered in this encounter:  Meds ordered this encounter  Medications   azithromycin (ZITHROMAX) 250 MG tablet    Sig: Take 2 tablets on day 1, then 1 tablet daily on days 2 through 5    Dispense:  6 tablet    Refill:  0    Order Specific Question:   Supervising Provider    Answer:   Noemi Chapel [[id number]]   promethazine-dextromethorphan (PROMETHAZINE-DM) 6.25-15 MG/5ML syrup    Sig: Take 5 mLs by mouth 4 (four) times daily as needed.    Dispense:  118 mL    Refill:  0    Order Specific Question:   Supervising Provider    Answer:   MILLER, BRIAN [[id number]]   benzonatate (TESSALON) 100 MG capsule  Sig: Take 1 capsule (100 mg total) by mouth 3 (three) times daily as needed.    Dispense:  30 capsule    Refill:  0    Order Specific Question:   Supervising Provider    Answer:   Sabra Heck, [place]     *If you need refills on other medications prior to your next appointment, please contact your pharmacy*  Follow-Up: Call back or seek an in-person evaluation if the symptoms worsen or if the condition fails to improve as anticipated.  Other Instructions Acute Bronchitis, Adult  Acute bronchitis is when air tubes in the lungs (bronchi) suddenly get swollen. The condition can make it hard for you to breathe. In adults, acute bronchitis usually goes away within 2 weeks. A cough caused by bronchitis  may last up to 3 weeks. Smoking, allergies, and asthma can make the condition worse. What are the causes? Germs that cause cold and flu (viruses). The most common cause of this condition is the virus that causes the common cold. Bacteria. Substances that bother (irritate) the lungs, including: Smoke from cigarettes and other types of tobacco. Dust and pollen. Fumes from chemicals, gases, or burned fuel. Indoor or outdoor air pollution. What increases the risk? A weak body's defense system. This is also called the immune system. Any condition that affects your lungs and breathing, such as asthma. What are the signs or symptoms? A cough. Coughing up clear, yellow, or green mucus. Making high-pitched whistling sounds when you breathe, most often when you breathe out (wheezing). Runny or stuffy nose. Having too much mucus in your lungs (chest congestion). Shortness of breath. Body aches. A sore throat. How is this treated? Acute bronchitis may go away over time without treatment. Your doctor may tell you to: Drink more fluids. This will help thin your mucus so it is easier to cough up. Use a device that gets medicine into your lungs (inhaler). Use a vaporizer or a humidifier. These are machines that add water to the air. This helps with coughing and poor breathing. Take a medicine that thins mucus and helps clear it from your lungs. Take a medicine that prevents or stops coughing. It is not common to take an antibiotic medicine for this condition. Follow these instructions at home:  Take over-the-counter and prescription medicines only as told by your doctor. Use an inhaler, vaporizer, or humidifier as told by your doctor. Take two teaspoons (10 mL) of honey at bedtime. This helps lessen your coughing at night. Drink enough fluid to keep your pee (urine) pale yellow. Do not smoke or use any products that contain nicotine or tobacco. If you need help quitting, ask your doctor. Get a  lot of rest. Return to your normal activities when your doctor says that it is safe. Keep all follow-up visits. How is this prevented?  Wash your hands often with soap and water for at least 20 seconds. If you cannot use soap and water, use hand sanitizer. Avoid contact with people who have cold symptoms. Try not to touch your mouth, nose, or eyes with your hands. Avoid breathing in smoke or chemical fumes. Make sure to get the flu shot every year. Contact a doctor if: Your symptoms do not get better in 2 weeks. You have trouble coughing up the mucus. Your cough keeps you awake at night. You have a fever. Get help right away if: You cough up blood. You have chest pain. You have very bad shortness of breath. You faint or keep  feeling like you are going to faint. You have a very bad headache. Your fever or chills get worse. These symptoms may be an emergency. Get help right away. Call your local emergency services (911 in the [place] Do not wait to see if the symptoms will go away. Do not drive yourself to the hospital. Summary Acute bronchitis is when air tubes in the lungs (bronchi) suddenly get swollen. In adults, acute bronchitis usually goes away within 2 weeks. Drink more fluids. This will help thin your mucus so it is easier to cough up. Take over-the-counter and prescription medicines only as told by your doctor. Contact a doctor if your symptoms do not improve after 2 weeks of treatment. This information is not intended to replace advice given to you by your health care provider. Make sure you discuss any questions you have with your health care provider. Document Revised: 02/21/2021 Document Reviewed: 02/21/2021 Elsevier Patient Education  [employer].    If you have been instructed to have an in-person evaluation today at a local Urgent Care facility, please use the link below. It will take you to a list of all of our available [facility], including  address, phone number and hours of operation. Please do not delay care.  [facility]  If you or a family member do not have a primary care provider, use the link below to schedule a visit and establish care. When you choose a [facility] primary care physician or advanced practice provider, you gain a long-term partner in health. Find a Primary Care Provider  Learn more about [facility]'s in-office and virtual care options: [facility]

## 2022-04-25 NOTE — Progress Notes (Signed)
Virtual Visit Consent   Brittany Pruitt, you are scheduled for a virtual visit with a [facility] provider today. Just as with appointments in the office, your consent must be obtained to participate. Your consent will be active for this visit and any virtual visit you may have with one of our providers in the next 365 days. If you have a MyChart account, a copy of this consent can be sent to you electronically.  As this is a virtual visit, video technology does not allow for your provider to perform a traditional examination. This may limit your provider's ability to fully assess your condition. If your provider identifies any concerns that need to be evaluated in person or the need to arrange testing (such as labs, EKG, etc.), we will make arrangements to do so. Although advances in technology are sophisticated, we cannot ensure that it will always work on either your end or our end. If the connection with a video visit is poor, the visit may have to be switched to a telephone visit. With either a video or telephone visit, we are not always able to ensure that we have a secure connection.  By engaging in this virtual visit, you consent to the provision of healthcare and authorize for your insurance to be billed (if applicable) for the services provided during this visit. Depending on your insurance coverage, you may receive a charge related to this service.  I need to obtain your verbal consent now. Are you willing to proceed with your visit today? Brittany Pruitt has provided verbal consent on 04/25/2022 for a virtual visit (video or telephone). Mar Daring, PA-C  Date: 04/25/2022 9:03 AM  Virtual Visit via Video Note   I, Mar Daring, connected with  Brittany Pruitt  ([id number], 07/13/1961) on 04/25/22 at  9:00 AM EDT by a video-enabled telemedicine application and verified that I am speaking with the correct person using two identifiers.  Location: Patient: Virtual Visit  Location Patient: Home Provider: Virtual Visit Location Provider: Home Office   I discussed the limitations of evaluation and management by telemedicine and the availability of in person appointments. The patient expressed understanding and agreed to proceed.    History of Present Illness: Brittany Pruitt is a 61 y.o. who identifies as a female who was assigned female at birth, and is being seen today for cough, possible bronchitis.  HPI: Cough This is a new problem. The current episode started in the past 7 days (Sunday). The problem has been gradually worsening. The cough is Non-productive. Associated symptoms include chills, headaches, myalgias, nasal congestion, postnasal drip and rhinorrhea. Pertinent negatives include no ear congestion, ear pain, fever, sore throat or sweats. Associated symptoms comments: cough worsened last night, did have hot and cold flashes last night. The symptoms are aggravated by lying down. Treatments tried: alka seltzer cold and flu, tylenol. The treatment provided no relief. Her past medical history is significant for bronchitis. There is no history of asthma.      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    Allergies:  Allergies  Allergen Reactions   Sulfa Antibiotics Hives   Medications:  Current Outpatient Medications:    azithromycin (ZITHROMAX) 250 MG tablet, Take 2 tablets on day 1, then 1 tablet daily on days 2 through 5, Disp: 6 tablet,  Rfl: 0   benzonatate (TESSALON) 100 MG capsule, Take 1 capsule (100 mg total) by mouth 3 (three) times daily as needed., Disp: 30 capsule, Rfl: 0   promethazine-dextromethorphan (PROMETHAZINE-DM) 6.25-15 MG/5ML syrup, Take 5 mLs by mouth 4 (four) times daily as needed., Disp: 118 mL, Rfl: 0   busPIRone (BUSPAR) 5 MG tablet, Take 1 tablet (5 mg total) by mouth 2 (two) times daily., Disp: 180 tablet, Rfl: 0   Calcium Carb-Cholecalciferol (CALCIUM 1000 + D PO), Take 1 each by mouth daily., Disp: , Rfl:    citalopram (CELEXA) 20 MG tablet, Take 1 tablet (20 mg total) by mouth daily., Disp: 90 tablet, Rfl: 0   clonazePAM (KLONOPIN) 0.5 MG tablet, Take 0.5-1 tablets (0.25-0.5 mg total) by mouth 2 (two) times daily as needed for anxiety., Disp: 45 tablet, Rfl: 0   gabapentin (NEURONTIN) 300 MG capsule, Take 1 capsule (300 mg total) by mouth 4 (four) times daily., Disp: 360 capsule, Rfl: 0   glipiZIDE (GLUCOTROL) 10 MG tablet, TAKE 1 TABLET BY MOUTH TWICE DAILY BEFORE A MEAL, Disp: 180 tablet, Rfl: 0   glucose blood (ONETOUCH VERIO) test strip, Check blood sugar 6 x daily, Disp: 200 each, Rfl: 3   insulin glargine (LANTUS SOLOSTAR) 100 UNIT/ML Solostar Pen, Inject 50 Units into the skin daily. (Patient taking differently: Inject 30 Units into the skin 2 (two) times daily.), Disp: 45 mL, Rfl: 0   Lancets (ONETOUCH ULTRASOFT) lancets, Use as instructed, Disp: 100 each, Rfl: 12   lisinopril (ZESTRIL) 5 MG tablet, Take 1 tablet (5 mg total) by mouth daily., Disp: 90 tablet, Rfl: 0   meclizine (ANTIVERT) 25 MG tablet, TAKE 1 TABLET BY MOUTH THREE TIMES DAILY AS NEEDED FOR  DIZZINESS, Disp: 30 tablet, Rfl: 0   Omega-3 Fatty Acids (FISH OIL) 1000 MG CAPS, Take 1 capsule by mouth in the morning and at bedtime., Disp: , Rfl:    simvastatin (ZOCOR) 20 MG tablet, TAKE 1 TABLET BY MOUTH ONCE DAILY AT  6PM, Disp: 90 tablet, Rfl: 0   vitamin C (ASCORBIC ACID) 500 MG tablet, Take 500 mg by mouth daily., Disp: , Rfl:    Observations/Objective: Patient is well-developed, well-nourished in no acute distress.  Resting comfortably at home.  Head is normocephalic, atraumatic.  No labored breathing.  Speech is clear and coherent with logical content.  Patient is alert and oriented at baseline.    Assessment and Plan: 1. Acute bacterial bronchitis - azithromycin (ZITHROMAX) 250 MG tablet; Take 2 tablets on day 1, then 1 tablet daily on days 2 through 5  Dispense: 6 tablet; Refill: 0 - promethazine-dextromethorphan (PROMETHAZINE-DM) 6.25-15 MG/5ML syrup; Take 5 mLs by mouth 4 (four) times daily as needed.  Dispense: 118 mL; Refill: 0 - benzonatate (TESSALON) 100 MG capsule; Take 1 capsule (100 mg total) by mouth 3 (three) times daily as needed.  Dispense: 30 capsule; Refill: 0  - Worsening over a week despite OTC medications - Will treat with Z-pack, Promethazine DM and tessalon perles - Can continue Mucinex  - Push fluids.  - Rest.  - Steam and humidifier can help - Seek in person evaluation if worsening or symptoms fail to  improve    Follow Up Instructions: I discussed the assessment and treatment plan with the patient. The patient was provided an opportunity to ask questions and all were answered. The patient agreed with the plan and demonstrated an understanding of the instructions.  A copy of instructions were sent to the patient via MyChart unless otherwise noted below.    The patient was advised to call back or seek an in-person evaluation if the symptoms worsen or if the condition fails to improve as anticipated.  Time:  I spent 10 minutes with the patient via telehealth technology discussing the above problems/concerns.    Mar Daring, PA-C

## 2022-05-31 ENCOUNTER — Ambulatory Visit (INDEPENDENT_AMBULATORY_CARE_PROVIDER_SITE_OTHER): Payer: Medicare Other | Admitting: Internal Medicine

## 2022-05-31 ENCOUNTER — Encounter: Payer: Self-pay | Admitting: Internal Medicine

## 2022-05-31 VITALS — BP 126/82 | HR 60 | Temp 97.7°F | Ht 64.0 in | Wt 194.0 lb

## 2022-05-31 DIAGNOSIS — E1165 Type 2 diabetes mellitus with hyperglycemia: Secondary | ICD-10-CM | POA: Diagnosis not present

## 2022-05-31 DIAGNOSIS — E66811 Obesity, class 1: Secondary | ICD-10-CM

## 2022-05-31 DIAGNOSIS — R202 Paresthesia of skin: Secondary | ICD-10-CM

## 2022-05-31 DIAGNOSIS — F32A Depression, unspecified: Secondary | ICD-10-CM

## 2022-05-31 DIAGNOSIS — E6609 Other obesity due to excess calories: Secondary | ICD-10-CM

## 2022-05-31 DIAGNOSIS — K219 Gastro-esophageal reflux disease without esophagitis: Secondary | ICD-10-CM | POA: Diagnosis not present

## 2022-05-31 DIAGNOSIS — Z0001 Encounter for general adult medical examination with abnormal findings: Secondary | ICD-10-CM | POA: Diagnosis not present

## 2022-05-31 DIAGNOSIS — Z794 Long term (current) use of insulin: Secondary | ICD-10-CM | POA: Diagnosis not present

## 2022-05-31 DIAGNOSIS — F419 Anxiety disorder, unspecified: Secondary | ICD-10-CM

## 2022-05-31 DIAGNOSIS — Z1231 Encounter for screening mammogram for malignant neoplasm of breast: Secondary | ICD-10-CM | POA: Diagnosis not present

## 2022-05-31 DIAGNOSIS — Z6833 Body mass index (BMI) 33.0-33.9, adult: Secondary | ICD-10-CM

## 2022-05-31 DIAGNOSIS — D692 Other nonthrombocytopenic purpura: Secondary | ICD-10-CM

## 2022-05-31 MED ORDER — OMEPRAZOLE 20 MG PO CPDR: 20.0000 mg | DELAYED_RELEASE_CAPSULE | Freq: Every day | ORAL | 1 refills | Status: DC

## 2022-05-31 MED ORDER — GABAPENTIN 300 MG PO CAPS: 300.0000 mg | ORAL_CAPSULE | Freq: Four times a day (QID) | ORAL | 0 refills | Status: DC

## 2022-05-31 MED ORDER — BUSPIRONE HCL 5 MG PO TABS: 5.0000 mg | ORAL_TABLET | Freq: Two times a day (BID) | ORAL | 0 refills | Status: DC

## 2022-05-31 MED ORDER — CITALOPRAM HYDROBROMIDE 20 MG PO TABS: 20.0000 mg | ORAL_TABLET | Freq: Every day | ORAL | 0 refills | Status: DC

## 2022-05-31 MED ORDER — LISINOPRIL 5 MG PO TABS
5.0000 mg | ORAL_TABLET | Freq: Every day | ORAL | 0 refills | Status: DC
Start: 2022-05-31 — End: 2022-09-06

## 2022-05-31 MED ORDER — GLIPIZIDE 10 MG PO TABS: ORAL_TABLET | ORAL | 0 refills | Status: DC

## 2022-05-31 MED ORDER — SIMVASTATIN 20 MG PO TABS: ORAL_TABLET | ORAL | 0 refills | Status: DC

## 2022-05-31 NOTE — Assessment & Plan Note (Signed)
Encourage diet and exercise for weight loss 

## 2022-05-31 NOTE — Assessment & Plan Note (Signed)
CBC today.  

## 2022-05-31 NOTE — Progress Notes (Signed)
Subjective:    Patient ID: Brittany Pruitt, female    DOB: 1961/08/08, 61 y.o.   MRN: [id number]  HPI  Patient presents to clinic today for her annual exam.  Flu: 08/2021 Tetanus: 06/2017 COVID: Pfizer x2 Pneumovax: 06/2019 Prevnar: 05/2015 Shingrix: Never Pap smear: Hysterectomy Mammogram: 09/2021 Bone density: 09/2021 Colon screening: 02/2016 Vision screening: annually Dentist: as needed  Diet: She does eat some meat. She consumes fruits and veggies. She does eat some fried foods. She drinks mostly water, coffee. Exercise: Walking  Review of Systems     Past Medical History:  Diagnosis Date   Anxiety    Chicken pox    Depression    Diabetes mellitus without complication (HCC)    Frequent headaches    Hyperlipidemia     Current Outpatient Medications  Medication Sig Dispense Refill   benzonatate (TESSALON) 100 MG capsule Take 1 capsule (100 mg total) by mouth 3 (three) times daily as needed. 30 capsule 0   busPIRone (BUSPAR) 5 MG tablet Take 1 tablet (5 mg total) by mouth 2 (two) times daily. 180 tablet 0   Calcium Carb-Cholecalciferol (CALCIUM 1000 + D PO) Take 1 each by mouth daily.     citalopram (CELEXA) 20 MG tablet Take 1 tablet (20 mg total) by mouth daily. 90 tablet 0   clonazePAM (KLONOPIN) 0.5 MG tablet Take 0.5-1 tablets (0.25-0.5 mg total) by mouth 2 (two) times daily as needed for anxiety. 45 tablet 0   gabapentin (NEURONTIN) 300 MG capsule Take 1 capsule (300 mg total) by mouth 4 (four) times daily. 360 capsule 0   glipiZIDE (GLUCOTROL) 10 MG tablet TAKE 1 TABLET BY MOUTH TWICE DAILY BEFORE A MEAL 180 tablet 0   glucose blood (ONETOUCH VERIO) test strip Check blood sugar 6 x daily 200 each 3   insulin glargine (LANTUS SOLOSTAR) 100 UNIT/ML Solostar Pen Inject 50 Units into the skin daily. (Patient taking differently: Inject 30 Units into the skin 2 (two) times daily.) 45 mL 0   Lancets (ONETOUCH ULTRASOFT) lancets Use as instructed 100 each 12    lisinopril (ZESTRIL) 5 MG tablet Take 1 tablet (5 mg total) by mouth daily. 90 tablet 0   meclizine (ANTIVERT) 25 MG tablet TAKE 1 TABLET BY MOUTH THREE TIMES DAILY AS NEEDED FOR  DIZZINESS 30 tablet 0   Omega-3 Fatty Acids (FISH OIL) 1000 MG CAPS Take 1 capsule by mouth in the morning and at bedtime.     promethazine-dextromethorphan (PROMETHAZINE-DM) 6.25-15 MG/5ML syrup Take 5 mLs by mouth 4 (four) times daily as needed. 118 mL 0   simvastatin (ZOCOR) 20 MG tablet TAKE 1 TABLET BY MOUTH ONCE DAILY AT  6PM 90 tablet 0   vitamin C (ASCORBIC ACID) 500 MG tablet Take 500 mg by mouth daily.     No current facility-administered medications for this visit.    Allergies  Allergen Reactions   Sulfa Antibiotics Hives    Family History  Problem Relation Age of Onset   Uterine cancer Mother    Breast cancer Mother 55   Pancreatic cancer Mother    Heart disease Father    Hypertension Father    Anxiety disorder Father    Diabetes Father    Uterine cancer Maternal Aunt    Breast cancer Maternal Aunt 14   Heart disease Paternal Grandfather     Social History   Socioeconomic History   Marital status: Married    Spouse name: Nykole Matos   Number of children: Not  on file   Years of education: Not on file   Highest education level: Not on file  Occupational History   Not on file  Tobacco Use   Smoking status: Every Day    Packs/day: 1.00    Years: 30.00    Total pack years: 30.00    Types: Cigarettes   Smokeless tobacco: Never  Vaping Use   Vaping Use: Never used  Substance and Sexual Activity   Alcohol use: No    Alcohol/week: 0.0 standard drinks of alcohol   Drug use: Never   Sexual activity: Yes    Partners: Male  Other Topics Concern   Not on file  Social History Narrative   Pt lives with husband Legrand Como.  Is not currently working.    Social Determinants of Health   Financial Resource Strain: Not on file  Food Insecurity: Not on file  Transportation Needs: Not  on file  Physical Activity: Not on file  Stress: Not on file  Social Connections: Not on file  Intimate Partner Violence: Not on file     Constitutional: Patient reports intermittent headaches.  Denies fever, malaise, fatigue, or abrupt weight changes.  HEENT: Denies eye pain, eye redness, ear pain, ringing in the ears, wax buildup, runny nose, nasal congestion, bloody nose, or sore throat. Respiratory: Denies difficulty breathing, shortness of breath, cough or sputum production.   Cardiovascular: Denies chest pain, chest tightness, palpitations or swelling in the hands or feet.  Gastrointestinal: Pt reports intermittent epigastric pain. Denies bloating, constipation, diarrhea or blood in the stool.  GU: Denies urgency, frequency, pain with urination, burning sensation, blood in urine, odor or discharge. Musculoskeletal: Denies decrease in range of motion, difficulty with gait, muscle pain or joint pain and swelling.  Skin: Denies redness, rashes, lesions or ulcercations.  Neurological: Patient reports neuropathic pain, difficulty with speech.  Denies dizziness, difficulty with memory, or problems with balance and coordination.  Psych: Patient has a history of anxiety and depression.  Denies SI/HI.  No other specific complaints in a complete review of systems (except as listed in HPI above).  Objective:   Physical Exam  BP 126/82 (BP Location: Left Arm, Patient Position: Sitting, Cuff Size: Normal)   Pulse 60   Temp 97.7 F (36.5 C) (Temporal)   Ht $R'5\' 4"'FF$  (1.626 m)   Wt 194 lb (88 kg)   SpO2 96%   BMI 33.30 kg/m   Wt Readings from Last 3 Encounters:  02/21/22 186 lb (84.4 kg)  11/21/21 183 lb (83 kg)  08/21/21 172 lb 12.8 oz (78.4 kg)    General: Appears her stated age, obese, in NAD. Skin: Warm, dry and intact. No ulcerations noted. HEENT: Head: normal shape and size; Eyes: sclera white, no icterus, conjunctiva pink, PERRLA and EOMs intact;  Neck:  Neck supple, trachea  midline. No masses, lumps or thyromegaly present.  Cardiovascular: Normal rate and rhythm. S1,S2 noted.  No murmur, rubs or gallops noted. No JVD or BLE edema. No carotid bruits noted. Pulmonary/Chest: Normal effort and positive vesicular breath sounds. No respiratory distress. No wheezes, rales or ronchi noted.  Abdomen: Soft and nontender. Normal bowel sounds. No distention or masses noted.  Musculoskeletal: Strength 5/5 BUE/BLE. No difficulty with gait.  Neurological: Alert and oriented. Cranial nerves II-XII grossly intact. Coordination normal.  Psychiatric: Mood and affect normal. Behavior is normal. Judgment and thought content normal.   BMET    Component Value Date/Time   NA 138 11/21/2021 1122   NA 141 03/16/2018  1537   NA 137 05/06/2012 0949   K 4.9 11/21/2021 1122   K 4.5 05/06/2012 0949   CL 102 11/21/2021 1122   CL 103 05/06/2012 0949   CO2 31 11/21/2021 1122   CO2 27 05/06/2012 0949   GLUCOSE 126 (H) 11/21/2021 1122   GLUCOSE 123 (H) 05/06/2012 0949   BUN 10 11/21/2021 1122   BUN 13 03/16/2018 1537   BUN 10 05/06/2012 0949   CREATININE 0.67 11/21/2021 1122   CALCIUM 9.6 11/21/2021 1122   CALCIUM 9.2 05/06/2012 0949   GFRNONAA 90 03/16/2018 1537   GFRNONAA >60 05/06/2012 0949   GFRAA 103 03/16/2018 1537   GFRAA >60 05/06/2012 0949    Lipid Panel     Component Value Date/Time   CHOL 141 02/21/2022 1347   CHOL 135 02/28/2017 1604   TRIG 140 02/21/2022 1347   HDL 41 (L) 02/21/2022 1347   HDL 38 (L) 02/28/2017 1604   CHOLHDL 3.4 02/21/2022 1347   VLDL 34.0 12/08/2020 1432   LDLCALC 76 02/21/2022 1347    CBC    Component Value Date/Time   WBC 10.6 11/21/2021 1122   RBC 5.41 (H) 11/21/2021 1122   HGB 15.7 (H) 11/21/2021 1122   HGB 15.6 03/16/2018 1537   HCT 47.6 (H) 11/21/2021 1122   HCT 47.5 (H) 03/16/2018 1537   PLT 278 11/21/2021 1122   PLT 314 03/16/2018 1537   MCV 88.0 11/21/2021 1122   MCV 88 03/16/2018 1537   MCV 88 05/06/2012 0949   MCH 29.0  11/21/2021 1122   MCHC 33.0 11/21/2021 1122   RDW 12.6 11/21/2021 1122   RDW 14.1 03/16/2018 1537   RDW 13.7 05/06/2012 0949   LYMPHSABS 5.1 (H) 02/28/2017 1604   EOSABS 0.1 02/28/2017 1604   BASOSABS 0.1 02/28/2017 1604    Hgb A1C Lab Results  Component Value Date   HGBA1C 9.2 (H) 02/21/2022           Assessment & Plan:   Preventative Health Maintenance:  Encouraged her to get a flu shot in the fall Tetanus UTD Encouraged her to get a COVID booster Pneumovax UTD She will not need a Prevnar until she is 63 Discussed Shingrix vaccine, she will check coverage with her insurance company and schedule a nurse visit if she would like to have this done She no longer needs Pap smears Mammogram ordered-she will call to schedule Bone density UTD Colon screening UTD Encouraged her to consume a balanced diet and exercise regimen Advised her to see an eye doctor and dentist annually We will check CBC, c-Met, lipid, A1c today  RTC in 3 months, follow-up chronic conditions Webb Silversmith, NP

## 2022-05-31 NOTE — Patient Instructions (Signed)
Health Maintenance for Postmenopausal Women Menopause is a normal process in which your ability to get pregnant comes to an end. This process happens slowly over many months or years, usually between the ages of 48 and 55. Menopause is complete when you have missed your menstrual period for 12 months. It is important to talk with your health care provider about some of the most common conditions that affect women after menopause (postmenopausal women). These include heart disease, cancer, and bone loss (osteoporosis). Adopting a healthy lifestyle and getting preventive care can help to promote your health and wellness. The actions you take can also lower your chances of developing some of these common conditions. What are the signs and symptoms of menopause? During menopause, you may have the following symptoms: Hot flashes. These can be moderate or severe. Night sweats. Decrease in sex drive. Mood swings. Headaches. Tiredness (fatigue). Irritability. Memory problems. Problems falling asleep or staying asleep. Talk with your health care provider about treatment options for your symptoms. Do I need hormone replacement therapy? Hormone replacement therapy is effective in treating symptoms that are caused by menopause, such as hot flashes and night sweats. Hormone replacement carries certain risks, especially as you become older. If you are thinking about using estrogen or estrogen with progestin, discuss the benefits and risks with your health care provider. How can I reduce my risk for heart disease and stroke? The risk of heart disease, heart attack, and stroke increases as you age. One of the causes may be a change in the body's hormones during menopause. This can affect how your body uses dietary fats, triglycerides, and cholesterol. Heart attack and stroke are medical emergencies. There are many things that you can do to help prevent heart disease and stroke. Watch your blood pressure High  blood pressure causes heart disease and increases the risk of stroke. This is more likely to develop in people who have high blood pressure readings or are overweight. Have your blood pressure checked: Every 3-5 years if you are 18-39 years of age. Every year if you are 40 years old or older. Eat a healthy diet  Eat a diet that includes plenty of vegetables, fruits, low-fat dairy products, and lean protein. Do not eat a lot of foods that are high in solid fats, added sugars, or sodium. Get regular exercise Get regular exercise. This is one of the most important things you can do for your health. Most adults should: Try to exercise for at least 150 minutes each week. The exercise should increase your heart rate and make you sweat (moderate-intensity exercise). Try to do strengthening exercises at least twice each week. Do these in addition to the moderate-intensity exercise. Spend less time sitting. Even light physical activity can be beneficial. Other tips Work with your health care provider to achieve or maintain a healthy weight. Do not use any products that contain nicotine or tobacco. These products include cigarettes, chewing tobacco, and vaping devices, such as e-cigarettes. If you need help quitting, ask your health care provider. Know your numbers. Ask your health care provider to check your cholesterol and your blood sugar (glucose). Continue to have your blood tested as directed by your health care provider. Do I need screening for cancer? Depending on your health history and family history, you may need to have cancer screenings at different stages of your life. This may include screening for: Breast cancer. Cervical cancer. Lung cancer. Colorectal cancer. What is my risk for osteoporosis? After menopause, you may be   at increased risk for osteoporosis. Osteoporosis is a condition in which bone destruction happens more quickly than new bone creation. To help prevent osteoporosis or  the bone fractures that can happen because of osteoporosis, you may take the following actions: If you are 19-50 years old, get at least 1,000 mg of calcium and at least 600 international units (IU) of vitamin D per day. If you are older than age 50 but younger than age 70, get at least 1,200 mg of calcium and at least 600 international units (IU) of vitamin D per day. If you are older than age 70, get at least 1,200 mg of calcium and at least 800 international units (IU) of vitamin D per day. Smoking and drinking excessive alcohol increase the risk of osteoporosis. Eat foods that are rich in calcium and vitamin D, and do weight-bearing exercises several times each week as directed by your health care provider. How does menopause affect my mental health? Depression may occur at any age, but it is more common as you become older. Common symptoms of depression include: Feeling depressed. Changes in sleep patterns. Changes in appetite or eating patterns. Feeling an overall lack of motivation or enjoyment of activities that you previously enjoyed. Frequent crying spells. Talk with your health care provider if you think that you are experiencing any of these symptoms. General instructions See your health care provider for regular wellness exams and vaccines. This may include: Scheduling regular health, dental, and eye exams. Getting and maintaining your vaccines. These include: Influenza vaccine. Get this vaccine each year before the flu season begins. Pneumonia vaccine. Shingles vaccine. Tetanus, diphtheria, and pertussis (Tdap) booster vaccine. Your health care provider may also recommend other immunizations. Tell your health care provider if you have ever been abused or do not feel safe at home. Summary Menopause is a normal process in which your ability to get pregnant comes to an end. This condition causes hot flashes, night sweats, decreased interest in sex, mood swings, headaches, or lack  of sleep. Treatment for this condition may include hormone replacement therapy. Take actions to keep yourself healthy, including exercising regularly, eating a healthy diet, watching your weight, and checking your blood pressure and blood sugar levels. Get screened for cancer and depression. Make sure that you are up to date with all your vaccines. This information is not intended to replace advice given to you by your health care provider. Make sure you discuss any questions you have with your health care provider. Document Revised: 03/12/2021 Document Reviewed: 03/12/2021 Elsevier Patient Education  2023 [employer]  

## 2022-05-31 NOTE — Assessment & Plan Note (Signed)
We will trial omeprazole

## 2022-06-01 LAB — COMPLETE METABOLIC PANEL WITH GFR
AG Ratio: 1.6 (calc) (ref 1.0–2.5)
ALT: 12 U/L (ref 6–29)
AST: 13 U/L (ref 10–35)
Albumin: 4.1 g/dL (ref 3.6–5.1)
Alkaline phosphatase (APISO): 47 U/L (ref 37–153)
BUN: 7 mg/dL (ref 7–25)
CO2: 26 mmol/L (ref 20–32)
Calcium: 9.4 mg/dL (ref 8.6–10.4)
Chloride: 107 mmol/L (ref 98–110)
Creat: 0.68 mg/dL (ref 0.50–1.05)
Globulin: 2.5 g/dL (calc) (ref 1.9–3.7)
Glucose, Bld: 44 mg/dL — ABNORMAL LOW (ref 65–99)
Potassium: 4.2 mmol/L (ref 3.5–5.3)
Sodium: 141 mmol/L (ref 135–146)
Total Bilirubin: 0.6 mg/dL (ref 0.2–1.2)
Total Protein: 6.6 g/dL (ref 6.1–8.1)
eGFR: 98 mL/min/{1.73_m2} (ref >=60)

## 2022-06-01 LAB — CBC
HCT: 45.7 % — ABNORMAL HIGH (ref 35.0–45.0)
Hemoglobin: 14.9 g/dL (ref 11.7–15.5)
MCH: 29.1 pg (ref 27.0–33.0)
MCHC: 32.6 g/dL (ref 32.0–36.0)
MCV: 89.3 fL (ref 80.0–100.0)
MPV: 11.5 fL (ref 7.5–12.5)
Platelets: 255 10*3/uL (ref 140–400)
RBC: 5.12 10*6/uL — ABNORMAL HIGH (ref 3.80–5.10)
RDW: 13.4 % (ref 11.0–15.0)
WBC: 10.4 10*3/uL (ref 3.8–10.8)

## 2022-06-01 LAB — LIPID PANEL
Cholesterol: 138 mg/dL (ref <200)
HDL: 42 mg/dL — ABNORMAL LOW (ref >=50)
LDL Cholesterol (Calc): 73 mg/dL (calc)
Non-HDL Cholesterol (Calc): 96 mg/dL (calc) (ref <130)
Total CHOL/HDL Ratio: 3.3 (calc) (ref <5.0)
Triglycerides: 157 mg/dL — ABNORMAL HIGH (ref <150)

## 2022-06-01 LAB — HEMOGLOBIN A1C
Hgb A1c MFr Bld: 8.7 % of total Hgb — ABNORMAL HIGH (ref <5.7)
Mean Plasma Glucose: 203 mg/dL
eAG (mmol/L): 11.2 mmol/L

## 2022-06-06 ENCOUNTER — Encounter: Payer: Self-pay | Admitting: Internal Medicine

## 2022-06-07 MED ORDER — CLONAZEPAM 0.5 MG PO TABS: 0.2500 mg | ORAL_TABLET | Freq: Two times a day (BID) | ORAL | 0 refills | Status: DC | PRN

## 2022-06-10 MED ORDER — LANTUS SOLOSTAR 100 UNIT/ML ~~LOC~~ SOPN: 35.0000 [IU] | PEN_INJECTOR | Freq: Two times a day (BID) | SUBCUTANEOUS | 0 refills | Status: DC

## 2022-06-10 NOTE — Addendum Note (Signed)
Addended by: [name] on: 06/10/2022 11:21 AM   Modules accepted: Orders

## 2022-07-18 ENCOUNTER — Other Ambulatory Visit: Payer: Self-pay | Admitting: Internal Medicine

## 2022-07-19 NOTE — Telephone Encounter (Signed)
Requested medication (s) are due for refill today:   Provider to review both  Requested medication (s) are on the active medication list:   Yes for both  Future visit scheduled:   No    Seen a month ago   Last ordered: Antivert 02/21/2022 #30, 0 refills;   Klonopin 06/07/2022 #45, 0 refills  Returned because both are non delegated refills    Requested Prescriptions  Pending Prescriptions Disp Refills   meclizine (ANTIVERT) 25 MG tablet [Pharmacy Med Name: MECLIZINE TABS '25MG'$ ] 30 tablet 35    Sig: TAKE 1 TABLET THREE TIMES A DAY AS NEEDED FOR DIZZINESS     Not Delegated - Gastroenterology: Antiemetics Failed - 07/18/2022  7:10 AM      Failed - This refill cannot be delegated      Passed - Valid encounter within last 6 months    Recent Outpatient Visits           1 month ago Encounter for general adult medical examination with abnormal findings   [facility] [place], [name], NP   4 months ago Type 2 diabetes mellitus with hyperglycemia, with long-term current use of insulin ([place])   [facility] [place], [place] W, NP   8 months ago Type 2 diabetes mellitus with hyperglycemia, with long-term current use of insulin [facility])   [facility], [name], NP   11 months ago [employer] annual wellness visit, initial   [place], [name], NP               clonazePAM (KLONOPIN) 0.5 MG tablet [Pharmacy Med Name: CLONAZEPAM TABS 0.'5MG'$ ] 45 tablet 0    Sig: TAKE ONE-HALF (1/2) TO ONE TABLET TWICE A DAY AS NEEDED FOR ANXIETY     Not Delegated - Psychiatry: Anxiolytics/Hypnotics 2 Failed - 07/18/2022  7:10 AM      Failed - This refill cannot be delegated      Failed - Urine Drug Screen completed in last 360 days      Passed - Patient is not pregnant      Passed - Valid encounter within last 6 months    Recent Outpatient Visits           1 month ago Encounter for general adult medical examination with abnormal findings    [facility] [place], [name], NP   4 months ago Type 2 diabetes mellitus with hyperglycemia, with long-term current use of insulin [facility])   [facility] [place], [name], NP   8 months ago Type 2 diabetes mellitus with hyperglycemia, with long-term current use of insulin [facility])   [facility] [place], [name], NP   11 months ago [employer] annual wellness visit, initial   [facility] [place], [name], [place]

## 2022-09-06 ENCOUNTER — Ambulatory Visit (INDEPENDENT_AMBULATORY_CARE_PROVIDER_SITE_OTHER): Payer: Medicare Other | Admitting: Internal Medicine

## 2022-09-06 ENCOUNTER — Encounter: Payer: Self-pay | Admitting: Internal Medicine

## 2022-09-06 VITALS — BP 124/84 | HR 64 | Temp 96.9°F | Wt 199.0 lb

## 2022-09-06 DIAGNOSIS — Z6834 Body mass index (BMI) 34.0-34.9, adult: Secondary | ICD-10-CM

## 2022-09-06 DIAGNOSIS — K219 Gastro-esophageal reflux disease without esophagitis: Secondary | ICD-10-CM

## 2022-09-06 DIAGNOSIS — R202 Paresthesia of skin: Secondary | ICD-10-CM | POA: Diagnosis not present

## 2022-09-06 DIAGNOSIS — F419 Anxiety disorder, unspecified: Secondary | ICD-10-CM | POA: Diagnosis not present

## 2022-09-06 DIAGNOSIS — D692 Other nonthrombocytopenic purpura: Secondary | ICD-10-CM

## 2022-09-06 DIAGNOSIS — G63 Polyneuropathy in diseases classified elsewhere: Secondary | ICD-10-CM

## 2022-09-06 DIAGNOSIS — J383 Other diseases of vocal cords: Secondary | ICD-10-CM

## 2022-09-06 DIAGNOSIS — M8589 Other specified disorders of bone density and structure, multiple sites: Secondary | ICD-10-CM

## 2022-09-06 DIAGNOSIS — Z794 Long term (current) use of insulin: Secondary | ICD-10-CM

## 2022-09-06 DIAGNOSIS — Z79899 Other long term (current) drug therapy: Secondary | ICD-10-CM

## 2022-09-06 DIAGNOSIS — E6609 Other obesity due to excess calories: Secondary | ICD-10-CM

## 2022-09-06 DIAGNOSIS — E66811 Obesity, class 1: Secondary | ICD-10-CM

## 2022-09-06 DIAGNOSIS — H8103 Meniere's disease, bilateral: Secondary | ICD-10-CM

## 2022-09-06 DIAGNOSIS — D751 Secondary polycythemia: Secondary | ICD-10-CM

## 2022-09-06 DIAGNOSIS — R519 Headache, unspecified: Secondary | ICD-10-CM

## 2022-09-06 DIAGNOSIS — I10 Essential (primary) hypertension: Secondary | ICD-10-CM

## 2022-09-06 DIAGNOSIS — E349 Endocrine disorder, unspecified: Secondary | ICD-10-CM

## 2022-09-06 DIAGNOSIS — Z23 Encounter for immunization: Secondary | ICD-10-CM

## 2022-09-06 DIAGNOSIS — E7800 Pure hypercholesterolemia, unspecified: Secondary | ICD-10-CM

## 2022-09-06 DIAGNOSIS — F32A Depression, unspecified: Secondary | ICD-10-CM

## 2022-09-06 DIAGNOSIS — E1165 Type 2 diabetes mellitus with hyperglycemia: Secondary | ICD-10-CM | POA: Diagnosis not present

## 2022-09-06 LAB — POCT GLYCOSYLATED HEMOGLOBIN (HGB A1C): Hemoglobin A1C: 8.8 % — AB (ref 4.0–5.6)

## 2022-09-06 MED ORDER — BUSPIRONE HCL 5 MG PO TABS: 5.0000 mg | ORAL_TABLET | Freq: Two times a day (BID) | ORAL | 1 refills | Status: DC

## 2022-09-06 MED ORDER — LISINOPRIL 5 MG PO TABS: 5.0000 mg | ORAL_TABLET | Freq: Every day | ORAL | 1 refills | Status: DC

## 2022-09-06 MED ORDER — TIRZEPATIDE 2.5 MG/0.5ML ~~LOC~~ SOAJ: 2.5000 mg | SUBCUTANEOUS | 0 refills | Status: DC

## 2022-09-06 MED ORDER — CITALOPRAM HYDROBROMIDE 20 MG PO TABS: 20.0000 mg | ORAL_TABLET | Freq: Every day | ORAL | 1 refills | Status: DC

## 2022-09-06 MED ORDER — MECLIZINE HCL 25 MG PO TABS: ORAL_TABLET | ORAL | 0 refills | Status: DC

## 2022-09-06 MED ORDER — SIMVASTATIN 20 MG PO TABS: ORAL_TABLET | ORAL | 1 refills | Status: DC

## 2022-09-06 MED ORDER — GABAPENTIN 300 MG PO CAPS: 300.0000 mg | ORAL_CAPSULE | Freq: Four times a day (QID) | ORAL | 1 refills | Status: DC

## 2022-09-06 MED ORDER — GLIPIZIDE 10 MG PO TABS: ORAL_TABLET | ORAL | 1 refills | Status: DC

## 2022-09-06 MED ORDER — OMEPRAZOLE 20 MG PO CPDR: 20.0000 mg | DELAYED_RELEASE_CAPSULE | Freq: Every day | ORAL | 1 refills | Status: DC

## 2022-09-06 NOTE — Assessment & Plan Note (Signed)
Encourage stress reduction techniques Continue Tylenol as needed 

## 2022-09-06 NOTE — Assessment & Plan Note (Signed)
C-Met and lipid profile today Encouraged to consume low-fat diet Continue simvastatin

## 2022-09-06 NOTE — Assessment & Plan Note (Signed)
CBC today.  

## 2022-09-06 NOTE — Assessment & Plan Note (Signed)
Continue citalopram, buspirone and clonazepam CSA and UDS today Support offered

## 2022-09-06 NOTE — Assessment & Plan Note (Signed)
CBC today Encourage smoking cessation 

## 2022-09-06 NOTE — Assessment & Plan Note (Signed)
Controlled on lisinopril Reinforced DASH diet and exercise for weight loss C-Met today

## 2022-09-06 NOTE — Assessment & Plan Note (Signed)
Continue calcium and vitamin D OTC Encourage daily weightbearing exercise

## 2022-09-06 NOTE — Progress Notes (Signed)
Subjective:    Patient ID: Brittany Pruitt, female    DOB: 09/18/1961, 61 y.o.   MRN: [id number]  HPI  Patient presents to clinic today for 75-monthfollow-up of chronic conditions.  Anxiety and Depression: Chronic, managed on Citalopram, Buspirone and Clonazepam.  She is not currently seeing a therapist.  She denies SI/HI.  DM2 with Peripheral Neuropathy: Her last A1c was 8.7%, 05/2022.  She is taking Glipizide, Lantus and Gabapentin as prescribed.  Her sugars range 66-215.  She checks her feet routinely.  Her last eye exam was 11/2021.  Flu 08/2021.  Pneumovax 06/2019.  CArboriculturist  HTN: Her BP today is 124/84.  She is taking Lisinopril as prescribed.  There is no ECG on file.  HLD: Her last LDL was 73, triglycerides 157, 05/2022.  She denies myalgias on Simvastatin.  She tries to consume a low-fat diet.  Frequent Headaches: Triggered by stress.  She takes Tylenol as needed with good relief of symptoms.  She does not follow with neurology.  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  GERD: She is not sure what triggers this.  She denies breakthrough on Omeprazole.  There is no upper GI on file.  Mnire's disease: Currently asymptomatic.  She take Meclizine as needed.  She does not follow with ENT.  Polycythemia: Her last H/H was 14.9/45.7, 05/2022.  She does smoke.  She does not follow with hematology.  Osteopenia: She is taking Calcium and Vitamin D OTC.  She tries to get weightbearing exercise daily.  Bone density from 09/2021 reviewed.  Review of Systems     Past Medical History:  Diagnosis Date   Anxiety    Chicken pox    Depression    Diabetes mellitus without complication (HCC)    Frequent headaches    Hyperlipidemia     Current Outpatient Medications  Medication Sig Dispense Refill   busPIRone (BUSPAR) 5 MG tablet Take 1 tablet (5 mg total) by mouth 2 (two) times daily. 180 tablet 0   Calcium Carb-Cholecalciferol (CALCIUM 1000 + D PO) Take 1 each by  mouth daily.     citalopram (CELEXA) 20 MG tablet Take 1 tablet (20 mg total) by mouth daily. 90 tablet 0   clonazePAM (KLONOPIN) 0.5 MG tablet TAKE ONE-HALF (1/2) TO ONE TABLET TWICE A DAY AS NEEDED FOR ANXIETY 45 tablet 0   gabapentin (NEURONTIN) 300 MG capsule Take 1 capsule (300 mg total) by mouth 4 (four) times daily. 360 capsule 0   glipiZIDE (GLUCOTROL) 10 MG tablet TAKE 1 TABLET BY MOUTH TWICE DAILY BEFORE A MEAL 180 tablet 0   glucose blood (ONETOUCH VERIO) test strip Check blood sugar 6 x daily 200 each 3   insulin glargine (LANTUS SOLOSTAR) 100 UNIT/ML Solostar Pen Inject 35 Units into the skin 2 (two) times daily. 63 mL 0   Lancets (ONETOUCH ULTRASOFT) lancets Use as instructed 100 each 12   lisinopril (ZESTRIL) 5 MG tablet Take 1 tablet (5 mg total) by mouth daily. 90 tablet 0   meclizine (ANTIVERT) 25 MG tablet TAKE 1 TABLET THREE TIMES A DAY AS NEEDED FOR DIZZINESS 30 tablet 0   Omega-3 Fatty Acids (FISH OIL) 1000 MG CAPS Take 1 capsule by mouth in the morning and at bedtime.     omeprazole (PRILOSEC) 20 MG capsule Take 1 capsule (20 mg total) by mouth daily. 90 capsule 1   simvastatin (ZOCOR) 20 MG tablet TAKE 1 TABLET BY MOUTH ONCE DAILY AT  6PM 90 tablet  0   vitamin C (ASCORBIC ACID) 500 MG tablet Take 500 mg by mouth daily.     No current facility-administered medications for this visit.    Allergies  Allergen Reactions   Sulfa Antibiotics Hives    Family History  Problem Relation Age of Onset   Uterine cancer Mother    Breast cancer Mother 15   Pancreatic cancer Mother    Heart disease Father    Hypertension Father    Anxiety disorder Father    Diabetes Father    Uterine cancer Maternal Aunt    Breast cancer Maternal Aunt 35   Heart disease Paternal Grandfather     Social History   Socioeconomic History   Marital status: Married    Spouse name: Labria Wos   Number of children: Not on file   Years of education: Not on file   Highest education  level: Not on file  Occupational History   Not on file  Tobacco Use   Smoking status: Every Day    Packs/day: 1.00    Years: 30.00    Total pack years: 30.00    Types: Cigarettes   Smokeless tobacco: Never  Vaping Use   Vaping Use: Never used  Substance and Sexual Activity   Alcohol use: No    Alcohol/week: 0.0 standard drinks of alcohol   Drug use: Never   Sexual activity: Yes    Partners: Male  Other Topics Concern   Not on file  Social History Narrative   Pt lives with husband Legrand Como.  Is not currently working.    Social Determinants of Health   Financial Resource Strain: Not on file  Food Insecurity: Not on file  Transportation Needs: Not on file  Physical Activity: Not on file  Stress: Not on file  Social Connections: Not on file  Intimate Partner Violence: Not on file     Constitutional: Patient reports intermittent headaches.  Denies fever, malaise, fatigue, or abrupt weight changes.  HEENT: Denies eye pain, eye redness, ear pain, ringing in the ears, wax buildup, runny nose, nasal congestion, bloody nose, or sore throat. Respiratory: Denies difficulty breathing, shortness of breath, cough or sputum production.   Cardiovascular: Denies chest pain, chest tightness, palpitations or swelling in the hands or feet.  Gastrointestinal: Denies abdominal pain, bloating, constipation, diarrhea or blood in the stool.  GU: Denies urgency, frequency, pain with urination, burning sensation, blood in urine, odor or discharge. Musculoskeletal: Denies decrease in range of motion, difficulty with gait, muscle pain or joint pain and swelling.  Skin: Denies redness, rashes, lesions or ulcercations.  Neurological: Patient reports difficulty with speech, neuropathic pain.  Denies dizziness, difficulty with memory, or problems with balance and coordination.  Psych: Patient has a history of anxiety and depression.  Denies SI/HI.  No other specific complaints in a complete review of  systems (except as listed in HPI above).  Objective:   Physical Exam  Blood pressure 124/84, pulse 64, temperature (!) 96.9 F (36.1 C), temperature source Temporal, weight 199 lb (90.3 kg), SpO2 97 %.  Wt Readings from Last 3 Encounters:  05/31/22 194 lb (88 kg)  02/21/22 186 lb (84.4 kg)  11/21/21 183 lb (83 kg)    General: Appears her stated age, obese, in NAD. Skin: Warm, dry and intact. No ulcerations noted. HEENT: Head: normal shape and size; Eyes: sclera white, no icterus, conjunctiva pink, PERRLA and EOMs intact;  Cardiovascular: Normal rate and rhythm. S1,S2 noted.  No murmur, rubs or gallops noted.  No JVD or BLE edema. No carotid bruits noted. Pulmonary/Chest: Normal effort and positive vesicular breath sounds. No respiratory distress. No wheezes, rales or ronchi noted.  Abdomen: Normal bowel sounds.  Musculoskeletal: No difficulty with gait.  Neurological: Alert and oriented. Cranial nerves II-XII grossly intact. Coordination normal.  Psychiatric: Mood and affect mildly flat. Behavior is normal. Judgment and thought content normal.   BMET    Component Value Date/Time   NA 141 05/31/2022 1322   NA 141 03/16/2018 1537   NA 137 05/06/2012 0949   K 4.2 05/31/2022 1322   K 4.5 05/06/2012 0949   CL 107 05/31/2022 1322   CL 103 05/06/2012 0949   CO2 26 05/31/2022 1322   CO2 27 05/06/2012 0949   GLUCOSE 44 (L) 05/31/2022 1322   GLUCOSE 123 (H) 05/06/2012 0949   BUN 7 05/31/2022 1322   BUN 13 03/16/2018 1537   BUN 10 05/06/2012 0949   CREATININE 0.68 05/31/2022 1322   CALCIUM 9.4 05/31/2022 1322   CALCIUM 9.2 05/06/2012 0949   GFRNONAA 90 03/16/2018 1537   GFRNONAA >60 05/06/2012 0949   GFRAA 103 03/16/2018 1537   GFRAA >60 05/06/2012 0949    Lipid Panel     Component Value Date/Time   CHOL 138 05/31/2022 1322   CHOL 135 02/28/2017 1604   TRIG 157 (H) 05/31/2022 1322   HDL 42 (L) 05/31/2022 1322   HDL 38 (L) 02/28/2017 1604   CHOLHDL 3.3 05/31/2022 1322    VLDL 34.0 12/08/2020 1432   LDLCALC 73 05/31/2022 1322    CBC    Component Value Date/Time   WBC 10.4 05/31/2022 1322   RBC 5.12 (H) 05/31/2022 1322   HGB 14.9 05/31/2022 1322   HGB 15.6 03/16/2018 1537   HCT 45.7 (H) 05/31/2022 1322   HCT 47.5 (H) 03/16/2018 1537   PLT 255 05/31/2022 1322   PLT 314 03/16/2018 1537   MCV 89.3 05/31/2022 1322   MCV 88 03/16/2018 1537   MCV 88 05/06/2012 0949   MCH 29.1 05/31/2022 1322   MCHC 32.6 05/31/2022 1322   RDW 13.4 05/31/2022 1322   RDW 14.1 03/16/2018 1537   RDW 13.7 05/06/2012 0949   LYMPHSABS 5.1 (H) 02/28/2017 1604   EOSABS 0.1 02/28/2017 1604   BASOSABS 0.1 02/28/2017 1604    Hgb A1C Lab Results  Component Value Date   HGBA1C 8.7 (H) 05/31/2022           Assessment & Plan:     RTC in 3 months, follow-up chronic conditions Webb Silversmith, NP

## 2022-09-06 NOTE — Assessment & Plan Note (Signed)
POCT A1c 8.8% Urine micro albumin has been checked within the last year Encouraged her to consume a low-carb diet and exercise for weight loss Continue glipizide and Lantus We will trial Mounjaro 2.5 mg daily Encourage routine eye exam Encourage routine foot exam Flu shot today Pneumovax UTD Encouraged her to get her COVID-vaccine

## 2022-09-06 NOTE — Patient Instructions (Signed)

## 2022-09-06 NOTE — Assessment & Plan Note (Signed)
Encouraged diet and exercise for weight loss ?

## 2022-09-06 NOTE — Assessment & Plan Note (Signed)
Continue meclizine as needed 

## 2022-09-06 NOTE — Assessment & Plan Note (Signed)
Try to identify and avoid foods that trigger reflux Encouraged weight loss as this can help reduce reflux symptoms Continue omeprazole

## 2022-09-06 NOTE — Assessment & Plan Note (Signed)
Continue clonazepam as needed CSA and UDS today

## 2022-09-06 NOTE — Assessment & Plan Note (Signed)
Discussed the importance of good diabetic control Continue gabapentin

## 2022-09-07 LAB — LIPID PANEL
Cholesterol: 155 mg/dL (ref <200)
HDL: 38 mg/dL — ABNORMAL LOW (ref >=50)
LDL Cholesterol (Calc): 89 mg/dL (calc)
Non-HDL Cholesterol (Calc): 117 mg/dL (calc) (ref <130)
Total CHOL/HDL Ratio: 4.1 (calc) (ref <5.0)
Triglycerides: 184 mg/dL — ABNORMAL HIGH (ref <150)

## 2022-09-07 LAB — DM TEMPLATE

## 2022-09-07 LAB — COMPLETE METABOLIC PANEL WITH GFR
AG Ratio: 1.5 (calc) (ref 1.0–2.5)
ALT: 11 U/L (ref 6–29)
AST: 14 U/L (ref 10–35)
Albumin: 4.1 g/dL (ref 3.6–5.1)
Alkaline phosphatase (APISO): 52 U/L (ref 37–153)
BUN: 9 mg/dL (ref 7–25)
CO2: 23 mmol/L (ref 20–32)
Calcium: 9.3 mg/dL (ref 8.6–10.4)
Chloride: 103 mmol/L (ref 98–110)
Creat: 0.61 mg/dL (ref 0.50–1.05)
Globulin: 2.8 g/dL (calc) (ref 1.9–3.7)
Glucose, Bld: 44 mg/dL — ABNORMAL LOW (ref 65–99)
Potassium: 4.5 mmol/L (ref 3.5–5.3)
Sodium: 137 mmol/L (ref 135–146)
Total Bilirubin: 0.6 mg/dL (ref 0.2–1.2)
Total Protein: 6.9 g/dL (ref 6.1–8.1)
eGFR: 101 mL/min/{1.73_m2} (ref >=60)

## 2022-09-07 LAB — CBC
HCT: 48.4 % — ABNORMAL HIGH (ref 35.0–45.0)
Hemoglobin: 15.9 g/dL — ABNORMAL HIGH (ref 11.7–15.5)
MCH: 29.1 pg (ref 27.0–33.0)
MCHC: 32.9 g/dL (ref 32.0–36.0)
MCV: 88.6 fL (ref 80.0–100.0)
MPV: 11.3 fL (ref 7.5–12.5)
Platelets: 269 10*3/uL (ref 140–400)
RBC: 5.46 10*6/uL — ABNORMAL HIGH (ref 3.80–5.10)
RDW: 13.1 % (ref 11.0–15.0)
WBC: 10.9 10*3/uL — ABNORMAL HIGH (ref 3.8–10.8)

## 2022-09-07 LAB — DRUG MONITORING, PANEL 8 WITH CONFIRMATION, URINE
6 Acetylmorphine: NEGATIVE ng/mL (ref <10)
Alcohol Metabolites: NEGATIVE ng/mL (ref <500)
Amphetamines: NEGATIVE ng/mL (ref <500)
Benzodiazepines: NEGATIVE ng/mL (ref <100)
Buprenorphine, Urine: NEGATIVE ng/mL (ref <5)
Cocaine Metabolite: NEGATIVE ng/mL (ref <150)
Creatinine: 12.8 mg/dL — ABNORMAL LOW (ref >=20.0)
MDMA: NEGATIVE ng/mL (ref <500)
Marijuana Metabolite: NEGATIVE ng/mL (ref <20)
Opiates: NEGATIVE ng/mL (ref <100)
Oxidant: NEGATIVE ug/mL (ref <200)
Oxycodone: NEGATIVE ng/mL (ref <100)
Specific Gravity: 1.002 — ABNORMAL LOW (ref >=1.003)
pH: 7.1 (ref 4.5–9.0)

## 2022-09-13 ENCOUNTER — Ambulatory Visit
Admission: RE | Admit: 2022-09-13 | Discharge: 2022-09-13 | Disposition: A | Discharge status: Home / Self Care | Payer: Medicare Other | Source: Ambulatory Visit | Attending: Internal Medicine | Admitting: Internal Medicine

## 2022-09-13 DIAGNOSIS — Z1231 Encounter for screening mammogram for malignant neoplasm of breast: Secondary | ICD-10-CM | POA: Insufficient documentation

## 2022-09-18 ENCOUNTER — Telehealth: Payer: Self-pay

## 2022-09-18 NOTE — Telephone Encounter (Signed)
   CCM RN Visit Note   '@DATE'$ @ Name: Brittany Pruitt MRN: [id number]      DOB: 01-29-1961  Subjective: Brittany Pruitt is a 61 y.o. year old female who is a primary care patient of '@PCP'$ . The patient was referred to the Chronic Care Management team for assistance with care management needs subsequent to provider initiation of CCM services and plan of care.      An unsuccessful telephone outreach was attempted today to contact the patient about Chronic Care Management needs.    Plan:The care management team will reach out to the patient again over the next 30 days.  Noreene Larsson RN, MSN, CCM RN Care Manager  Chronic Care Management Direct Number: [phone]

## 2022-09-23 ENCOUNTER — Encounter: Payer: Self-pay | Admitting: Internal Medicine

## 2022-09-24 ENCOUNTER — Telehealth: Payer: Self-pay

## 2022-09-24 NOTE — Progress Notes (Signed)
  Chronic Care Management   Note  09/24/2022 Name: Brittany Pruitt MRN: [id number] DOB: 07/20/1961  Brittany Pruitt is a 61 y.o. year old female who is a primary care patient of [name], NP. I reached out to Waymon Budge by phone today in response to a referral sent by Brittany Pruitt PCP.  Brittany Pruitt  agreedto scheduling an appointment with the CCM RN Case Manager and Pharm D    Follow up plan: Patient agreed to scheduled appointment with RN Case Manager on 10/07/2022 and Pharm D 10/14/2022(date/time).   Brittany Pruitt, [place], [place] [address] Direct Dial: [phone] [email or web address]

## 2022-10-07 ENCOUNTER — Ambulatory Visit: Payer: Medicare Other | Admitting: Pharmacist

## 2022-10-07 ENCOUNTER — Encounter: Payer: Self-pay | Admitting: Pharmacist

## 2022-10-07 ENCOUNTER — Ambulatory Visit (INDEPENDENT_AMBULATORY_CARE_PROVIDER_SITE_OTHER): Payer: Medicare Other

## 2022-10-07 ENCOUNTER — Telehealth: Payer: Medicare Other

## 2022-10-07 DIAGNOSIS — E1165 Type 2 diabetes mellitus with hyperglycemia: Secondary | ICD-10-CM

## 2022-10-07 DIAGNOSIS — I10 Essential (primary) hypertension: Secondary | ICD-10-CM

## 2022-10-07 NOTE — Chronic Care Management (AMB) (Signed)
Chronic Care Management   CCM RN Visit Note  10/07/2022 Name: Brittany Pruitt MRN: [id number] DOB: [birth year]  Subjective: Brittany Pruitt is a 61 y.o. year old female who is a primary care patient of [name], NP. The patient was referred to the Chronic Care Management team for assistance with care management needs subsequent to provider initiation of CCM services and plan of care.    Today's Visit:  Engaged with patient by telephone for initial visit.     SDOH Interventions Today    Flowsheet Row Most Recent Value  SDOH Interventions   Food Insecurity Interventions Intervention Not Indicated  Housing Interventions Intervention Not Indicated  Transportation Interventions Intervention Not Indicated  Utilities Interventions Intervention Not Indicated  Alcohol Usage Interventions Intervention Not Indicated (Score <7)  Financial Strain Interventions Other (Comment)  [cost constraints with obtaining medications- pharm referral in place]  Physical Activity Interventions Intervention Not Indicated, Other (Comments)  [no structured activity, does work in her home and out in her yard]  Stress Interventions Other (Comment)  [worries alot, education and support given, resources given about LCSW available if needed]  Social Connections Interventions Other (Comment)  [has good support from her family]         Goals Addressed             This Visit's Progress    CCM Expected Outcome:  Monitor, Self-Manage and Reduce Symptoms of Diabetes       Current Barriers:  Knowledge Deficits related to the importance of blood sugars in range to prevent complications from DM Care Coordination needs related to cost constraints of Mounjaro and her insurance not covering Mounjaro in a patient with DM Chronic Disease Management support and 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  Last eye exam > 1 year ago in July   Planned Interventions: Provided  education to patient about basic DM disease process; Reviewed medications with patient and discussed importance of medication adherence;        Reviewed prescribed diet with patient heart healthy/ADA diet; Counseled on importance of regular laboratory monitoring as prescribed;        Discussed plans with patient for ongoing care management follow up and provided patient with direct contact information for care management team;      Provided patient with written educational materials related to hypo and hyperglycemia and importance of correct treatment;       Advised patient, providing education and rationale, to check cbg twice daily and when you have symptoms of low or high blood sugar and record. The patient states her blood sugar range is 150 to 200. Education on the goal of fasting <130 or less and the goal of post prandial of <180.  The patients states her most recent A1C was 8.8. Knows she needs to get it down. Education and support given        call provider for findings outside established parameters;       Referral made to pharmacy team for assistance with cost constraints and side effects of medications. The patient states her insurance does not cover her Mounjaro and the alternate medications are Ozempic and Trulicity. She cannot tolerate Metformin because of side effects. Has an appointment with the pharm D on 10-14-2022;       Review of patient status, including review of consultants reports, relevant laboratory and other test results, and medications completed;       Advised patient to discuss changes in her DM health and  well being with provider;      Screening for signs and symptoms of depression related to chronic disease state;        Assessed social determinant of health barriers;         Symptom Management: Take medications as prescribed   Attend all scheduled provider appointments Call provider office for new concerns or questions  call the Suicide and Crisis Lifeline:  988 call the [place] National Suicide Prevention Lifeline: 239-264-3769 or TTY: 2627121077 TTY 667 305 4767) to talk to a trained counselor call 1-800-273-TALK (toll free, 24 hour hotline) if experiencing a Mental Health or [place]  schedule appointment with eye doctor check feet daily for cuts, sores or redness trim toenails straight across manage portion size wash and dry feet carefully every day wear comfortable, cotton socks wear comfortable, well-fitting shoes  Follow Up Plan: Telephone follow up appointment with care management team member scheduled for: 12-02-2022 at 0900 am       CCM Expected Outcome:  Monitor, Self-Manage, and Reduce Symptoms of Hypertension       Current Barriers:  Knowledge Deficits related to the need to have normalized blood pressures to prevent the risk of heart attack or stroke Chronic Disease Management support and education needs related to effective management of HTN  Planned Interventions: Evaluation of current treatment plan related to hypertension self management and patient's adherence to plan as established by provider;   Provided education to patient re: stroke prevention, s/s of heart attack and stroke; Reviewed prescribed diet heart healthy/ADA diet  Reviewed medications with patient and discussed importance of compliance;  Counseled on the importance of exercise goals with target of 150 minutes per week Discussed plans with patient for ongoing care management follow up and provided patient with direct contact information for care management team; Advised patient, providing education and rationale, to monitor blood pressure daily and record, calling PCP for findings outside established parameters. The patient states she checks her blood pressures when needed. States they are WNL. Denies any acute findings related to blood pressures ;  Reviewed scheduled/upcoming provider appointments including: follow up with the pcp as needed  and call for changes in condition Advised patient to discuss changes in her HTN and heart health  with provider; Provided education on prescribed diet heart healthy/ADA ;  Discussed complications of poorly controlled blood pressure such as heart disease, stroke, circulatory complications, vision complications, kidney impairment, sexual dysfunction;  Screening for signs and symptoms of depression related to chronic disease state;  Assessed social determinant of health barriers;   Symptom Management: Take medications as prescribed   Attend all scheduled provider appointments Call provider office for new concerns or questions  call the Suicide and Crisis Lifeline: 988 call the [place] National Suicide Prevention Lifeline: 949-176-3041 or TTY: 769-349-4390 TTY 417-605-1592) to talk to a trained counselor call 1-800-273-TALK (toll free, 24 hour hotline) if experiencing a Mental Health or [place]  check blood pressure weekly learn about high blood pressure keep a blood pressure log take blood pressure log to all doctor appointments call doctor for signs and symptoms of high blood pressure develop an action plan for high blood pressure keep all doctor appointments take medications for blood pressure exactly as prescribed report new symptoms to your doctor  Follow Up Plan: Telephone follow up appointment with care management team member scheduled for: No further follow up required: 12-02-2022 at 0900 am          Plan:Telephone follow up appointment with care  management team member scheduled for:  12-02-2022 at 0900 am  [place], MSN, CCM RN Care Manager  Chronic Care Management Direct Number: [phone]

## 2022-10-07 NOTE — Patient Instructions (Signed)
Please call the care guide team at [phone] if you need to cancel or reschedule your appointment.   If you are experiencing a Mental Health or [place] or need someone to talk to, please call the Suicide and Crisis Lifeline: 988 call the [place] National Suicide Prevention Lifeline: 7091386641 or TTY: (934) 055-0675 TTY (470)505-7961) to talk to a trained counselor call 1-800-273-TALK (toll free, 24 hour hotline)   Following is a copy of your full provider care plan:   Goals Addressed             This Visit's Progress    CCM Expected Outcome:  Monitor, Self-Manage and Reduce Symptoms of Diabetes       Current Barriers:  Knowledge Deficits related to the importance of blood sugars in range to prevent complications from DM Care Coordination needs related to cost constraints of Mounjaro and her insurance not covering Mounjaro in a patient with DM Chronic Disease Management support and education needs related to effective management of DM Financial Constraints.  Last eye exam > 1 year ago in July   Planned Interventions: Provided education to patient about basic DM disease process; Reviewed medications with patient and discussed importance of medication adherence;        Reviewed prescribed diet with patient heart healthy/ADA diet; Counseled on importance of regular laboratory monitoring as prescribed;        Discussed plans with patient for ongoing care management follow up and provided patient with direct contact information for care management team;      Provided patient with written educational materials related to hypo and hyperglycemia and importance of correct treatment;       Advised patient, providing education and rationale, to check cbg twice daily and when you have symptoms of low or high blood sugar and record. The patient states her blood sugar range is 150 to 200. Education on the goal of fasting <130 or less and the goal of post prandial of <180.  The  patients states her most recent A1C was 8.8. Knows she needs to get it down. Education and support given        call provider for findings outside established parameters;       Referral made to pharmacy team for assistance with cost constraints and side effects of medications. The patient states her insurance does not cover her Mounjaro and the alternate medications are Ozempic and Trulicity. She cannot tolerate Metformin because of side effects. Has an appointment with the pharm D on 10-14-2022;       Review of patient status, including review of consultants reports, relevant laboratory and other test results, and medications completed;       Advised patient to discuss changes in her DM health and well being with provider;      Screening for signs and symptoms of depression related to chronic disease state;        Assessed social determinant of health barriers;         Symptom Management: Take medications as prescribed   Attend all scheduled provider appointments Call provider office for new concerns or questions  call the Suicide and Crisis Lifeline: 988 call the [place] National Suicide Prevention Lifeline: 726-358-3754 or TTY: 661-385-0598 TTY 343-106-3081) to talk to a trained counselor call 1-800-273-TALK (toll free, 24 hour hotline) if experiencing a Mental Health or [place]  schedule appointment with eye doctor check feet daily for cuts, sores or redness trim toenails straight across manage portion size wash and  dry feet carefully every day wear comfortable, cotton socks wear comfortable, well-fitting shoes  Follow Up Plan: Telephone follow up appointment with care management team member scheduled for: 12-02-2022 at 0900 am       CCM Expected Outcome:  Monitor, Self-Manage, and Reduce Symptoms of Hypertension       Current Barriers:  Knowledge Deficits related to the need to have normalized blood pressures to prevent the risk of heart attack or stroke Chronic  Disease Management support and education needs related to effective management of HTN  Planned Interventions: Evaluation of current treatment plan related to hypertension self management and patient's adherence to plan as established by provider;   Provided education to patient re: stroke prevention, s/s of heart attack and stroke; Reviewed prescribed diet heart healthy/ADA diet  Reviewed medications with patient and discussed importance of compliance;  Counseled on the importance of exercise goals with target of 150 minutes per week Discussed plans with patient for ongoing care management follow up and provided patient with direct contact information for care management team; Advised patient, providing education and rationale, to monitor blood pressure daily and record, calling PCP for findings outside established parameters. The patient states she checks her blood pressures when needed. States they are WNL. Denies any acute findings related to blood pressures ;  Reviewed scheduled/upcoming provider appointments including: follow up with the pcp as needed and call for changes in condition Advised patient to discuss changes in her HTN and heart health  with provider; Provided education on prescribed diet heart healthy/ADA ;  Discussed complications of poorly controlled blood pressure such as heart disease, stroke, circulatory complications, vision complications, kidney impairment, sexual dysfunction;  Screening for signs and symptoms of depression related to chronic disease state;  Assessed social determinant of health barriers;   Symptom Management: Take medications as prescribed   Attend all scheduled provider appointments Call provider office for new concerns or questions  call the Suicide and Crisis Lifeline: 988 call the [place] National Suicide Prevention Lifeline: (743)615-7510 or TTY: 913-453-1009 TTY (615) 290-9500) to talk to a trained counselor call 1-800-273-TALK (toll free, 24 hour  hotline) if experiencing a Mental Health or [place]  check blood pressure weekly learn about high blood pressure keep a blood pressure log take blood pressure log to all doctor appointments call doctor for signs and symptoms of high blood pressure develop an action plan for high blood pressure keep all doctor appointments take medications for blood pressure exactly as prescribed report new symptoms to your doctor  Follow Up Plan: Telephone follow up appointment with care management team member scheduled for: No further follow up required: 12-02-2022 at 0900 am          Patient verbalizes understanding of instructions and care plan provided today and agrees to view in [place]. Active MyChart status and patient understanding of how to access instructions and care plan via MyChart confirmed with patient.     Telephone follow up appointment with care management team member scheduled for: 12-02-2022 at 0900 am

## 2022-10-07 NOTE — Patient Instructions (Signed)
Visit Information   Thank you for taking time to visit with me today. Please don't hesitate to contact me if I can be of assistance to you before our next scheduled telephone appointment.  Following are the goals we discussed today:   Goals Addressed             This Visit's Progress    Pharmacy Goals       Our goal A1c is less than 7%. This corresponds with fasting sugars less than 130 and 2 hour after meal sugars less than 180. Please keep a log of your results when checking your blood sugar   Our goal bad cholesterol, or LDL, is less than 70 . This is why it is important to continue taking your simvastatin.  Please have your medications and blood sugar log with you for our next telephone call.  Wallace Cullens, PharmD, [place] [phone]          Our next appointment is by telephone on 10/23/2022 at 8:30 AM  Please call the care guide team at [phone] if you need to cancel or reschedule your appointment.    Following is a copy of your full care plan:  Care Plan : General Pharmacy (Adult)  Updates made by Rennis Petty, RPH-CPP since 10/07/2022 12:00 AM     Problem: Disease Progression      Long-Range Goal: Disease Progression Prevented or Minimized   Start Date: 10/07/2022  Expected End Date: 01/05/2023  This Visit's Progress: On track  Priority: High  Note:   Current Barriers:  Unable to independently afford treatment regimen Reports cost of Mounjaro unaffordable as not covered through her BCBS Medicare plan Unable to achieve control of T2DM   Pharmacist Clinical Goal(s):  patient will verbalize ability to afford treatment regimen achieve control of T2DM as evidenced by A1C  through collaboration with PharmD and provider.    Interventions: 1:1 collaboration with [name], NP regarding development and update of comprehensive plan of care as evidenced by provider attestation and  co-signature Inter-disciplinary care team collaboration (see longitudinal plan of care) Comprehensive medication review performed; medication list updated in electronic medical record  Diabetes: New goal. Uncontrolled; current treatment: glipizide 10 mg twice daily 30 minutes before breakfast and supper Lantus 35 units twice daily Not taking Mounjaro as reports was unaffordable Previous therapies tried: metformin (GI side effects) Current glucose readings: recalls fasting glucose ranging: 150-200; before supper: 110-120; before bedtime: 180-240 Reports occasional hypoglycemic symptoms when skips meals (shaky with readings ~60-65) Current meal patterns: breakfast: granola bar or instant oatmeal; late lunch: baked chicken, vegetable (green beans or broccoli) and rice; supper: sometimes skips; snack: granola bar or popcorn; drinks: coffee and water, occasionally diet Coke 3 times/week or sip of sweet tea Current exercise: stays active around home with housekeeping. Outdoors doing yard work and gardening when warmer Considering joining the gym through [employer] program Statin: simvastatin 20 mg daily Encourage patient to work on having regular well-balanced meals and snacks spread throughout the day, while controlling carbohydrate portion sizes Based on reported income, patient meets criteria for patient assistance for Lantus through [employer] and for [employer] through [employer] to collaborate with [employer] for assistance to patient with applying for patient assistance Counsel patient on s/s of low blood sugar and how to treat lows Review rules of 15s - importance of using 15 grams of sugar to treat low, recheck blood sugar  in 15 minutes, treat again if remains low or, if back to normal, having meal if mealtime or snack  Review examples of sources of 15 grams of sugar Encourage patient to obtain glucose tablets to keep with her Patient interested in trial of Ozempic 0.25  mg weekly to see if she is able to tolerate this medication Collaborate with PCP and provider is agreeable to having patient start Ozempic and confirms office has a sample of Ozempic available for patient to pick up Reports she will pick up sample and plan to start Ozempic 0.25 mg weekly on 12/6 Counseled on GLP1 agonists, including mechanism of action, side effects, and benefits. No personal or family history of medullary thyroid cancer, personal history of pancreatitis or gallbladder disease. Counseled on potential side effects of nausea, stomach upset, queasiness, constipation, and that these generally improve over time. Advised to contact our office with more severe symptoms, including nausea, diarrhea, stomach pain. Patient verbalized understanding. Send patient Ozempic "how to take" video from manufacturer website via [employer]. Request patient review this video today Ozempic and contact myself or office with any questions about administration technique or starting Ozempic. Counsel patient on importance of monitoring blood sugar closely with start of Ozempic and letting office know if having readings outside of established parameters or any hypoglycemia  Hypertension: New goal Controlled; current treatment: lisinopril 5 mg daily Denies checking home blood pressure recently; recalls when checked last month reading ~110/70s Encourage patient to continue to monitor home blood pressure, including when having any dizziness/lightheadedness (note patient also has vertigo) and to let provider know about any readings outside of established parameters or any new/worsening symptoms  Patient Goals/Self-Care Activities patient will:  - check glucose, document, and provide at future appointments - check blood pressure, document, and provide at future appointments - collaborate with provider on medication access solutions - engage in dietary modifications by having regular well-balanced meals and snacks spread  throughout the day, while controlling carbohydrate portion sizes       Consent to CCM Services: Ms. Wish was given information about Chronic Care Management services including:  CCM service includes personalized support from designated clinical staff supervised by her physician, including individualized plan of care and coordination with other care providers 24/7 contact phone numbers for assistance for urgent and routine care needs. Service will only be billed when office clinical staff spend 20 minutes or more in a month to coordinate care. Only one practitioner may furnish and bill the service in a calendar month. The patient may stop CCM services at any time (effective at the end of the month) by phone call to the office staff. The patient will be responsible for cost sharing (co-pay) of up to 20% of the service fee (after annual deductible is met).  Patient agreed to services and verbal consent obtained.   Patient verbalizes understanding of instructions and care plan provided today and agrees to view in [place]. Active MyChart status and patient understanding of how to access instructions and care plan via MyChart confirmed with patient.

## 2022-10-07 NOTE — Chronic Care Management (AMB) (Signed)
Chronic Care Management CCM Pharmacy Note  10/07/2022 Name:  Brittany Pruitt MRN:  [id number] DOB:  [birth year]   Subjective: Brittany Pruitt is an 61 y.o. year old female who is a primary patient of [name], NP.  The CCM team was consulted for assistance with disease management and care coordination needs.    Engaged with patient by telephone for follow up visit for pharmacy case management and/or care coordination services.   Objective:  Medications Reviewed Today     Reviewed by Rennis Petty, RPH-CPP (Pharmacist) on 10/07/22 at 1048  Med List Status: <None>   Medication Order Taking? Sig Documenting Provider Last Dose Status Informant  busPIRone (BUSPAR) 5 MG tablet [id number] Yes Take 1 tablet (5 mg total) by mouth 2 (two) times daily. [name], NP Taking Active   citalopram (CELEXA) 20 MG tablet [id number] Yes Take 1 tablet (20 mg total) by mouth daily. [name], NP Taking Active   clonazePAM (KLONOPIN) 0.5 MG tablet [id number] Yes TAKE ONE-HALF (1/2) TO ONE TABLET TWICE A DAY AS NEEDED FOR ANXIETY [name], [name], NP Taking Active   gabapentin (NEURONTIN) 300 MG capsule [id number] Yes Take 1 capsule (300 mg total) by mouth 4 (four) times daily. [name], NP Taking Active   glipiZIDE (GLUCOTROL) 10 MG tablet [id number] Yes TAKE 1 TABLET BY MOUTH TWICE DAILY BEFORE A MEAL [name], [name], NP Taking Active   glucose blood [facility] VERIO) test strip [id number]  Check blood sugar 6 x daily [name], NP  Active   insulin glargine (LANTUS SOLOSTAR) 100 UNIT/ML Solostar Pen [id number] Yes Inject 35 Units into the skin 2 (two) times daily. [name], NP Taking Active   Lancets [facility] ULTRASOFT) lancets [id number]  Use as instructed [name], NP  Active   lisinopril (ZESTRIL) 5 MG tablet [id number] Yes Take 1 tablet (5 mg total) by mouth daily. [name], NP Taking Active   meclizine (ANTIVERT) 25 MG tablet [id number] Yes TAKE 1 TABLET  THREE TIMES A DAY AS NEEDED FOR DIZZINESS [name], [name], NP Taking Active   Multiple Vitamins-Minerals (CENTRUM SILVER 50+WOMEN PO) [id number] Yes Take 1 tablet by mouth daily. [provider] Taking Active   Omega-3 Fatty Acids (FISH OIL) 1000 MG CAPS [id number] Yes Take 1 capsule by mouth in the morning and at bedtime. [provider] Taking Active   omeprazole (PRILOSEC) 20 MG capsule [id number] Yes Take 1 capsule (20 mg total) by mouth daily. [name], NP Taking Active   simvastatin (ZOCOR) 20 MG tablet [id number] Yes TAKE 1 TABLET BY MOUTH ONCE DAILY AT  [name], NP Taking Active   Patient not taking:  Discontinued 10/07/22 1048 (Patient Preference)             Pertinent Labs:  Lab Results  Component Value Date   HGBA1C 8.8 (A) 09/06/2022   Lab Results  Component Value Date   CHOL 155 09/06/2022   HDL 38 (L) 09/06/2022   LDLCALC 89 09/06/2022   TRIG 184 (H) 09/06/2022   CHOLHDL 4.1 09/06/2022   Lab Results  Component Value Date   CREATININE 0.61 09/06/2022   BUN 9 09/06/2022   NA 137 09/06/2022   K 4.5 09/06/2022   CL 103 09/06/2022   CO2 23 09/06/2022   BP Readings from Last 3 Encounters:  09/06/22 124/84  05/31/22 126/82  02/21/22 116/74    Pulse Readings from Last 3 Encounters:  09/06/22  64  05/31/22 60  02/21/22 62   .  SDOH:  (Social Determinants of Health) assessments and interventions performed:  SDOH Interventions    Flowsheet Row Chronic Care Management from 10/07/2022 in [facility] Office Visit from 09/06/2022 in [facility] Office Visit from 11/21/2021 in [facility] Office Visit from 08/21/2021 in [place]  SDOH Interventions      Food Insecurity Interventions Intervention Not Indicated -- -- --  Housing Interventions Intervention Not Indicated -- -- --  Transportation Interventions Intervention Not Indicated -- -- --  Utilities Interventions  Intervention Not Indicated -- -- --  Alcohol Usage Interventions Intervention Not Indicated (Score <7) -- -- --  Depression Interventions/Treatment  -- Currently on Treatment Currently on Treatment Medication  Financial Strain Interventions Other (Comment)  [cost constraints with obtaining medications- pharm referral in place] -- -- --  Physical Activity Interventions Intervention Not Indicated, Other (Comments)  [no structured activity, does work in her home and out in her yard] -- -- --  Stress Interventions Other (Comment)  [worries alot, education and support given, resources given about LCSW available if needed] -- -- --  Social Connections Interventions Other (Comment)  [has good support from her family] -- -- --       [place]  Review of patient past medical history, allergies, medications, health status, including review of consultants reports, laboratory and other test data, was performed as part of comprehensive evaluation and provision of chronic care management services.   Care Plan : General Pharmacy (Adult)  Updates made by Rennis Petty, RPH-CPP since 10/07/2022 12:00 AM     Problem: Disease Progression      Long-Range Goal: Disease Progression Prevented or Minimized   Start Date: 10/07/2022  Expected End Date: 01/05/2023  This Visit's Progress: On track  Priority: High  Note:   Current Barriers:  Unable to independently afford treatment regimen Reports cost of Mounjaro unaffordable as not covered through her BCBS Medicare plan Unable to achieve control of T2DM   Pharmacist Clinical Goal(s):  patient will verbalize ability to afford treatment regimen achieve control of T2DM as evidenced by A1C  through collaboration with PharmD and provider.    Interventions: 1:1 collaboration with [name], NP regarding development and update of comprehensive plan of care as evidenced by provider attestation and co-signature Inter-disciplinary care team  collaboration (see longitudinal plan of care) Comprehensive medication review performed; medication list updated in electronic medical record Caution patient for increased risk of dizziness and sedation with taking clonazepam and gabapentin, particularly when taken in combination Patient reports taking clonazepam only as needed and lays down after taking this medication Reports takes meclizine as needed for vertigo and sits or lays down after taking  Diabetes: New goal. Uncontrolled; current treatment: glipizide 10 mg twice daily 30 minutes before breakfast and supper Lantus 35 units twice daily Not taking Mounjaro as reports was unaffordable Previous therapies tried: metformin (GI side effects) Current glucose readings: recalls fasting glucose ranging: 150-200; before supper: 110-120; before bedtime: 180-240 Reports occasional hypoglycemic symptoms when skips meals (shaky with readings ~60-65) Current meal patterns: breakfast: granola bar or instant oatmeal; late lunch: baked chicken, vegetable (green beans or broccoli) and rice; supper: sometimes skips; snack: granola bar or popcorn; drinks: coffee and water, occasionally diet Coke 3 times/week or sip of sweet tea Current exercise: stays active around home with housekeeping. Outdoors doing yard work and gardening when warmer Considering joining the gym  through [employer] program Statin: simvastatin 20 mg daily Encourage patient to work on having regular well-balanced meals and snacks spread throughout the day, while controlling carbohydrate portion sizes Based on reported income, patient meets criteria for patient assistance for Lantus through [employer] and for [employer] through [employer] plan to collaborate with [place] [employer] for assistance to patient with applying for patient assistance Counsel patient on s/s of low blood sugar and how to treat lows Review rules of 15s - importance of using 15 grams of sugar to treat low,  recheck blood sugar in 15 minutes, treat again if remains low or, if back to normal, having meal if mealtime or snack  Review examples of sources of 15 grams of sugar Encourage patient to obtain glucose tablets to keep with her Patient interested in trial of Ozempic 0.25 mg weekly to see if she is able to tolerate this medication Collaborate with PCP and provider is agreeable to having patient start Ozempic and confirms office has a sample of Ozempic available for patient to pick up Reports she will pick up sample and plan to start Ozempic 0.25 mg weekly on 12/6 Counseled on GLP1 agonists, including mechanism of action, side effects, and benefits. No personal or family history of medullary thyroid cancer, personal history of pancreatitis or gallbladder disease. Counseled on potential side effects of nausea, stomach upset, queasiness, constipation, and that these generally improve over time. Advised to contact our office with more severe symptoms, including nausea, diarrhea, stomach pain. Patient verbalized understanding. Send patient Ozempic "how to take" video from manufacturer website via [employer]. Request patient review this video today Ozempic and contact myself or office with any questions about administration technique or starting Ozempic. Counsel patient on importance of monitoring blood sugar closely with start of Ozempic and letting office know if having readings outside of established parameters or any hypoglycemia  Hypertension: New goal Controlled; current treatment: lisinopril 5 mg daily Denies checking home blood pressure recently; recalls when checked last month reading ~110/70s Encourage patient to continue to monitor home blood pressure, including when having any dizziness/lightheadedness (note patient also has vertigo) and to let provider know about any readings outside of established parameters or any new/worsening symptoms  Patient Goals/Self-Care Activities patient will:  - check  glucose, document, and provide at future appointments - check blood pressure, document, and provide at future appointments - collaborate with provider on medication access solutions - engage in dietary modifications by having regular well-balanced meals and snacks spread throughout the day, while controlling carbohydrate portion sizes        Plan: Telephone follow up appointment with care management team member scheduled for:  10/23/2022 at 8:30 AM  Wallace Cullens, PharmD, [place] [phone]

## 2022-10-07 NOTE — Progress Notes (Signed)
Can you save a sample of 0.25 mg ozempic for this patient to come pick up.

## 2022-10-07 NOTE — Plan of Care (Signed)
Chronic Care Management Provider Comprehensive Care Plan    10/07/2022 Name: Brittany Pruitt MRN: [id number] DOB: 1961-01-31  Referral to Chronic Care Management (CCM) services was placed by Provider:  Webb Silversmith, NP on Date: 09-06-2022.  Chronic Condition 1: HTN Provider Assessment and Plan Controlled on lisinopril Reinforced DASH diet and exercise for weight loss C-Met today   Expected Outcome/Goals Addressed This Visit (Provider CCM goals/Provider Assessment and plan   CCM (HYPERTENSION)  EXPECTED OUTCOME:  MONITOR,SELF- MANAGE AND REDUCE SYMPTOMS OF HYPERTENSION   Symptom Management Condition 1: Take all medications as prescribed Attend all scheduled provider appointments Call provider office for new concerns or questions  call the Suicide and Crisis Lifeline: 988 call the [place] National Suicide Prevention Lifeline: 367 101 0654 or TTY: (939) 132-3073 TTY 908-522-9478) to talk to a trained counselor call 1-800-273-TALK (toll free, 24 hour hotline) if experiencing a Mental Health or [place]  check blood pressure weekly choose a place to take my blood pressure (home, clinic or office, retail store) learn about high blood pressure keep a blood pressure log take blood pressure log to all doctor appointments call doctor for signs and symptoms of high blood pressure keep all doctor appointments take medications for blood pressure exactly as prescribed report new symptoms to your doctor  Chronic Condition 2: DM Provider Assessment and Plan POCT A1c 8.8% Urine micro albumin has been checked within the last year Encouraged her to consume a low-carb diet and exercise for weight loss Continue glipizide and Lantus We will trial Mounjaro 2.5 mg daily Encourage routine eye exam Encourage routine foot exam Flu shot today Pneumovax UTD Encouraged her to get her COVID-vaccine   Expected Outcome/Goals Addressed This Visit (Provider CCM goals/Provider Assessment and  plan   CCM (Diabetes)  EXPECTED OUTCOME:  MONITOR,SELF- MANAGE AND REDUCE SYMPTOMS OF Diabetes   Symptom Management Condition 2: Take all medications as prescribed Attend all scheduled provider appointments Call provider office for new concerns or questions  call the Suicide and Crisis Lifeline: 988 call the [place] National Suicide Prevention Lifeline: 609 208 8476 or TTY: 4316705939 TTY 603 587 1722) to talk to a trained counselor call 1-800-273-TALK (toll free, 24 hour hotline) if experiencing a Mental Health or [place]  schedule appointment with eye doctor check feet daily for cuts, sores or redness trim toenails straight across manage portion size wash and dry feet carefully every day wear comfortable, cotton socks wear comfortable, well-fitting shoes  Problem List Patient Active Problem List   Diagnosis Date Noted   GERD (gastroesophageal reflux disease) 05/31/2022   Senile purpura ([place]) 05/31/2022   Osteopenia 02/21/2022   Polycythemia 02/21/2022   Class 1 obesity due to excess calories with body mass index (BMI) of 34.0 to 34.9 in adult 08/21/2021   Meniere's disease of both ears 12/11/2020   Frequent headaches 07/01/2018   Neuropathy associated with endocrine disorder ([place]) 02/02/2018   Anxiety and depression 02/28/2017   HTN (hypertension) 02/28/2017   HLD (hyperlipidemia) 11/15/2015   Type 2 diabetes mellitus with hyperglycemia ([place]) 05/25/2015   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    Medication Management  Current Outpatient Medications:    busPIRone (BUSPAR) 5 MG tablet, Take 1 tablet (5 mg total) by mouth 2 (two) times daily., Disp: 180 tablet, Rfl: 1   Calcium Carb-Cholecalciferol (CALCIUM 1000 + D PO), Take 1 each by mouth daily., Disp: , Rfl:    citalopram (CELEXA) 20 MG tablet, Take 1 tablet (20 mg total) by mouth daily., Disp: 90 tablet, Rfl: 1   clonazePAM (  KLONOPIN) 0.5 MG tablet, TAKE ONE-HALF (1/2) TO ONE TABLET TWICE A DAY AS NEEDED  FOR ANXIETY, Disp: 45 tablet, Rfl: 0   gabapentin (NEURONTIN) 300 MG capsule, Take 1 capsule (300 mg total) by mouth 4 (four) times daily., Disp: 360 capsule, Rfl: 1   glipiZIDE (GLUCOTROL) 10 MG tablet, TAKE 1 TABLET BY MOUTH TWICE DAILY BEFORE A MEAL, Disp: 180 tablet, Rfl: 1   glucose blood (ONETOUCH VERIO) test strip, Check blood sugar 6 x daily, Disp: 200 each, Rfl: 3   insulin glargine (LANTUS SOLOSTAR) 100 UNIT/ML Solostar Pen, Inject 35 Units into the skin 2 (two) times daily., Disp: 63 mL, Rfl: 0   Lancets (ONETOUCH ULTRASOFT) lancets, Use as instructed, Disp: 100 each, Rfl: 12   lisinopril (ZESTRIL) 5 MG tablet, Take 1 tablet (5 mg total) by mouth daily., Disp: 90 tablet, Rfl: 1   meclizine (ANTIVERT) 25 MG tablet, TAKE 1 TABLET THREE TIMES A DAY AS NEEDED FOR DIZZINESS, Disp: 30 tablet, Rfl: 0   Omega-3 Fatty Acids (FISH OIL) 1000 MG CAPS, Take 1 capsule by mouth in the morning and at bedtime., Disp: , Rfl:    omeprazole (PRILOSEC) 20 MG capsule, Take 1 capsule (20 mg total) by mouth daily., Disp: 90 capsule, Rfl: 1   simvastatin (ZOCOR) 20 MG tablet, TAKE 1 TABLET BY MOUTH ONCE DAILY AT  6PM, Disp: 90 tablet, Rfl: 1   tirzepatide (MOUNJARO) 2.5 MG/0.5ML Pen, Inject 2.5 mg into the skin once a week. (Patient not taking: Reported on 10/07/2022), Disp: 6 mL, Rfl: 0   vitamin C (ASCORBIC ACID) 500 MG tablet, Take 500 mg by mouth daily., Disp: , Rfl:   Cognitive Assessment Identity Confirmed: : Name; DOB Cognitive Status: Normal   Functional Assessment Hearing Difficulty or Deaf: yes Hearing Management: has problems hearing, does not use hearing aides Wear Glasses or Blind: yes Vision Management: wears glasses- last eye exam over a year ago Concentrating, Remembering or Making Decisions Difficulty (CP): no Difficulty Communicating: no Difficulty Eating/Swallowing: no Walking or Climbing Stairs Difficulty: yes Mobility Management: does not use equipment, vertigo causes balance  issues Dressing/Bathing Difficulty: no Doing Errands Independently Difficulty (such as shopping) (CP): no Change in Functional Status Since Onset of Current Illness/Injury: no   Caregiver Assessment  Primary Source of Support/Comfort: spouse Name of Support/Comfort Primary Source: Kenisha Lynds- spouse People in Home: spouse Family Caregiver if Needed: spouse Family Caregiver Names: Legrand Como Primary Roles/Responsibilities: disabled   Planned Interventions  Provided education to patient about basic DM disease process; Reviewed medications with patient and discussed importance of medication adherence;        Reviewed prescribed diet with patient heart healthy/ADA diet; Counseled on importance of regular laboratory monitoring as prescribed;        Discussed plans with patient for ongoing care management follow up and provided patient with direct contact information for care management team;      Provided patient with written educational materials related to hypo and hyperglycemia and importance of correct treatment;       Advised patient, providing education and rationale, to check cbg twice daily and when you have symptoms of low or high blood sugar and record. The patient states her blood sugar range is 150 to 200. Education on the goal of fasting <130 or less and the goal of post prandial of <180.  The patients states her most recent A1C was 8.8. Knows she needs to get it down. Education and support given  call provider for findings outside established parameters;       Referral made to pharmacy team for assistance with cost constraints and side effects of medications. The patient states her insurance does not cover her Mounjaro and the alternate medications are Ozempic and Trulicity. She cannot tolerate Metformin because of side effects. Has an appointment with the pharm D on 10-14-2022;       Review of patient status, including review of consultants reports, relevant laboratory and  other test results, and medications completed;       Advised patient to discuss changes in her DM health and well being with provider;      Screening for signs and symptoms of depression related to chronic disease state;        Assessed social determinant of health barriers;        Evaluation of current treatment plan related to hypertension self management and patient's adherence to plan as established by provider;   Provided education to patient re: stroke prevention, s/s of heart attack and stroke; Reviewed prescribed diet heart healthy/ADA diet  Reviewed medications with patient and discussed importance of compliance;  Counseled on the importance of exercise goals with target of 150 minutes per week Discussed plans with patient for ongoing care management follow up and provided patient with direct contact information for care management team; Advised patient, providing education and rationale, to monitor blood pressure daily and record, calling PCP for findings outside established parameters. The patient states she checks her blood pressures when needed. States they are WNL. Denies any acute findings related to blood pressures ;  Reviewed scheduled/upcoming provider appointments including: follow up with the pcp as needed and call for changes in condition Advised patient to discuss changes in her HTN and heart health  with provider; Provided education on prescribed diet heart healthy/ADA ;  Discussed complications of poorly controlled blood pressure such as heart disease, stroke, circulatory complications, vision complications, kidney impairment, sexual dysfunction;  Screening for signs and symptoms of depression related to chronic disease state;  Assessed social determinant of health barriers   Interaction and coordination with outside resources, practitioners, and providers See CCM Referral  Care Plan: Available in MyChart

## 2022-10-14 ENCOUNTER — Telehealth: Payer: Medicare Other

## 2022-10-23 ENCOUNTER — Ambulatory Visit: Payer: Medicare Other | Admitting: Pharmacist

## 2022-10-23 ENCOUNTER — Telehealth: Payer: Self-pay

## 2022-10-23 ENCOUNTER — Telehealth: Payer: Medicare Other

## 2022-10-23 DIAGNOSIS — E1165 Type 2 diabetes mellitus with hyperglycemia: Secondary | ICD-10-CM

## 2022-10-23 NOTE — Telephone Encounter (Signed)
Mailed PAP  application FOR OZEMPIC  to patient home.   [facility] Patient Advocate

## 2022-10-23 NOTE — Chronic Care Management (AMB) (Signed)
Chronic Care Management CCM Pharmacy Note  10/23/2022 Name:  Brittany Pruitt MRN:  [id number] DOB:  05-Jul-1961   Subjective: Brittany Pruitt is an 61 y.o. year old female who is a primary patient of [name], NP.  The CCM team was consulted for assistance with disease management and care coordination needs.    Engaged with patient by telephone for follow up visit for pharmacy case management and/or care coordination services.   Speak with patient only briefly today as she requests to reschedule the remainder of our appointment per patient request as she has a sick family member to care for.  Objective:  Medications Reviewed Today     Reviewed by Rennis Petty, RPH-CPP (Pharmacist) on 10/23/22 at [place] List Status: <None>   Medication Order Taking? Sig Documenting Provider Last Dose Status Informant  busPIRone (BUSPAR) 5 MG tablet [id number]  Take 1 tablet (5 mg total) by mouth 2 (two) times daily. [name], NP  Active   citalopram (CELEXA) 20 MG tablet [id number]  Take 1 tablet (20 mg total) by mouth daily. [name], NP  Active   clonazePAM (KLONOPIN) 0.5 MG tablet [id number]  TAKE ONE-HALF (1/2) TO ONE TABLET TWICE A DAY AS NEEDED FOR ANXIETY [name], [name], NP  Active   gabapentin (NEURONTIN) 300 MG capsule [id number]  Take 1 capsule (300 mg total) by mouth 4 (four) times daily. [name], NP  Active   glipiZIDE (GLUCOTROL) 10 MG tablet [id number]  TAKE 1 TABLET BY MOUTH TWICE DAILY BEFORE A MEAL [name], [name], NP  Active   glucose blood [facility] VERIO) test strip [id number]  Check blood sugar 6 x daily [name], NP  Active   insulin glargine (LANTUS SOLOSTAR) 100 UNIT/ML Solostar Pen [id number]  Inject 35 Units into the skin 2 (two) times daily.  Patient taking differently: Inject 30 Units into the skin 2 (two) times daily.   [name], NP  Active   Lancets [facility] ULTRASOFT) lancets [id number]  Use as instructed [name], NP   Active   lisinopril (ZESTRIL) 5 MG tablet [id number]  Take 1 tablet (5 mg total) by mouth daily. [name], NP  Active   meclizine (ANTIVERT) 25 MG tablet [id number]  TAKE 1 TABLET THREE TIMES A DAY AS NEEDED FOR DIZZINESS [name], [name], NP  Active   Multiple Vitamins-Minerals (CENTRUM SILVER 50+WOMEN PO) [id number]  Take 1 tablet by mouth daily. [provider]  Active   Omega-3 Fatty Acids (FISH OIL) 1000 MG CAPS [id number]  Take 1 capsule by mouth in the morning and at bedtime. [provider]  Active   omeprazole (PRILOSEC) 20 MG capsule [id number]  Take 1 capsule (20 mg total) by mouth daily. [name], NP  Active   Semaglutide,0.25 or 0.'5MG'$ /DOS, (OZEMPIC, 0.25 OR 0.5 MG/DOSE,) 2 MG/1.5ML SOPN [id number] Yes Inject into the skin. 0.25 mg once weekly for 4 weeks, then will increase to 0.5 mg weekly [provider] Taking Active   simvastatin (ZOCOR) 20 MG tablet [id number]  TAKE 1 TABLET BY MOUTH ONCE DAILY AT  [name], NP  Active             Pertinent Labs:  Lab Results  Component Value Date   HGBA1C 8.8 (A) 09/06/2022   Lab Results  Component Value Date   CHOL 155 09/06/2022   HDL 38 (L) 09/06/2022   LDLCALC 89 09/06/2022   TRIG 184 (  H) 09/06/2022   CHOLHDL 4.1 09/06/2022   Lab Results  Component Value Date   CREATININE 0.61 09/06/2022   BUN 9 09/06/2022   NA 137 09/06/2022   K 4.5 09/06/2022   CL 103 09/06/2022   CO2 23 09/06/2022    SDOH:  (Social Determinants of Health) assessments and interventions performed:  SDOH Interventions    Flowsheet Row Chronic Care Management from 10/07/2022 in [facility] Office Visit from 09/06/2022 in [facility] Office Visit from 11/21/2021 in [facility] Office Visit from 08/21/2021 in [place]  SDOH Interventions      Food Insecurity Interventions Intervention Not Indicated -- -- --  Housing Interventions Intervention Not  Indicated -- -- --  Transportation Interventions Intervention Not Indicated -- -- --  Utilities Interventions Intervention Not Indicated -- -- --  Alcohol Usage Interventions Intervention Not Indicated (Score <7) -- -- --  Depression Interventions/Treatment  -- Currently on Treatment Currently on Treatment Medication  Financial Strain Interventions Other (Comment)  [cost constraints with obtaining medications- pharm referral in place] -- -- --  Physical Activity Interventions Intervention Not Indicated, Other (Comments)  [no structured activity, does work in her home and out in her yard] -- -- --  Stress Interventions Other (Comment)  [worries alot, education and support given, resources given about LCSW available if needed] -- -- --  Social Connections Interventions Other (Comment)  [has good support from her family] -- -- --       [place]  Review of patient past medical history, allergies, medications, health status, including review of consultants reports, laboratory and other test data, was performed as part of comprehensive evaluation and provision of chronic care management services.   Care Plan : General Pharmacy (Adult)  Updates made by Rennis Petty, RPH-CPP since 10/23/2022 12:00 AM     Problem: Disease Progression      Long-Range Goal: Disease Progression Prevented or Minimized   Start Date: 10/07/2022  Expected End Date: 01/05/2023  Recent Progress: On track  Priority: High  Note:   Current Barriers:  Unable to independently afford treatment regimen Reports cost of Mounjaro unaffordable as not covered through her BCBS Medicare plan Unable to achieve control of T2DM   Pharmacist Clinical Goal(s):  patient will verbalize ability to afford treatment regimen achieve control of T2DM as evidenced by A1C  through collaboration with PharmD and provider.    Interventions: 1:1 collaboration with [name], NP regarding development and update of comprehensive  plan of care as evidenced by provider attestation and co-signature Inter-disciplinary care team collaboration (see longitudinal plan of care) Speak with patient only briefly today as she requests to reschedule the remainder of our appointment per patient request as she has a sick family member to care for  Diabetes: New goal. Uncontrolled; current treatment: glipizide 10 mg twice daily 30 minutes before breakfast and supper Lantus 30 units twice daily (reports self-decreased dose since start of Ozempic) Ozempic 0.25 mg weekly on Tuesdays (started on 12/5) Previous therapies tried: metformin (GI side effects) Current glucose readings: recalls fasting glucose ranging: 85-100; after meals: 160-170s Denies recent hypoglycemia Statin: simvastatin 20 mg daily Have encouraged patient to work on having regular well-balanced meals and snacks spread throughout the day, while controlling carbohydrate portion sizes Based on reported income, patient meets criteria for patient assistance for Lantus through [employer] and for [employer] through [place] collaborate with [employer] team for assistance  to patient with applying for patient assistance Have counseled patient on s/s of low blood sugar and how to treat lows Review rules of 15s - importance of using 15 grams of sugar to treat low, recheck blood sugar in 15 minutes, treat again if remains low or, if back to normal, having meal if mealtime or snack  Review examples of sources of 15 grams of sugar Encouraged patient to obtain glucose tablets to keep with her Patient reports doing well with starting Ozempic 0.25 mg weekly. Reports some decreased frequency of bowel movements (now typically every other day, rather than daily), but tolerating well. Reports self-decreased her Lantus dose from 35 units twice daily to 30 units twice daily in order to avoid low blood sugar Patient planning to increase dose as planned to Ozempic  0.5 mg weekly on 11/05/2022 Collaborate with PCP to recommend planning to reduce patient's glipizide dose with the Ozempic dose increase Provider agrees to plan to have patient decrease glipizide dose to 5 mg twice daily when increases Ozempic dose to 0.5 mg weekly on 11/05/2022 Follow up with patient who verbalizes understanding of plan via teach back Counsel patient to continue to monitor home blood sugar, keep log of results, have this record to review during upcoming appointments and to contact office sooner if needed for reading outside of established parameters or for symptoms such as any episodes of hypoglycemia  Patient Goals/Self-Care Activities patient will:  - check glucose, document, and provide at future appointments - check blood pressure, document, and provide at future appointments - collaborate with provider on medication access solutions - engage in dietary modifications by having regular well-balanced meals and snacks spread throughout the day, while controlling carbohydrate portion sizes       Plan: Telephone follow up appointment with care management team member scheduled for:  11/06/2022 at 11:15 am  Wallace Cullens, PharmD, [place] [phone]

## 2022-10-23 NOTE — Patient Instructions (Signed)
Visit Information  Thank you for taking time to visit with me today. Please don't hesitate to contact me if I can be of assistance to you before our next scheduled telephone appointment.  Following are the goals we discussed today:   Goals Addressed             This Visit's Progress    Pharmacy Goals       Our goal A1c is less than 7%. This corresponds with fasting sugars less than 130 and 2 hour after meal sugars less than 180. Please keep a log of your results when checking your blood sugar   Our goal bad cholesterol, or LDL, is less than 70 . This is why it is important to continue taking your simvastatin.  Please have your medications and blood sugar log with you for our next telephone call.   Wallace Cullens, PharmD, [place] [phone]          Our next appointment is by telephone on 11/06/2022 at 11:15 am  Please call the care guide team at [phone] if you need to cancel or reschedule your appointment.    Patient verbalizes understanding of instructions and care plan provided today and agrees to view in [place]. Active MyChart status and patient understanding of how to access instructions and care plan via MyChart confirmed with patient.

## 2022-10-23 NOTE — Progress Notes (Signed)
NOTED. I tried calling pt no VM. I am going to [employer] to home for [place] [facility] Patient Advocate

## 2022-10-23 NOTE — Progress Notes (Signed)
Will mail to patient [employer]. Please be aware that company([employer]) is taking 4-6 weeks to process application and shipments  [place] [facility] Patient Advocate

## 2022-10-24 ENCOUNTER — Telehealth: Payer: Medicare Other | Admitting: Physician Assistant

## 2022-10-24 DIAGNOSIS — J208 Acute bronchitis due to other specified organisms: Secondary | ICD-10-CM | POA: Diagnosis not present

## 2022-10-24 MED ORDER — BENZONATATE 100 MG PO CAPS: 100.0000 mg | ORAL_CAPSULE | Freq: Three times a day (TID) | ORAL | 0 refills | Status: DC | PRN

## 2022-10-24 MED ORDER — PROMETHAZINE-DM 6.25-15 MG/5ML PO SYRP: 5.0000 mL | ORAL_SOLUTION | Freq: Four times a day (QID) | ORAL | 0 refills | Status: DC | PRN

## 2022-10-24 NOTE — Progress Notes (Signed)
We are sorry that you are not feeling well.  Here is how we plan to help!  Based on your presentation I believe you most likely have A cough due to a virus.  This is called viral bronchitis and is best treated by rest, plenty of fluids and control of the cough.  You may use Ibuprofen or Tylenol as directed to help your symptoms.     In addition you may use A prescription cough medication called Tessalon Perles '100mg'$ . You may take 1-2 capsules every 8 hours as needed for your cough. I have also prescribed Promethazine DM Take 5 mL every 6 hours as needed for cough. Can use together with Tessalon perles.   From your responses in the eVisit questionnaire you describe inflammation in the upper respiratory tract which is causing a significant cough.  This is commonly called Bronchitis and has four common causes:   Allergies Viral Infections Acid Reflux Bacterial Infection Allergies, viruses and acid reflux are treated by controlling symptoms or eliminating the cause. An example might be a cough caused by taking certain blood pressure medications. You stop the cough by changing the medication. Another example might be a cough caused by acid reflux. Controlling the reflux helps control the cough.  USE OF BRONCHODILATOR ("RESCUE") INHALERS: There is a risk from using your bronchodilator too frequently.  The risk is that over-reliance on a medication which only relaxes the muscles surrounding the breathing tubes can reduce the effectiveness of medications prescribed to reduce swelling and congestion of the tubes themselves.  Although you feel brief relief from the bronchodilator inhaler, your asthma may actually be worsening with the tubes becoming more swollen and filled with mucus.  This can delay other crucial treatments, such as oral steroid medications. If you need to use a bronchodilator inhaler daily, several times per day, you should discuss this with your provider.  There are probably better treatments  that could be used to keep your asthma under control.     HOME CARE Only take medications as instructed by your medical team. Complete the entire course of an antibiotic. Drink plenty of fluids and get plenty of rest. Avoid close contacts especially the very young and the elderly Cover your mouth if you cough or cough into your sleeve. Always remember to wash your hands A steam or ultrasonic humidifier can help congestion.   GET HELP RIGHT AWAY IF: You develop worsening fever. You become short of breath You cough up blood. Your symptoms persist after you have completed your treatment plan MAKE SURE YOU  Understand these instructions. Will watch your condition. Will get help right away if you are not doing well or get worse.    Thank you for choosing an e-visit.  Your e-visit answers were reviewed by a board certified advanced clinical practitioner to complete your personal care plan. Depending upon the condition, your plan could have included both over the counter or prescription medications.  Please review your pharmacy choice. Make sure the pharmacy is open so you can pick up prescription now. If there is a problem, you may contact your provider through [employer] and have the prescription routed to another pharmacy.  Your safety is important to [place]. If you have drug allergies check your prescription carefully.   For the next 24 hours you can use MyChart to ask questions about today's visit, request a non-urgent call back, or ask for a work or school excuse. You will get an email in the next two days  asking about your experience. I hope that your e-visit has been valuable and will speed your recovery.  I have spent 5 minutes in review of e-visit questionnaire, review and updating patient chart, medical decision making and response to patient.   Mar Daring, PA-C

## 2022-10-29 ENCOUNTER — Other Ambulatory Visit: Payer: Self-pay

## 2022-10-29 ENCOUNTER — Inpatient Hospital Stay
Admission: EM | Admit: 2022-10-29 | Discharge: 2022-10-31 | DRG: 193 | Disposition: A | Discharge status: Home / Self Care | Payer: Medicare Other | Attending: Internal Medicine | Admitting: Internal Medicine

## 2022-10-29 ENCOUNTER — Emergency Department: Payer: Medicare Other

## 2022-10-29 DIAGNOSIS — I7121 Aneurysm of the ascending aorta, without rupture: Secondary | ICD-10-CM | POA: Diagnosis not present

## 2022-10-29 DIAGNOSIS — I700 Atherosclerosis of aorta: Secondary | ICD-10-CM | POA: Diagnosis not present

## 2022-10-29 DIAGNOSIS — I959 Hypotension, unspecified: Secondary | ICD-10-CM

## 2022-10-29 DIAGNOSIS — Z7984 Long term (current) use of oral hypoglycemic drugs: Secondary | ICD-10-CM

## 2022-10-29 DIAGNOSIS — J440 Chronic obstructive pulmonary disease with acute lower respiratory infection: Secondary | ICD-10-CM | POA: Diagnosis present

## 2022-10-29 DIAGNOSIS — Z833 Family history of diabetes mellitus: Secondary | ICD-10-CM | POA: Diagnosis not present

## 2022-10-29 DIAGNOSIS — Z882 Allergy status to sulfonamides status: Secondary | ICD-10-CM | POA: Diagnosis not present

## 2022-10-29 DIAGNOSIS — Z8249 Family history of ischemic heart disease and other diseases of the circulatory system: Secondary | ICD-10-CM | POA: Diagnosis not present

## 2022-10-29 DIAGNOSIS — F32A Depression, unspecified: Secondary | ICD-10-CM | POA: Diagnosis present

## 2022-10-29 DIAGNOSIS — Z803 Family history of malignant neoplasm of breast: Secondary | ICD-10-CM | POA: Diagnosis not present

## 2022-10-29 DIAGNOSIS — Z794 Long term (current) use of insulin: Secondary | ICD-10-CM | POA: Diagnosis not present

## 2022-10-29 DIAGNOSIS — Z818 Family history of other mental and behavioral disorders: Secondary | ICD-10-CM | POA: Diagnosis not present

## 2022-10-29 DIAGNOSIS — E876 Hypokalemia: Secondary | ICD-10-CM

## 2022-10-29 DIAGNOSIS — R0602 Shortness of breath: Secondary | ICD-10-CM | POA: Diagnosis not present

## 2022-10-29 DIAGNOSIS — E1165 Type 2 diabetes mellitus with hyperglycemia: Secondary | ICD-10-CM | POA: Diagnosis not present

## 2022-10-29 DIAGNOSIS — J9601 Acute respiratory failure with hypoxia: Secondary | ICD-10-CM | POA: Diagnosis not present

## 2022-10-29 DIAGNOSIS — F17210 Nicotine dependence, cigarettes, uncomplicated: Secondary | ICD-10-CM | POA: Diagnosis not present

## 2022-10-29 DIAGNOSIS — I10 Essential (primary) hypertension: Secondary | ICD-10-CM | POA: Diagnosis present

## 2022-10-29 DIAGNOSIS — F419 Anxiety disorder, unspecified: Secondary | ICD-10-CM | POA: Diagnosis not present

## 2022-10-29 DIAGNOSIS — Z90710 Acquired absence of both cervix and uterus: Secondary | ICD-10-CM | POA: Diagnosis not present

## 2022-10-29 DIAGNOSIS — Z8049 Family history of malignant neoplasm of other genital organs: Secondary | ICD-10-CM | POA: Diagnosis not present

## 2022-10-29 DIAGNOSIS — Z79899 Other long term (current) drug therapy: Secondary | ICD-10-CM

## 2022-10-29 DIAGNOSIS — R911 Solitary pulmonary nodule: Secondary | ICD-10-CM | POA: Diagnosis not present

## 2022-10-29 DIAGNOSIS — J441 Chronic obstructive pulmonary disease with (acute) exacerbation: Secondary | ICD-10-CM

## 2022-10-29 DIAGNOSIS — Z800 Family history of malignant neoplasm of digestive organs: Secondary | ICD-10-CM

## 2022-10-29 DIAGNOSIS — Z1152 Encounter for screening for COVID-19: Secondary | ICD-10-CM

## 2022-10-29 DIAGNOSIS — E785 Hyperlipidemia, unspecified: Secondary | ICD-10-CM | POA: Diagnosis present

## 2022-10-29 DIAGNOSIS — R197 Diarrhea, unspecified: Secondary | ICD-10-CM | POA: Diagnosis not present

## 2022-10-29 DIAGNOSIS — J189 Pneumonia, unspecified organism: Secondary | ICD-10-CM | POA: Diagnosis present

## 2022-10-29 DIAGNOSIS — Z7985 Long-term (current) use of injectable non-insulin antidiabetic drugs: Secondary | ICD-10-CM

## 2022-10-29 DIAGNOSIS — R0902 Hypoxemia: Secondary | ICD-10-CM | POA: Diagnosis not present

## 2022-10-29 DIAGNOSIS — E119 Type 2 diabetes mellitus without complications: Secondary | ICD-10-CM

## 2022-10-29 LAB — LACTIC ACID, PLASMA: Lactic Acid, Venous: 1.2 mmol/L (ref 0.5–1.9)

## 2022-10-29 LAB — COMPREHENSIVE METABOLIC PANEL
ALT: 21 U/L (ref 0–44)
AST: 35 U/L (ref 15–41)
Albumin: 3.3 g/dL — ABNORMAL LOW (ref 3.5–5.0)
Alkaline Phosphatase: 35 U/L — ABNORMAL LOW (ref 38–126)
Anion gap: 10 (ref 5–15)
BUN: 20 mg/dL (ref 8–23)
CO2: 25 mmol/L (ref 22–32)
Calcium: 8.4 mg/dL — ABNORMAL LOW (ref 8.9–10.3)
Chloride: 98 mmol/L (ref 98–111)
Creatinine, Ser: 0.83 mg/dL (ref 0.44–1.00)
GFR, Estimated: >60 mL/min (ref >60)
Glucose, Bld: 223 mg/dL — ABNORMAL HIGH (ref 70–99)
Potassium: 3.3 mmol/L — ABNORMAL LOW (ref 3.5–5.1)
Sodium: 133 mmol/L — ABNORMAL LOW (ref 135–145)
Total Bilirubin: 1.2 mg/dL (ref 0.3–1.2)
Total Protein: 6.9 g/dL (ref 6.5–8.1)

## 2022-10-29 LAB — CBC WITH DIFFERENTIAL/PLATELET
Abs Immature Granulocytes: 0.06 10*3/uL (ref 0.00–0.07)
Basophils Absolute: 0 10*3/uL (ref 0.0–0.1)
Basophils Relative: 0 %
Eosinophils Absolute: 0 10*3/uL (ref 0.0–0.5)
Eosinophils Relative: 0 %
HCT: 40.1 % (ref 36.0–46.0)
Hemoglobin: 13 g/dL (ref 12.0–15.0)
Immature Granulocytes: 1 %
Lymphocytes Relative: 24 %
Lymphs Abs: 2.9 10*3/uL (ref 0.7–4.0)
MCH: 28.2 pg (ref 26.0–34.0)
MCHC: 32.4 g/dL (ref 30.0–36.0)
MCV: 87 fL (ref 80.0–100.0)
Monocytes Absolute: 0.7 10*3/uL (ref 0.1–1.0)
Monocytes Relative: 6 %
Neutro Abs: 8.2 10*3/uL — ABNORMAL HIGH (ref 1.7–7.7)
Neutrophils Relative %: 69 %
Platelets: 199 10*3/uL (ref 150–400)
RBC: 4.61 MIL/uL (ref 3.87–5.11)
RDW: 14.5 % (ref 11.5–15.5)
WBC: 12 10*3/uL — ABNORMAL HIGH (ref 4.0–10.5)
nRBC: 0 % (ref 0.0–0.2)

## 2022-10-29 LAB — RESP PANEL BY RT-PCR (RSV, FLU A&B, COVID)  RVPGX2
Influenza A by PCR: NEGATIVE
Influenza B by PCR: NEGATIVE
Resp Syncytial Virus by PCR: NEGATIVE
SARS Coronavirus 2 by RT PCR: NEGATIVE

## 2022-10-29 LAB — BRAIN NATRIURETIC PEPTIDE: B Natriuretic Peptide: 31.3 pg/mL (ref 0.0–100.0)

## 2022-10-29 MED ORDER — LACTATED RINGERS IV SOLN: INTRAVENOUS | Status: AC

## 2022-10-29 MED ORDER — IOHEXOL 350 MG/ML SOLN
75.0000 mL | Freq: Once | INTRAVENOUS | Status: AC | PRN
  Administered 2022-10-29: 75 mL via INTRAVENOUS

## 2022-10-29 MED ORDER — IPRATROPIUM-ALBUTEROL 0.5-2.5 (3) MG/3ML IN SOLN
3.0000 mL | Freq: Once | RESPIRATORY_TRACT | Status: AC
  Administered 2022-10-29: 3 mL via RESPIRATORY_TRACT
  Filled 2022-10-29: qty 3

## 2022-10-29 MED ORDER — SODIUM CHLORIDE 0.9 % IV SOLN
2.0000 g | INTRAVENOUS | Status: DC
  Administered 2022-10-30 – 2022-10-31 (×2): 2 g via INTRAVENOUS
  Filled 2022-10-29 (×2): qty 20

## 2022-10-29 MED ORDER — ONDANSETRON HCL 4 MG/2ML IJ SOLN: 4.0000 mg | Freq: Four times a day (QID) | INTRAMUSCULAR | Status: DC | PRN

## 2022-10-29 MED ORDER — INSULIN ASPART 100 UNIT/ML IJ SOLN
6.0000 [IU] | Freq: Once | INTRAMUSCULAR | Status: AC
  Administered 2022-10-30: 6 [IU] via SUBCUTANEOUS
  Filled 2022-10-29: qty 1

## 2022-10-29 MED ORDER — PREDNISONE 20 MG PO TABS
40.0000 mg | ORAL_TABLET | Freq: Every day | ORAL | Status: DC
  Administered 2022-10-31: 40 mg via ORAL
  Filled 2022-10-29: qty 2

## 2022-10-29 MED ORDER — ALBUTEROL SULFATE (2.5 MG/3ML) 0.083% IN NEBU: 2.5000 mg | INHALATION_SOLUTION | RESPIRATORY_TRACT | Status: DC | PRN

## 2022-10-29 MED ORDER — BUSPIRONE HCL 5 MG PO TABS
5.0000 mg | ORAL_TABLET | Freq: Two times a day (BID) | ORAL | Status: DC
  Administered 2022-10-30 – 2022-10-31 (×4): 5 mg via ORAL
  Filled 2022-10-29 (×4): qty 1

## 2022-10-29 MED ORDER — ACETAMINOPHEN 325 MG PO TABS: 650.0000 mg | ORAL_TABLET | Freq: Four times a day (QID) | ORAL | Status: DC | PRN

## 2022-10-29 MED ORDER — METHYLPREDNISOLONE SODIUM SUCC 125 MG IJ SOLR
125.0000 mg | Freq: Once | INTRAMUSCULAR | Status: AC
  Administered 2022-10-29: 125 mg via INTRAVENOUS
  Filled 2022-10-29: qty 2

## 2022-10-29 MED ORDER — SODIUM CHLORIDE 0.9 % IV SOLN
2.0000 g | Freq: Once | INTRAVENOUS | Status: AC
  Administered 2022-10-29: 2 g via INTRAVENOUS
  Filled 2022-10-29: qty 12.5

## 2022-10-29 MED ORDER — CITALOPRAM HYDROBROMIDE 20 MG PO TABS
20.0000 mg | ORAL_TABLET | Freq: Every day | ORAL | Status: DC
  Administered 2022-10-30 – 2022-10-31 (×2): 20 mg via ORAL
  Filled 2022-10-29 (×2): qty 1

## 2022-10-29 MED ORDER — ACETAMINOPHEN 650 MG RE SUPP: 650.0000 mg | Freq: Four times a day (QID) | RECTAL | Status: DC | PRN

## 2022-10-29 MED ORDER — SODIUM CHLORIDE 0.9 % IV SOLN: Freq: Once | INTRAVENOUS | Status: DC

## 2022-10-29 MED ORDER — SIMVASTATIN 10 MG PO TABS
20.0000 mg | ORAL_TABLET | Freq: Every day | ORAL | Status: DC
  Administered 2022-10-30: 20 mg via ORAL
  Filled 2022-10-29: qty 2

## 2022-10-29 MED ORDER — NICOTINE 14 MG/24HR TD PT24
14.0000 mg | MEDICATED_PATCH | Freq: Every day | TRANSDERMAL | Status: DC
  Filled 2022-10-29 (×2): qty 1

## 2022-10-29 MED ORDER — INSULIN GLARGINE-YFGN 100 UNIT/ML ~~LOC~~ SOLN
20.0000 [IU] | Freq: Two times a day (BID) | SUBCUTANEOUS | Status: DC
  Administered 2022-10-30 (×3): 20 [IU] via SUBCUTANEOUS
  Filled 2022-10-29 (×3): qty 0.2

## 2022-10-29 MED ORDER — ONDANSETRON HCL 4 MG PO TABS: 4.0000 mg | ORAL_TABLET | Freq: Four times a day (QID) | ORAL | Status: DC | PRN

## 2022-10-29 MED ORDER — METHYLPREDNISOLONE SODIUM SUCC 40 MG IJ SOLR
40.0000 mg | Freq: Two times a day (BID) | INTRAMUSCULAR | Status: AC
  Administered 2022-10-30 (×2): 40 mg via INTRAVENOUS
  Filled 2022-10-29 (×2): qty 1

## 2022-10-29 MED ORDER — CLONAZEPAM 0.5 MG PO TABS: 0.5000 mg | ORAL_TABLET | Freq: Two times a day (BID) | ORAL | Status: DC | PRN

## 2022-10-29 MED ORDER — DM-GUAIFENESIN ER 30-600 MG PO TB12
1.0000 | ORAL_TABLET | Freq: Two times a day (BID) | ORAL | Status: DC
  Administered 2022-10-30 – 2022-10-31 (×3): 1 via ORAL
  Filled 2022-10-29 (×3): qty 1

## 2022-10-29 MED ORDER — SODIUM CHLORIDE 0.9 % IV SOLN: INTRAVENOUS | Status: DC

## 2022-10-29 MED ORDER — HYDROCODONE-ACETAMINOPHEN 5-325 MG PO TABS: 1.0000 | ORAL_TABLET | ORAL | Status: DC | PRN

## 2022-10-29 MED ORDER — INSULIN ASPART 100 UNIT/ML IJ SOLN
0.0000 [IU] | Freq: Every day | INTRAMUSCULAR | Status: DC
  Administered 2022-10-30: 4 [IU] via SUBCUTANEOUS
  Filled 2022-10-29: qty 1

## 2022-10-29 MED ORDER — SODIUM CHLORIDE 0.9 % IV SOLN
500.0000 mg | INTRAVENOUS | Status: DC
  Administered 2022-10-30 (×2): 500 mg via INTRAVENOUS
  Filled 2022-10-29 (×2): qty 5

## 2022-10-29 MED ORDER — IPRATROPIUM-ALBUTEROL 0.5-2.5 (3) MG/3ML IN SOLN
3.0000 mL | Freq: Four times a day (QID) | RESPIRATORY_TRACT | Status: DC
  Administered 2022-10-30 – 2022-10-31 (×7): 3 mL via RESPIRATORY_TRACT
  Filled 2022-10-29 (×6): qty 3

## 2022-10-29 MED ORDER — SODIUM CHLORIDE 0.9 % IV SOLN: 500.0000 mg | Freq: Once | INTRAVENOUS | Status: DC

## 2022-10-29 MED ORDER — SODIUM CHLORIDE 0.9 % IV BOLUS
500.0000 mL | Freq: Once | INTRAVENOUS | Status: AC
  Administered 2022-10-29: 500 mL via INTRAVENOUS

## 2022-10-29 MED ORDER — INSULIN ASPART 100 UNIT/ML IJ SOLN
0.0000 [IU] | Freq: Three times a day (TID) | INTRAMUSCULAR | Status: DC
  Administered 2022-10-30 (×3): 11 [IU] via SUBCUTANEOUS
  Administered 2022-10-31: 15 [IU] via SUBCUTANEOUS
  Administered 2022-10-31: 11 [IU] via SUBCUTANEOUS
  Filled 2022-10-29 (×6): qty 1

## 2022-10-29 MED ORDER — ENOXAPARIN SODIUM 60 MG/0.6ML IJ SOSY
0.5000 mg/kg | PREFILLED_SYRINGE | INTRAMUSCULAR | Status: DC
  Administered 2022-10-30 – 2022-10-31 (×2): 42.5 mg via SUBCUTANEOUS
  Filled 2022-10-29 (×2): qty 0.6

## 2022-10-29 MED ORDER — BENZONATATE 100 MG PO CAPS: 100.0000 mg | ORAL_CAPSULE | Freq: Three times a day (TID) | ORAL | Status: DC | PRN

## 2022-10-29 NOTE — Assessment & Plan Note (Signed)
Oral repletion x 1 with KCl 40 mEq

## 2022-10-29 NOTE — Assessment & Plan Note (Signed)
Holding home antihypertensives due to soft blood pressures

## 2022-10-29 NOTE — ED Triage Notes (Signed)
  States she has been short of breath for the pass week and has been having flu like symptoms. EMS states she was 89% room air on arrival. Placed patient on a NRB at 15 L

## 2022-10-29 NOTE — Assessment & Plan Note (Signed)
Blood sugar 223 Continue basal insulin Sliding scale coverage

## 2022-10-29 NOTE — ED Provider Notes (Signed)
[facility] Provider Note    Event Date/Time   First MD Initiated Contact with Patient 10/29/22 2100     (approximate)   History   Shortness of Breath (States she has been short of breath for the pass week and has been having flu like symptoms. EMS states she was 89% room air on arrival. Placed patient on a NRB at 15 L.  )   HPI  Brittany Pruitt is a 61 y.o. female who presents to the ER for evaluation of shortness of breath over the past 2 to 3 days.  Recent exposure to family member living at home with her with flulike illness.  Patient does have long history of smoking does not wear home oxygen.  No recent antibiotics.  Has had poor p.o. intake.  Denies any abdominal pain.     Physical Exam   Triage Vital Signs: ED Triage Vitals  Enc Vitals Group     BP 10/29/22 2057 (!) 102/56     Pulse Rate 10/29/22 2057 79     Resp 10/29/22 2057 20     Temp 10/29/22 2057 98.5 F (36.9 C)     Temp Source 10/29/22 2057 Oral     SpO2 10/29/22 2053 98 %     Weight 10/29/22 2059 192 lb (87.1 kg)     Height 10/29/22 2059 '5\' 4"'$  (1.626 m)     Head Circumference --      Peak Flow --      Pain Score 10/29/22 2058 0     Pain Loc --      Pain Edu? --      Excl. in [place]? --     Most recent vital signs: Vitals:   10/29/22 2057 10/29/22 2200  BP: (!) 102/56 (!) 95/56  Pulse: 79 85  Resp: 20 (!) 22  Temp: 98.5 F (36.9 C)   SpO2: 93% 93%     Constitutional: Alert  Eyes: Conjunctivae are normal.  Head: Atraumatic. Nose: No congestion/rhinnorhea. Mouth/Throat: Mucous membranes are moist.   Neck: Painless ROM.  Cardiovascular:   Good peripheral circulation. Respiratory: Normal respiratory effort.  No retractions.  Gastrointestinal: Soft and nontender.  Musculoskeletal:  no deformity Neurologic:  MAE spontaneously. No gross focal neurologic deficits are appreciated.  Skin:  Skin is warm, dry and intact. No rash noted. Psychiatric: Mood and affect are normal.  Speech and behavior are normal.    ED Results / Procedures / Treatments   Labs (all labs ordered are listed, but only abnormal results are displayed) Labs Reviewed  COMPREHENSIVE METABOLIC PANEL - Abnormal; Notable for the following components:      Result Value   Sodium 133 (*)    Potassium 3.3 (*)    Glucose, Bld 223 (*)    Calcium 8.4 (*)    Albumin 3.3 (*)    Alkaline Phosphatase 35 (*)    All other components within normal limits  CBC WITH DIFFERENTIAL/PLATELET - Abnormal; Notable for the following components:   WBC 12.0 (*)    Neutro Abs 8.2 (*)    All other components within normal limits  RESP PANEL BY RT-PCR (RSV, FLU A&B, COVID)  [id number]  CULTURE, BLOOD (ROUTINE X 2)  CULTURE, BLOOD (ROUTINE X 2)  LACTIC ACID, PLASMA  BRAIN NATRIURETIC PEPTIDE  LACTIC ACID, PLASMA  URINALYSIS, COMPLETE (UACMP) WITH MICROSCOPIC     EKG  ED ECG REPORT I, Merlyn Lot, the attending physician, personally viewed and interpreted this ECG.   Date:  10/29/2022  EKG Time: 20:55  Rate: 80  Rhythm: sinus  Axis: left  Intervals:normal qt  ST&T Change: no stemi, no depressions    RADIOLOGY Please see ED Course for my review and interpretation.  I personally reviewed all radiographic images ordered to evaluate for the above acute complaints and reviewed radiology reports and findings.  These findings were personally discussed with the patient.  Please see medical record for radiology report.    PROCEDURES:  Critical Care performed: yes  .Critical Care  Performed by: Merlyn Lot, MD Authorized by: Merlyn Lot, MD   Critical care provider statement:    Critical care time (minutes):  40   Critical care was necessary to treat or prevent imminent or life-threatening deterioration of the following conditions:  Respiratory failure   Critical care was time spent personally by me on the following activities:  Ordering and performing treatments and interventions,  ordering and review of laboratory studies, ordering and review of radiographic studies, pulse oximetry, re-evaluation of patient's condition, review of old charts, obtaining history from patient or surrogate, examination of patient, evaluation of patient's response to treatment, discussions with primary provider, discussions with consultants and development of treatment plan with patient or surrogate    MEDICATIONS ORDERED IN ED: Medications  lactated ringers infusion (has no administration in time range)  ceFEPIme (MAXIPIME) 2 g in sodium chloride 0.9 % 100 mL IVPB (has no administration in time range)  azithromycin (ZITHROMAX) 500 mg in sodium chloride 0.9 % 250 mL IVPB (has no administration in time range)  insulin aspart (novoLOG) injection 6 Units (has no administration in time range)  0.9 %  sodium chloride infusion (has no administration in time range)  ipratropium-albuterol (DUONEB) 0.5-2.5 (3) MG/3ML nebulizer solution 3 mL (3 mLs Nebulization Given 10/29/22 2113)  ipratropium-albuterol (DUONEB) 0.5-2.5 (3) MG/3ML nebulizer solution 3 mL (3 mLs Nebulization Given 10/29/22 2112)  methylPREDNISolone sodium succinate (SOLU-MEDROL) 125 mg/2 mL injection 125 mg (125 mg Intravenous Given 10/29/22 2113)  iohexol (OMNIPAQUE) 350 MG/ML injection 75 mL (75 mLs Intravenous Contrast Given 10/29/22 2236)  sodium chloride 0.9 % bolus 500 mL (500 mLs Intravenous New Bag/Given 10/29/22 2250)     IMPRESSION / MDM / [place] / ED COURSE  I reviewed the triage vital signs and the nursing notes.                              Differential diagnosis includes, but is not limited to, Asthma, copd, CHF, pna, ptx, malignancy, Pe, anemia  Patient presenting to the ER for evaluation of symptoms as described above.  Based on symptoms, risk factors and considered above differential, this presenting complaint could reflect a potentially life-threatening illness therefore the patient will be placed on  continuous pulse oximetry and telemetry for monitoring.  Laboratory evaluation will be sent to evaluate for the above complaints.  Patient on supplemental oxygen is currently afebrile will give nebulizer treatments as well as Solu-Medrol as I do appreciate significant diffuse wheezing.  High suspicion for viral illness.  Will order septic workup.   Clinical Course as of 10/29/22 2321  Tue Oct 29, 2022  2236 Chest x-ray on my review and interpretation without evidence of pneumothorax. [PR]  2244 Given the patient's level of hypoxia I have ordered CT imaging to further evaluate her stratify for PE. [PR]  2257 CTA on my review and interpretation does not appear consistent with PE. [PR]  2316 CT imaging  radiology report concerning for multifocal pneumonia given her acute hypoxia will consult hospitalist for admission.  Have ordered broad-spectrum IV antibiotics.  Will give subcu insulin for hyperglycemia.. [PR]    Clinical Course User Index [PR] Merlyn Lot, MD    FINAL CLINICAL IMPRESSION(S) / ED DIAGNOSES   Final diagnoses:  Acute respiratory failure with hypoxia [facility])  Multifocal pneumonia     Rx / DC Orders   ED Discharge Orders     None        Note:  This document was prepared using Dragon voice recognition software and may include unintentional dictation errors.    Merlyn Lot, MD 10/29/22 [phone]

## 2022-10-29 NOTE — H&P (Signed)
History and Physical    Patient: Brittany Pruitt MWN:[id number] DOB: 15-Mar-1961 DOA: 10/29/2022 DOS: the patient was seen and examined on 10/29/2022 PCP: [name], NP  Patient coming from: Home  Chief Complaint:  Chief Complaint  Patient presents with   Shortness of Breath    States she has been short of breath for the pass week and has been having flu like symptoms. EMS states she was 89% room air on arrival. Placed patient on a NRB at 15 L.      HPI: Brittany Pruitt is a 61 y.o. female with medical history significant for DM, HTN, COPD who presents to the ED with a 1 week history of cough, congestion and wheezing.  She presented by EMS who reported an O2 sat of 89% on room air placing her on NRB at 15 L for transport.  Patient denies chest pain, fever or chills, lower extremity pain or swelling.  She reports decreased appetite but she has had no vomiting diarrhea or abdominal pain.  She has been in contact with someone with a flulike illness. ED course and dataReview: BP 95/56 with pulse 85 respirations 22 and O2 sat 93% on NRB, and afebrile.  Respiratory viral panel negative for COVID flu and RSV.  WBC 12,000 with lactic acid 1.2.  BNP 31.3.  Other labs remarkable for potassium 3.3 and glucose 223.  EKG, personally reviewed and interpreted showing NSR at 78 with no ischemic ST-T wave changes.  CTA PE protocol negative for PE but showed ""Patchy airspace opacities bilaterally likely related to atypical pneumonia. Some inspissated material is noted within the lower lobe bronchial tree likely related to mucous. as well as an 8 mm solid pulmonary nodule". Patient was treated with DuoNebs and Solu-Medrol and given a fluid bolus and was weaned to O2 at 4 L by admission.  Hospitalist consulted for admission.   Review of Systems: As mentioned in the history of present illness. All other systems reviewed and are negative.  Past Medical History:  Diagnosis Date   Anxiety    Chicken pox     Depression    Diabetes mellitus without complication ([place])    Frequent headaches    Hyperlipidemia    Past Surgical History:  Procedure Laterality Date   ABDOMINAL HYSTERECTOMY  2013   total   BREAST BIOPSY Left 2013   benign   CESAREAN SECTION     COLONOSCOPY WITH PROPOFOL N/A 02/09/2016   Procedure: COLONOSCOPY WITH PROPOFOL;  Surgeon: Hulen Luster, MD;  Location: [facility] ENDOSCOPY;  Service: Gastroenterology;  Laterality: N/A;   TONSILLECTOMY AND ADENOIDECTOMY     Social History:  reports that she has been smoking cigarettes. She has a 30.00 pack-year smoking history. She has never used smokeless tobacco. She reports that she does not drink alcohol and does not use drugs.  Allergies  Allergen Reactions   Sulfa Antibiotics Hives    Family History  Problem Relation Age of Onset   Uterine cancer Mother    Breast cancer Mother 70   Pancreatic cancer Mother    Heart disease Father    Hypertension Father    Anxiety disorder Father    Diabetes Father    Uterine cancer Maternal Aunt    Breast cancer Maternal Aunt 2   Heart disease Paternal Grandfather     Prior to Admission medications   Medication Sig Start Date End Date Taking? Authorizing Provider  acetaminophen (TYLENOL) 500 MG tablet Take 500 mg by mouth every 6 (six) hours  as needed for moderate pain.   Yes [provider]  benzonatate (TESSALON) 100 MG capsule Take 1 capsule (100 mg total) by mouth 3 (three) times daily as needed. 10/24/22  Yes Burnette, Selders [place] M, PA-C  clonazePAM (KLONOPIN) 0.5 MG tablet TAKE ONE-HALF (1/2) TO ONE TABLET TWICE A DAY AS NEEDED FOR ANXIETY 07/19/22  Yes [name], [name], NP  dextromethorphan-guaiFENesin (MUCINEX DM) 30-600 MG 12hr tablet Take 1 tablet by mouth 2 (two) times daily.   Yes [provider]  busPIRone (BUSPAR) 5 MG tablet Take 1 tablet (5 mg total) by mouth 2 (two) times daily. 09/06/22   [name], NP  citalopram (CELEXA) 20 MG tablet Take 1 tablet (20 mg  total) by mouth daily. 09/06/22   [name], NP  gabapentin (NEURONTIN) 300 MG capsule Take 1 capsule (300 mg total) by mouth 4 (four) times daily. 09/06/22   [name], NP  glipiZIDE (GLUCOTROL) 10 MG tablet TAKE 1 TABLET BY MOUTH TWICE DAILY BEFORE A MEAL 09/06/22   [name], [name], NP  insulin glargine (LANTUS SOLOSTAR) 100 UNIT/ML Solostar Pen Inject 35 Units into the skin 2 (two) times daily. 06/10/22 10/08/23  [name], NP  lisinopril (ZESTRIL) 5 MG tablet Take 1 tablet (5 mg total) by mouth daily. 09/06/22   [name], NP  meclizine (ANTIVERT) 25 MG tablet TAKE 1 TABLET THREE TIMES A DAY AS NEEDED FOR DIZZINESS 09/06/22   [name], NP  Multiple Vitamins-Minerals (CENTRUM SILVER 50+WOMEN PO) Take 1 tablet by mouth daily.    [provider]  Omega-3 Fatty Acids (FISH OIL) 1000 MG CAPS Take 1 capsule by mouth in the morning and at bedtime.    [provider]  omeprazole (PRILOSEC) 20 MG capsule Take 1 capsule (20 mg total) by mouth daily. 09/06/22   [name], NP  promethazine-dextromethorphan (PROMETHAZINE-DM) 6.25-15 MG/5ML syrup Take 5 mLs by mouth 4 (four) times daily as needed. 10/24/22   Mar Daring, PA-C  Semaglutide,0.25 or 0.'5MG'$ /DOS, (OZEMPIC, 0.25 OR 0.5 MG/DOSE,) 2 MG/1.5ML SOPN Inject into the skin. 0.25 mg once weekly for 4 weeks, then will increase to 0.5 mg weekly    [provider]  simvastatin (ZOCOR) 20 MG tablet TAKE 1 TABLET BY MOUTH ONCE DAILY AT  6PM 09/06/22   [name], NP    Physical Exam: Vitals:   10/29/22 2056 10/29/22 2057 10/29/22 2059 10/29/22 2200  BP:  (!) 102/56  (!) 95/56  Pulse:  79  85  Resp:  20  (!) 22  Temp:  98.5 F (36.9 C)    TempSrc:  Oral    SpO2: (!) 83% 93%  93%  Weight:   87.1 kg   Height:   '5\' 4"'$  (1.626 m)    Physical Exam Vitals and nursing note reviewed.  Constitutional:      General: She is not in acute distress.    Interventions: Nasal cannula in place.      Comments: Tachypneic, speaking in short sentences  HENT:     Head: Normocephalic and atraumatic.  Cardiovascular:     Rate and Rhythm: Normal rate and regular rhythm.     Heart sounds: Normal heart sounds.  Pulmonary:     Effort: Pulmonary effort is normal. Tachypnea present.     Breath sounds: Normal breath sounds.  Abdominal:     Palpations: Abdomen is soft.     Tenderness: There is no abdominal tenderness.  Neurological:  Mental Status: Mental status is at baseline.     Labs on Admission: I have personally reviewed following labs and imaging studies  CBC: Recent Labs  Lab 10/29/22 2058  WBC 12.0*  NEUTROABS 8.2*  HGB 13.0  HCT 40.1  MCV 87.0  PLT 914   Basic Metabolic Panel: Recent Labs  Lab 10/29/22 2058  NA 133*  K 3.3*  CL 98  CO2 25  GLUCOSE 223*  BUN 20  CREATININE 0.83  CALCIUM 8.4*   GFR: Estimated Creatinine Clearance: 74.1 mL/min (by C-G formula based on SCr of 0.83 mg/dL). Liver Function Tests: Recent Labs  Lab 10/29/22 2058  AST 35  ALT 21  ALKPHOS 35*  BILITOT 1.2  PROT 6.9  ALBUMIN 3.3*   No results for input(s): "LIPASE", "AMYLASE" in the last 168 hours. No results for input(s): "AMMONIA" in the last 168 hours. Coagulation Profile: No results for input(s): "INR", "PROTIME" in the last 168 hours. Cardiac Enzymes: No results for input(s): "CKTOTAL", "CKMB", "CKMBINDEX", "TROPONINI" in the last 168 hours. BNP (last 3 results) No results for input(s): "PROBNP" in the last 8760 hours. HbA1C: No results for input(s): "HGBA1C" in the last 72 hours. CBG: No results for input(s): "GLUCAP" in the last 168 hours. Lipid Profile: No results for input(s): "CHOL", "HDL", "LDLCALC", "TRIG", "CHOLHDL", "LDLDIRECT" in the last 72 hours. Thyroid Function Tests: No results for input(s): "TSH", "T4TOTAL", "FREET4", "T3FREE", "THYROIDAB" in the last 72 hours. Anemia Panel: No results for input(s): "VITAMINB12", "FOLATE", "FERRITIN", "TIBC",  "IRON", "RETICCTPCT" in the last 72 hours. Urine analysis: No results found for: "COLORURINE", "APPEARANCEUR", "LABSPEC", "PHURINE", "GLUCOSEU", "HGBUR", "BILIRUBINUR", "KETONESUR", "PROTEINUR", "UROBILINOGEN", "NITRITE", "LEUKOCYTESUR"  Radiological Exams on Admission: CT Angio Chest PE W and/or Wo Contrast  Result Date: 10/29/2022 CLINICAL DATA:  Shortness of breath for 1 week EXAM: CT ANGIOGRAPHY CHEST WITH CONTRAST TECHNIQUE: Multidetector CT imaging of the chest was performed using the standard protocol during bolus administration of intravenous contrast. Multiplanar CT image reconstructions and MIPs were obtained to evaluate the vascular anatomy. RADIATION DOSE REDUCTION: This exam was performed according to the departmental dose-optimization program which includes automated exposure control, adjustment of the mA and/or kV according to patient size and/or use of iterative reconstruction technique. CONTRAST:  60m OMNIPAQUE IOHEXOL 350 MG/ML SOLN COMPARISON:  Chest x-ray from earlier in the same day. FINDINGS: Cardiovascular: Atherosclerotic calcifications of the thoracic aorta are noted. Dilatation of the ascending aorta to 4 cm is noted. No evidence of dissection is seen. Heart is at the upper limits of normal in size. Pulmonary artery shows a normal branching pattern bilaterally. No filling defect to suggest pulmonary embolism is noted. Mediastinum/Nodes: Thoracic inlet is within normal limits. Scattered small mediastinal lymph nodes are noted. Slightly more marked hilar lymph nodes are noted bilaterally likely reactive in nature. The esophagus is within normal limits with the exception of a small sliding-type hiatal hernia. Lungs/Pleura: Lungs are well aerated bilaterally. Considerable inspissated material is noted particularly in the lower lobe bronchial tree likely related to mucous. Patchy airspace opacity is noted primarily within the right upper and bilateral lower lobes. 8 mm nodule is noted  along the minor fissure anteriorly. No sizable effusion or pneumothorax is noted. Upper Abdomen: Visualized upper abdomen is unremarkable. Musculoskeletal: Degenerative changes of the thoracic spine are noted. Review of the MIP images confirms the above findings. IMPRESSION: No evidence of pulmonary emboli. Patchy airspace opacities bilaterally likely related to atypical pneumonia. Some inspissated material is noted within the lower lobe bronchial  tree likely related to mucous. Dilatation of the ascending aorta to 4 cm. Recommend annual imaging followup by CTA or MRA. This recommendation follows 2010 ACCF/AHA/AATS/ACR/ASA/SCA/SCAI/SIR/STS/SVM Guidelines for the Diagnosis and Management of Patients with Thoracic Aortic Disease. Circulation. 2010; 121: Z610-R604. Aortic aneurysm NOS (ICD10-I71.9) 8 mm solid pulmonary nodule. Per Fleischner Society Guidelines, recommend a non-contrast Chest CT at 6-12 months. If patient is high risk for malignancy, consider an additional non-contrast Chest CT at 18-24 months. If patient is low risk for malignancy, non-contrast Chest CT at 18-24 months is optional. These guidelines do not apply to immunocompromised patients and patients with cancer. Follow up in patients with significant comorbidities as clinically warranted. For lung cancer screening, adhere to Lung-RADS guidelines. Reference: Radiology. 2017; 284(1):228-43. Aortic Atherosclerosis (ICD10-I70.0). Electronically Signed   By: Inez Catalina M.D.   On: 10/29/2022 23:07   DG Chest Port 1 View  Result Date: 10/29/2022 CLINICAL DATA:  Shortness of breath and flu-like symptoms. Oxygen saturation 89% on room air on arrival. EXAM: PORTABLE CHEST 1 VIEW COMPARISON:  None Available. FINDINGS: In Atherosclerotic calcification of the aortic arch. Heart size within normal limits. Bilateral airway thickening noted with bilateral interstitial accentuation, slightly more notable on the right than the left. Accentuated right  infrahilar density probably related to atelectasis or pneumonia. Mild bilateral hilar prominence may reflect reactive adenopathy; no paratracheal density to suggest paratracheal adenopathy. No blunting of the costophrenic angles. No significant bony abnormality identified. IMPRESSION: 1. Airway thickening with bilateral interstitial accentuation, slightly more notable on the right than the left, compatible with noncardiogenic edema or atypical pneumonia. 2. Accentuated right infrahilar density probably related to atelectasis or pneumonia. 3. Mild bilateral hilar prominence may reflect reactive adenopathy. 4. Atherosclerotic calcification of the aortic arch. 5. If the patient has a history of smoking or other risk factors for lung cancer, consider chest CT. Electronically Signed   By: Van Clines M.D.   On: 10/29/2022 21:59     Data Reviewed: Relevant notes from primary care and specialist visits, past discharge summaries as available in EHR, including Care Everywhere. Prior diagnostic testing as pertinent to current admission diagnoses Updated medications and problem lists for reconciliation ED course, including vitals, labs, imaging, treatment and response to treatment Triage notes, nursing and pharmacy notes and ED provider's notes Notable results as noted in HPI   Assessment and Plan: * Pneumonia COPD exacerbation Acute respiratory failure with hypoxia Several day history of cough and congestion with CT chest showing the following "Patchy airspace opacities bilaterally likely related to atypical pneumonia. Some inspissated material is noted within the lower lobe bronchial tree likely related to mucous". Respiratory viral panel negative for COVID flu and RSV Continue Rocephin and azithromycin Scheduled and as needed nebulizers IV steroids Antitussives, flutter valve Supplemental oxygen and wean as tolerated  Hypotension Soft BP of 95/56.  Sepsis not suspected at this time but  continue to monitor Hold home lisinopril IV hydration  Uncontrolled type 2 diabetes mellitus with hyperglycemia, with long-term current use of insulin (HCC) Blood sugar 223 Continue basal insulin Sliding scale coverage  Hypokalemia Oral repletion x 1 with KCl 40 mEq  Pulmonary nodule seen on imaging study Outpatient surveillance as recommended by radiology  HTN (hypertension) Holding home antihypertensives due to soft blood pressures  Anxiety and depression Continue home buspirone and citalopram        DVT prophylaxis: Lovenox  Consults: none  Advance Care Planning: full code  Family Communication: none  Disposition Plan: Back to previous home  environment  Severity of Illness: The appropriate patient status for this patient is INPATIENT. Inpatient status is judged to be reasonable and necessary in order to provide the required intensity of service to ensure the patient's safety. The patient's presenting symptoms, physical exam findings, and initial radiographic and laboratory data in the context of their chronic comorbidities is felt to place them at high risk for further clinical deterioration. Furthermore, it is not anticipated that the patient will be medically stable for discharge from the hospital within 2 midnights of admission.   * I certify that at the point of admission it is my clinical judgment that the patient will require inpatient hospital care spanning beyond 2 midnights from the point of admission due to high intensity of service, high risk for further deterioration and high frequency of surveillance required.*  Author: Athena Masse, MD 10/29/2022 11:41 PM  For on call review [email or web address].

## 2022-10-29 NOTE — Assessment & Plan Note (Signed)
Outpatient surveillance as recommended by radiology

## 2022-10-29 NOTE — Assessment & Plan Note (Signed)
Soft BP of 95/56.  Sepsis not suspected at this time but continue to monitor Hold home lisinopril IV hydration

## 2022-10-29 NOTE — Assessment & Plan Note (Signed)
Continue home buspirone and citalopram

## 2022-10-29 NOTE — Assessment & Plan Note (Signed)
COPD exacerbation Acute respiratory failure with hypoxia Several day history of cough and congestion with CT chest showing the following "Patchy airspace opacities bilaterally likely related to atypical pneumonia. Some inspissated material is noted within the lower lobe bronchial tree likely related to mucous". Respiratory viral panel negative for COVID flu and RSV Continue Rocephin and azithromycin Scheduled and as needed nebulizers IV steroids Antitussives, flutter valve Supplemental oxygen and wean as tolerated

## 2022-10-30 DIAGNOSIS — J9601 Acute respiratory failure with hypoxia: Secondary | ICD-10-CM | POA: Diagnosis not present

## 2022-10-30 DIAGNOSIS — E876 Hypokalemia: Secondary | ICD-10-CM | POA: Diagnosis not present

## 2022-10-30 DIAGNOSIS — J441 Chronic obstructive pulmonary disease with (acute) exacerbation: Secondary | ICD-10-CM | POA: Diagnosis not present

## 2022-10-30 DIAGNOSIS — E1165 Type 2 diabetes mellitus with hyperglycemia: Secondary | ICD-10-CM

## 2022-10-30 DIAGNOSIS — Z794 Long term (current) use of insulin: Secondary | ICD-10-CM

## 2022-10-30 DIAGNOSIS — F32A Depression, unspecified: Secondary | ICD-10-CM

## 2022-10-30 DIAGNOSIS — F419 Anxiety disorder, unspecified: Secondary | ICD-10-CM | POA: Diagnosis not present

## 2022-10-30 DIAGNOSIS — J189 Pneumonia, unspecified organism: Principal | ICD-10-CM

## 2022-10-30 LAB — MAGNESIUM: Magnesium: 1.9 mg/dL (ref 1.7–2.4)

## 2022-10-30 LAB — URINALYSIS, COMPLETE (UACMP) WITH MICROSCOPIC
Bacteria, UA: NONE SEEN
Bilirubin Urine: NEGATIVE
Glucose, UA: >=500 mg/dL — AB
Ketones, ur: 20 mg/dL — AB
Leukocytes,Ua: NEGATIVE
Nitrite: NEGATIVE
Protein, ur: 30 mg/dL — AB
Specific Gravity, Urine: 1.03 (ref 1.005–1.030)
pH: 6 (ref 5.0–8.0)

## 2022-10-30 LAB — CBG MONITORING, ED
Glucose-Capillary: 326 mg/dL — ABNORMAL HIGH (ref 70–99)
Glucose-Capillary: 345 mg/dL — ABNORMAL HIGH (ref 70–99)
Glucose-Capillary: 318 mg/dL — ABNORMAL HIGH (ref 70–99)
Glucose-Capillary: 355 mg/dL — ABNORMAL HIGH (ref 70–99)
Glucose-Capillary: 316 mg/dL — ABNORMAL HIGH (ref 70–99)

## 2022-10-30 LAB — BASIC METABOLIC PANEL
Anion gap: 9 (ref 5–15)
BUN: 15 mg/dL (ref 8–23)
CO2: 24 mmol/L (ref 22–32)
Calcium: 8.1 mg/dL — ABNORMAL LOW (ref 8.9–10.3)
Chloride: 101 mmol/L (ref 98–111)
Creatinine, Ser: 0.7 mg/dL (ref 0.44–1.00)
GFR, Estimated: >60 mL/min (ref >60)
Glucose, Bld: 317 mg/dL — ABNORMAL HIGH (ref 70–99)
Potassium: 3.3 mmol/L — ABNORMAL LOW (ref 3.5–5.1)
Sodium: 134 mmol/L — ABNORMAL LOW (ref 135–145)

## 2022-10-30 LAB — CBC
HCT: 37.8 % (ref 36.0–46.0)
Hemoglobin: 12.2 g/dL (ref 12.0–15.0)
MCH: 28.5 pg (ref 26.0–34.0)
MCHC: 32.3 g/dL (ref 30.0–36.0)
MCV: 88.3 fL (ref 80.0–100.0)
Platelets: 206 10*3/uL (ref 150–400)
RBC: 4.28 MIL/uL (ref 3.87–5.11)
RDW: 14.3 % (ref 11.5–15.5)
WBC: 12.6 10*3/uL — ABNORMAL HIGH (ref 4.0–10.5)
nRBC: 0 % (ref 0.0–0.2)

## 2022-10-30 LAB — HIV ANTIBODY (ROUTINE TESTING W REFLEX): HIV Screen 4th Generation wRfx: NONREACTIVE

## 2022-10-30 NOTE — ED Notes (Signed)
Pt's family at bedside assisting her with her lunch tray.

## 2022-10-30 NOTE — ED Notes (Signed)
Pt remains on 4L via  [place]; resp reg/unlabored; husband remains at bedside.

## 2022-10-30 NOTE — Progress Notes (Signed)
  Progress Note   Patient: Brittany Pruitt WUJ:[id number] DOB: 08/12/1961 DOA: 10/29/2022     1 DOS: the patient was seen and examined on 10/30/2022   Brief hospital course: Brittany Pruitt is a 61 y.o. female with medical history significant for DM, HTN, COPD who presents to the ED with a 1 week history of cough, congestion and wheezing and was found to have acute hypoxic respiratory failure secondary to pneumonia and COPD exacerbation.    Assessment and Plan: Acute respiratory failure with hypoxia secondary to pneumonia and COPD exacerbation Still requiring 5 L nasal cannula to maintain SpO2 greater than 88%, CTA chest negative for PE.  With diffuse rhonchi wheezes on exam was concerning for COPD flare in the setting of atypical pneumonia - Follow-up strep pneumonia, Legionella -Continue Rocephin and azithromycin -Continue scheduled and as needed nebulizers, Mucinex, flutter valve, incentive spirometry - Start oral prednisone 12/28 Supplemental oxygen and wean as tolerated  Hypotension, improving SBP in the 90s on admission, currently SBP in the 110s Soft BP of 95/56.  Sepsis not suspected at this time but continue to monitor Hold home lisinopril IV hydration  Uncontrolled type 2 diabetes mellitus with hyperglycemia, with long-term current use of insulin (HCC) Likely worse in setting of IV steroid needs for COPD exacerbation and acute infection. Continue to monitor during transition to oral prednisone Blood sugar 223 Continue basal lantus increase from 20 U BID to 25 U BID given BS stilli n 300ss ( home regimen is 35 U BID) Sliding scale coverage Hold home glipizide and semaglutide  Hypokalemia S/p Oral repletion, mg wnl Monitor BMP  Pulmonary nodule seen on imaging study Outpatient surveillance as recommended by radiology  HTN (hypertension) Holding home antihypertensives due to soft blood pressures on admission  Anxiety and depression Continue home buspirone and  citalopram, prn home klonopin        Subjective: Reports feeling a little bit better, still having a bit of a cough, denies any chest pain  Physical Exam: Vitals:   10/30/22 1630 10/30/22 1645 10/30/22 1700 10/30/22 1715  BP: (!) 107/53     Pulse: 66   75  Resp:   (!) 26   Temp:      TempSrc:      SpO2:  96% 94%   Weight:      Height:       Normal respiratory effort, on 5 L nasal cannula, decreased breath sounds lower bases, increased rhonchi and wheezing Regular rate and rhythm, no murmurs appreciated No peripheral edema Soft abdomen, nondistended  Data Reviewed:  There are no new results to review at this time.  Family Communication: Brittany Pruitt updated at bedside  Disposition: Status is: Inpatient Remains inpatient appropriate because: Continue scheduled nebs, IV antibiotics  Planned Discharge Destination:  Will need PT eval to determine once medically stable      Author: Laverna Peace, MD 10/30/2022 5:45 PM  For on call review [email or web address].

## 2022-10-30 NOTE — ED Notes (Signed)
Patient ate entire breakfast, except applesauce.

## 2022-10-30 NOTE — Progress Notes (Signed)
PHARMACIST - PHYSICIAN COMMUNICATION  CONCERNING:  Enoxaparin (Lovenox) for DVT Prophylaxis    RECOMMENDATION: Patient was prescribed enoxaprin '40mg'$  q24 hours for VTE prophylaxis.   Filed Weights   10/29/22 2059  Weight: 87.1 kg (192 lb)    Body mass index is 32.96 kg/m.  Estimated Creatinine Clearance: 74.1 mL/min (by C-G formula based on SCr of 0.83 mg/dL).   Based on [place] patient is candidate for enoxaparin 0.'5mg'$ /kg TBW SQ every 24 hours based on BMI being >30.  DESCRIPTION: Pharmacy has adjusted enoxaparin dose per [facility] policy.  Patient is now receiving enoxaparin 0.5 mg/kg every 24 hours   Renda Rolls, PharmD, [facility] 10/30/2022 12:01 AM

## 2022-10-30 NOTE — ED Notes (Signed)
PT states coming in for shortness of breath. Pt states history of COPD. Pt on cardiac, bp and pulse ox monitoring

## 2022-10-30 NOTE — ED Notes (Signed)
Will send urine sample once fresh urine obtained; what was in purewick canister was collected over entire day per family at bedside.

## 2022-10-30 NOTE — ED Notes (Signed)
Report given to Georgie, RN.

## 2022-10-30 NOTE — ED Notes (Signed)
BG 318. Pt's family back to bedside.

## 2022-10-31 ENCOUNTER — Encounter: Payer: Self-pay | Admitting: Internal Medicine

## 2022-10-31 DIAGNOSIS — J441 Chronic obstructive pulmonary disease with (acute) exacerbation: Secondary | ICD-10-CM | POA: Diagnosis not present

## 2022-10-31 DIAGNOSIS — J9601 Acute respiratory failure with hypoxia: Secondary | ICD-10-CM | POA: Diagnosis not present

## 2022-10-31 DIAGNOSIS — J189 Pneumonia, unspecified organism: Secondary | ICD-10-CM | POA: Diagnosis not present

## 2022-10-31 LAB — COMPREHENSIVE METABOLIC PANEL
ALT: 20 U/L (ref 0–44)
AST: 25 U/L (ref 15–41)
Albumin: 2.8 g/dL — ABNORMAL LOW (ref 3.5–5.0)
Alkaline Phosphatase: 34 U/L — ABNORMAL LOW (ref 38–126)
Anion gap: 8 (ref 5–15)
BUN: 17 mg/dL (ref 8–23)
CO2: 24 mmol/L (ref 22–32)
Calcium: 8.6 mg/dL — ABNORMAL LOW (ref 8.9–10.3)
Chloride: 105 mmol/L (ref 98–111)
Creatinine, Ser: 0.57 mg/dL (ref 0.44–1.00)
GFR, Estimated: >60 mL/min (ref >60)
Glucose, Bld: 342 mg/dL — ABNORMAL HIGH (ref 70–99)
Potassium: 4.1 mmol/L (ref 3.5–5.1)
Sodium: 137 mmol/L (ref 135–145)
Total Bilirubin: 0.8 mg/dL (ref 0.3–1.2)
Total Protein: 6.1 g/dL — ABNORMAL LOW (ref 6.5–8.1)

## 2022-10-31 LAB — CBC WITH DIFFERENTIAL/PLATELET
Abs Immature Granulocytes: 0.07 10*3/uL (ref 0.00–0.07)
Basophils Absolute: 0 10*3/uL (ref 0.0–0.1)
Basophils Relative: 0 %
Eosinophils Absolute: 0 10*3/uL (ref 0.0–0.5)
Eosinophils Relative: 0 %
HCT: 38 % (ref 36.0–46.0)
Hemoglobin: 12.2 g/dL (ref 12.0–15.0)
Immature Granulocytes: 1 %
Lymphocytes Relative: 12 %
Lymphs Abs: 1.7 10*3/uL (ref 0.7–4.0)
MCH: 28.2 pg (ref 26.0–34.0)
MCHC: 32.1 g/dL (ref 30.0–36.0)
MCV: 88 fL (ref 80.0–100.0)
Monocytes Absolute: 0.7 10*3/uL (ref 0.1–1.0)
Monocytes Relative: 5 %
Neutro Abs: 11.4 10*3/uL — ABNORMAL HIGH (ref 1.7–7.7)
Neutrophils Relative %: 82 %
Platelets: 266 10*3/uL (ref 150–400)
RBC: 4.32 MIL/uL (ref 3.87–5.11)
RDW: 14.1 % (ref 11.5–15.5)
WBC: 13.8 10*3/uL — ABNORMAL HIGH (ref 4.0–10.5)
nRBC: 0 % (ref 0.0–0.2)

## 2022-10-31 LAB — CBG MONITORING, ED
Glucose-Capillary: 345 mg/dL — ABNORMAL HIGH (ref 70–99)
Glucose-Capillary: 362 mg/dL — ABNORMAL HIGH (ref 70–99)

## 2022-10-31 LAB — STREP PNEUMONIAE URINARY ANTIGEN: Strep Pneumo Urinary Antigen: NEGATIVE

## 2022-10-31 MED ORDER — PREDNISONE 10 MG PO TABS: ORAL_TABLET | ORAL | 0 refills | Status: AC

## 2022-10-31 MED ORDER — ALBUTEROL SULFATE HFA 108 (90 BASE) MCG/ACT IN AERS: 2.0000 | INHALATION_SPRAY | Freq: Four times a day (QID) | RESPIRATORY_TRACT | 2 refills | Status: DC | PRN

## 2022-10-31 MED ORDER — IPRATROPIUM-ALBUTEROL 0.5-2.5 (3) MG/3ML IN SOLN: 3.0000 mL | Freq: Four times a day (QID) | RESPIRATORY_TRACT | 3 refills | Status: DC | PRN

## 2022-10-31 MED ORDER — BUDESONIDE-FORMOTEROL FUMARATE 80-4.5 MCG/ACT IN AERO: 2.0000 | INHALATION_SPRAY | Freq: Two times a day (BID) | RESPIRATORY_TRACT | 12 refills | Status: DC

## 2022-10-31 MED ORDER — AZITHROMYCIN 500 MG PO TABS: 500.0000 mg | ORAL_TABLET | Freq: Every day | ORAL | 0 refills | Status: DC

## 2022-10-31 MED ORDER — CEFDINIR 300 MG PO CAPS: 300.0000 mg | ORAL_CAPSULE | Freq: Two times a day (BID) | ORAL | 0 refills | Status: AC

## 2022-10-31 MED ORDER — INSULIN GLARGINE-YFGN 100 UNIT/ML ~~LOC~~ SOLN
25.0000 [IU] | Freq: Two times a day (BID) | SUBCUTANEOUS | Status: DC
  Administered 2022-10-31: 25 [IU] via SUBCUTANEOUS
  Filled 2022-10-31 (×2): qty 0.25

## 2022-10-31 MED ORDER — AZITHROMYCIN 500 MG PO TABS: 500.0000 mg | ORAL_TABLET | Freq: Every day | ORAL | 0 refills | Status: AC

## 2022-10-31 NOTE — Discharge Summary (Signed)
Physician Discharge Summary  Brittany Pruitt JJH:[id number] DOB: 1960-12-17 DOA: 10/29/2022  PCP: [name], NP  Admit date: 10/29/2022 Discharge date: 10/31/2022  Admitted From: Home Disposition: Home  Recommendations for Outpatient Follow-up:  Follow up with PCP in 1-2 weeks Continue azithromycin and cefdinir to complete antibiotic course for community-acquired pneumonia Continue prednisone taper for COPD exacerbation Please obtain BMP/CBC in one week Ambulatory referral placed to pulmonology for 8 mm nodule noted on CT angiogram chest. Continue annual surveillance of aortic aneurysm  Home Health: None Equipment/Devices: 2 L per nasal cannula, home nebulizer machine  Discharge Condition: Stable CODE STATUS: Full code Diet recommendation: Heart healthy/consistent carbohydrate diet  History of present illness:  Brittany Pruitt is a 61 y.o. female with medical history significant for DM2, HTN, COPD, tobacco use disorder who presents to the ED with a 1 week history of cough, congestion and wheezing and was found to have acute hypoxic respiratory failure secondary to pneumonia and COPD exacerbation.  Patient was admitted to the hospitalist service.  Hospital course:  Acute respiratory failure with hypoxia secondary to pneumonia and COPD exacerbation Patient presenting to ED with 1 week history of progressive cough, congestion and wheezing.  Patient was notably hypoxic; not oxygen dependent at baseline.  CT angiogram chest negative for PE.  Etiology likely multifactorial with commune acquired pneumonia and COPD exacerbation.  Patient was initially requiring 5 L nasal cannula.  Patient was started on antibiotics with azithromycin and ceftriaxone and scheduled nebs and steroids.  Patient's symptoms improved but on ambulation did desaturate to 85% but maintained on 2 L nasal cannula.  Patient wishes to discharge home today.  Will continue antibiotics with azithromycin and cefdinir,  prednisone taper.  Will also start on Symbicort.  Patient will be issued a nebulizer machine to use as needed.  Ambulatory referral placed to pulmonology outpatient.  Continue to discuss tobacco cessation.   Essential hypertension Initially hypotensive on admission, in which her lisinopril was initially held.  May resume on discharge.   Uncontrolled type 2 diabetes mellitus with hyperglycemia, with long-term current use of insulin (HCC) Continue with Lantus 35 units twice daily.  Continue glipizide and semaglutide.  Outpatient follow-up with PCP.  Hypokalemia Repleted during hospitalization.   Pulmonary nodule seen on imaging study Outpatient surveillance as recommended by radiology.  Amatory referral placed to pulmonology.   Anxiety and depression Continue home buspirone and citalopram, prn home klonopin  Ascending aortic aneurysm Noted on imaging, 4 cm.  Will need annual surveillance.    Discharge Diagnoses:  Principal Problem:   Pneumonia Active Problems:   Acute respiratory failure with hypoxia (HCC)   COPD with acute exacerbation (HCC)   Hypotension   Uncontrolled type 2 diabetes mellitus with hyperglycemia, with long-term current use of insulin (HCC)   Hypokalemia   Anxiety and depression   HTN (hypertension)   Pulmonary nodule seen on imaging study    Discharge Instructions  Discharge Instructions     Ambulatory Referral for Lung Cancer Scre   Complete by: As directed    Call MD for:  difficulty breathing, headache or visual disturbances   Complete by: As directed    Call MD for:  extreme fatigue   Complete by: As directed    Call MD for:  persistant dizziness or light-headedness   Complete by: As directed    Call MD for:  persistant nausea and vomiting   Complete by: As directed    Call MD for:  severe uncontrolled pain   Complete  by: As directed    Call MD for:  temperature >100.4   Complete by: As directed    Diet - low sodium heart healthy   Complete  by: As directed    Increase activity slowly   Complete by: As directed       Allergies as of 10/31/2022       Reactions   Sulfa Antibiotics Hives        Medication List     TAKE these medications    acetaminophen 500 MG tablet Commonly known as: TYLENOL Take 500 mg by mouth every 6 (six) hours as needed for moderate pain.   albuterol 108 (90 Base) MCG/ACT inhaler Commonly known as: VENTOLIN HFA Inhale 2 puffs into the lungs every 6 (six) hours as needed for wheezing or shortness of breath.   azithromycin 500 MG tablet Commonly known as: Zithromax Take 1 tablet (500 mg total) by mouth daily for 4 days. Take 1 tablet daily for 3 days. Start taking on: November 01, 2022   benzonatate 100 MG capsule Commonly known as: TESSALON Take 1 capsule (100 mg total) by mouth 3 (three) times daily as needed.   budesonide-formoterol 80-4.5 MCG/ACT inhaler Commonly known as: Symbicort Inhale 2 puffs into the lungs 2 (two) times daily.   busPIRone 5 MG tablet Commonly known as: BUSPAR Take 1 tablet (5 mg total) by mouth 2 (two) times daily.   cefdinir 300 MG capsule Commonly known as: OMNICEF Take 1 capsule (300 mg total) by mouth 2 (two) times daily for 6 days. Start taking on: November 01, 2022   CENTRUM SILVER 50+WOMEN PO Take 1 tablet by mouth daily.   citalopram 20 MG tablet Commonly known as: CELEXA Take 1 tablet (20 mg total) by mouth daily.   clonazePAM 0.5 MG tablet Commonly known as: KLONOPIN TAKE ONE-HALF (1/2) TO ONE TABLET TWICE A DAY AS NEEDED FOR ANXIETY   dextromethorphan-guaiFENesin 30-600 MG 12hr tablet Commonly known as: MUCINEX DM Take 1 tablet by mouth 2 (two) times daily.   Fish Oil 1000 MG Caps Take 1 capsule by mouth in the morning and at bedtime.   gabapentin 300 MG capsule Commonly known as: NEURONTIN Take 1 capsule (300 mg total) by mouth 4 (four) times daily.   glipiZIDE 10 MG tablet Commonly known as: GLUCOTROL TAKE 1 TABLET BY  MOUTH TWICE DAILY BEFORE A MEAL   ipratropium-albuterol 0.5-2.5 (3) MG/3ML Soln Commonly known as: DUONEB Take 3 mLs by nebulization every 6 (six) hours as needed (shortness of breath or wheezing).   Lantus SoloStar 100 UNIT/ML Solostar Pen Generic drug: insulin glargine Inject 35 Units into the skin 2 (two) times daily.   lisinopril 5 MG tablet Commonly known as: ZESTRIL Take 1 tablet (5 mg total) by mouth daily.   meclizine 25 MG tablet Commonly known as: ANTIVERT TAKE 1 TABLET THREE TIMES A DAY AS NEEDED FOR DIZZINESS   omeprazole 20 MG capsule Commonly known as: PRILOSEC Take 1 capsule (20 mg total) by mouth daily.   Ozempic (0.25 or 0.5 MG/DOSE) 2 MG/1.5ML Sopn Generic drug: Semaglutide(0.25 or 0.'5MG'$ /DOS) Inject into the skin. 0.25 mg once weekly for 4 weeks, then will increase to 0.5 mg weekly   predniSONE 10 MG tablet Commonly known as: DELTASONE Take 4 tablets (40 mg total) by mouth daily for 4 days, THEN 3 tablets (30 mg total) daily for 2 days, THEN 2 tablets (20 mg total) daily for 2 days, THEN 1 tablet (10 mg total) daily for 2  days. Start taking on: October 31, 2022   promethazine-dextromethorphan 6.25-15 MG/5ML syrup Commonly known as: PROMETHAZINE-DM Take 5 mLs by mouth 4 (four) times daily as needed.   simvastatin 20 MG tablet Commonly known as: ZOCOR TAKE 1 TABLET BY MOUTH ONCE DAILY AT  6PM               Durable Medical Equipment  (From admission, onward)           Start     Ordered   10/31/22 1300  For home use only DME oxygen  Once       Question Answer Comment  Length of Need 6 Months   Mode or (Route) Nasal cannula   Liters per Minute 2   Frequency Continuous (stationary and portable oxygen unit needed)   Oxygen conserving device Yes   Oxygen delivery system Gas      10/31/22 1300   10/31/22 1300  For home use only DME Nebulizer machine  Once       Question Answer Comment  Patient needs a nebulizer to treat with the following  condition COPD (chronic obstructive pulmonary disease) ([place])   Length of Need Lifetime      10/31/22 1300            Follow-up Information     [name], NP. Schedule an appointment as soon as possible for a visit in 1 week(s).   Specialties: Internal Medicine, Emergency Medicine Contact information: [place] [address] [phone]                Allergies  Allergen Reactions   Sulfa Antibiotics Hives    Consultations: None  Procedures/Studies: CT Angio Chest PE W and/or Wo Contrast  Result Date: 10/29/2022 CLINICAL DATA:  Shortness of breath for 1 week EXAM: CT ANGIOGRAPHY CHEST WITH CONTRAST TECHNIQUE: Multidetector CT imaging of the chest was performed using the standard protocol during bolus administration of intravenous contrast. Multiplanar CT image reconstructions and MIPs were obtained to evaluate the vascular anatomy. RADIATION DOSE REDUCTION: This exam was performed according to the departmental dose-optimization program which includes automated exposure control, adjustment of the mA and/or kV according to patient size and/or use of iterative reconstruction technique. CONTRAST:  48m OMNIPAQUE IOHEXOL 350 MG/ML SOLN COMPARISON:  Chest x-ray from earlier in the same day. FINDINGS: Cardiovascular: Atherosclerotic calcifications of the thoracic aorta are noted. Dilatation of the ascending aorta to 4 cm is noted. No evidence of dissection is seen. Heart is at the upper limits of normal in size. Pulmonary artery shows a normal branching pattern bilaterally. No filling defect to suggest pulmonary embolism is noted. Mediastinum/Nodes: Thoracic inlet is within normal limits. Scattered small mediastinal lymph nodes are noted. Slightly more marked hilar lymph nodes are noted bilaterally likely reactive in nature. The esophagus is within normal limits with the exception of a small sliding-type hiatal hernia. Lungs/Pleura: Lungs are well aerated  bilaterally. Considerable inspissated material is noted particularly in the lower lobe bronchial tree likely related to mucous. Patchy airspace opacity is noted primarily within the right upper and bilateral lower lobes. 8 mm nodule is noted along the minor fissure anteriorly. No sizable effusion or pneumothorax is noted. Upper Abdomen: Visualized upper abdomen is unremarkable. Musculoskeletal: Degenerative changes of the thoracic spine are noted. Review of the MIP images confirms the above findings. IMPRESSION: No evidence of pulmonary emboli. Patchy airspace opacities bilaterally likely related to atypical pneumonia. Some inspissated material is noted within the lower  lobe bronchial tree likely related to mucous. Dilatation of the ascending aorta to 4 cm. Recommend annual imaging followup by CTA or MRA. This recommendation follows 2010 ACCF/AHA/AATS/ACR/ASA/SCA/SCAI/SIR/STS/SVM Guidelines for the Diagnosis and Management of Patients with Thoracic Aortic Disease. Circulation. 2010; 121: J242-A834. Aortic aneurysm NOS (ICD10-I71.9) 8 mm solid pulmonary nodule. Per Fleischner Society Guidelines, recommend a non-contrast Chest CT at 6-12 months. If patient is high risk for malignancy, consider an additional non-contrast Chest CT at 18-24 months. If patient is low risk for malignancy, non-contrast Chest CT at 18-24 months is optional. These guidelines do not apply to immunocompromised patients and patients with cancer. Follow up in patients with significant comorbidities as clinically warranted. For lung cancer screening, adhere to Lung-RADS guidelines. Reference: Radiology. 2017; 284(1):228-43. Aortic Atherosclerosis (ICD10-I70.0). Electronically Signed   By: Inez Catalina M.D.   On: 10/29/2022 23:07   DG Chest Port 1 View  Result Date: 10/29/2022 CLINICAL DATA:  Shortness of breath and flu-like symptoms. Oxygen saturation 89% on room air on arrival. EXAM: PORTABLE CHEST 1 VIEW COMPARISON:  None Available.  FINDINGS: In Atherosclerotic calcification of the aortic arch. Heart size within normal limits. Bilateral airway thickening noted with bilateral interstitial accentuation, slightly more notable on the right than the left. Accentuated right infrahilar density probably related to atelectasis or pneumonia. Mild bilateral hilar prominence may reflect reactive adenopathy; no paratracheal density to suggest paratracheal adenopathy. No blunting of the costophrenic angles. No significant bony abnormality identified. IMPRESSION: 1. Airway thickening with bilateral interstitial accentuation, slightly more notable on the right than the left, compatible with noncardiogenic edema or atypical pneumonia. 2. Accentuated right infrahilar density probably related to atelectasis or pneumonia. 3. Mild bilateral hilar prominence may reflect reactive adenopathy. 4. Atherosclerotic calcification of the aortic arch. 5. If the patient has a history of smoking or other risk factors for lung cancer, consider chest CT. Electronically Signed   By: Van Clines M.D.   On: 10/29/2022 21:59     Subjective: Patient seen examined bedside, resting comfortably.  Sitting at edge of bed.  Family present.  Shortness of breath much improved.  Now only requiring 2 L nasal cannula on ambulation.  States ready for discharge home.  Discussed continued tobacco cessation, home O2 needs and will start inhalers for her underlying COPD outpatient.  Will need outpatient follow-up with pulmonology, ambulatory referral placed.  No other specific questions or concerns at this time.  Denies headache, no visual changes, no chest pain, no palpitations, no fever/chills/night sweats, no nausea/vomiting/diarrhea, no abdominal pain, no focal weakness, no fatigue, no paresthesias.  No acute events overnight per nursing staff.  Discharge Exam: Vitals:   10/31/22 1100 10/31/22 1226  BP:  112/78  Pulse:  72  Resp:  20  Temp:  98.1 F (36.7 C)  SpO2: 97% 97%    Vitals:   10/31/22 0730 10/31/22 0826 10/31/22 1100 10/31/22 1226  BP: (!) 142/81 110/78  112/78  Pulse: 78 70  72  Resp: (!) '22 20  20  '$ Temp:  98 F (36.7 C)  98.1 F (36.7 C)  TempSrc:  Oral  Oral  SpO2: 94% 95% 97% 97%  Weight:      Height:        Physical Exam: GEN: NAD, alert and oriented x 3, obese HEENT: NCAT, PERRL, EOMI, sclera clear, MMM PULM: Slight diminished breath sounds bilateral bases, mild mid to late expiratory wheezing throughout all lung fields, no crackles, normal [place] effort without accessory muscle use, on room air  at rest but requiring 2 L nasal cannula on exertion. CV: RRR w/o M/G/R GI: abd soft, NTND, NABS, no R/G/M MSK: no peripheral edema, muscle strength globally intact 5/5 bilateral upper/lower extremities NEURO: CN II-XII intact, no focal deficits, sensation to light touch intact PSYCH: normal mood/affect Integumentary: dry/intact, no rashes or wounds    The results of significant diagnostics from this hospitalization (including imaging, microbiology, ancillary and laboratory) are listed below for reference.     Microbiology: Recent Results (from the past 240 hour(s))  Blood Culture (routine x 2)     Status: None (Preliminary result)   Collection Time: 10/29/22  9:05 PM   Specimen: Right Antecubital; Blood  Result Value Ref Range Status   Specimen Description RIGHT ANTECUBITAL  Final   Special Requests   Final    BOTTLES DRAWN AEROBIC AND ANAEROBIC Blood Culture adequate volume   Culture   Final    NO GROWTH 2 DAYS Performed at [facility], [address], [place], [place] [address]    Report Status PENDING  Incomplete  Resp panel by RT-PCR (RSV, Flu A&B, Covid) Anterior Nasal Swab     Status: None   Collection Time: 10/29/22  9:15 PM   Specimen: Anterior Nasal Swab  Result Value Ref Range Status   SARS Coronavirus 2 by RT PCR NEGATIVE NEGATIVE Final    Comment: (NOTE) SARS-CoV-2 target nucleic acids are NOT  DETECTED.  The SARS-CoV-2 RNA is generally detectable in upper respiratory specimens during the acute phase of infection. The lowest concentration of SARS-CoV-2 viral copies this assay can detect is 138 copies/mL. A negative result does not preclude SARS-Cov-2 infection and should not be used as the sole basis for treatment or other patient management decisions. A negative result may occur with  improper specimen collection/handling, submission of specimen other than nasopharyngeal swab, presence of viral mutation(s) within the areas targeted by this assay, and inadequate number of viral copies(<138 copies/mL). A negative result must be combined with clinical observations, patient history, and epidemiological information. The expected result is Negative.  Fact Sheet for Patients:  EntrepreneurPulse.com.au  Fact Sheet for Healthcare Providers:  IncredibleEmployment.be  This test is no t yet approved or cleared by the [place] FDA and  has been authorized for detection and/or diagnosis of SARS-CoV-2 by FDA under an Emergency Use Authorization (EUA). This EUA will remain  in effect (meaning this test can be used) for the duration of the COVID-19 declaration under Section 564(b)(1) of the Act, 21 U.S.C.section 360bbb-3(b)(1), unless the authorization is terminated  or revoked sooner.       Influenza A by PCR NEGATIVE NEGATIVE Final   Influenza B by PCR NEGATIVE NEGATIVE Final    Comment: (NOTE) The Xpert Xpress SARS-CoV-2/FLU/RSV plus assay is intended as an aid in the diagnosis of influenza from Nasopharyngeal swab specimens and should not be used as a sole basis for treatment. Nasal washings and aspirates are unacceptable for Xpert Xpress SARS-CoV-2/FLU/RSV testing.  Fact Sheet for Patients: EntrepreneurPulse.com.au  Fact Sheet for Healthcare Providers: IncredibleEmployment.be  This test is not yet  approved or cleared by the [place] FDA and has been authorized for detection and/or diagnosis of SARS-CoV-2 by FDA under an Emergency Use Authorization (EUA). This EUA will remain in effect (meaning this test can be used) for the duration of the COVID-19 declaration under Section 564(b)(1) of the Act, 21 U.S.C. section 360bbb-3(b)(1), unless the authorization is terminated or revoked.     Resp Syncytial Virus by PCR  NEGATIVE NEGATIVE Final    Comment: (NOTE) Fact Sheet for Patients: EntrepreneurPulse.com.au  Fact Sheet for Healthcare Providers: IncredibleEmployment.be  This test is not yet approved or cleared by the [place] FDA and has been authorized for detection and/or diagnosis of SARS-CoV-2 by FDA under an Emergency Use Authorization (EUA). This EUA will remain in effect (meaning this test can be used) for the duration of the COVID-19 declaration under Section 564(b)(1) of the Act, 21 U.S.C. section 360bbb-3(b)(1), unless the authorization is terminated or revoked.  Performed at [facility], [address] [place], [place] [address]      Labs: BNP (last 3 results) Recent Labs    10/29/22 2058  BNP 70.3   Basic Metabolic Panel: Recent Labs  Lab 10/29/22 2058 10/30/22 0345 10/31/22 0341  NA 133* 134* 137  K 3.3* 3.3* 4.1  CL 98 101 105  CO2 '25 24 24  '$ GLUCOSE 223* 317* 342*  BUN '20 15 17  '$ CREATININE 0.83 0.70 0.57  CALCIUM 8.4* 8.1* 8.6*  MG  --  1.9  --    Liver Function Tests: Recent Labs  Lab 10/29/22 2058 10/31/22 0341  AST 35 25  ALT 21 20  ALKPHOS 35* 34*  BILITOT 1.2 0.8  PROT 6.9 6.1*  ALBUMIN 3.3* 2.8*   No results for input(s): "LIPASE", "AMYLASE" in the last 168 hours. No results for input(s): "AMMONIA" in the last 168 hours. CBC: Recent Labs  Lab 10/29/22 2058 10/30/22 0345 10/31/22 0341  WBC 12.0* 12.6* 13.8*  NEUTROABS 8.2*  --  11.4*  HGB 13.0 12.2 12.2  HCT 40.1  37.8 38.0  MCV 87.0 88.3 88.0  PLT 199 206 266   Cardiac Enzymes: No results for input(s): "CKTOTAL", "CKMB", "CKMBINDEX", "TROPONINI" in the last 168 hours. BNP: Invalid input(s): "POCBNP" CBG: Recent Labs  Lab 10/30/22 1627 10/30/22 1820 10/30/22 2226 10/31/22 0709 10/31/22 1142  GLUCAP 318* 355* 316* 345* 362*   D-Dimer No results for input(s): "DDIMER" in the last 72 hours. Hgb A1c No results for input(s): "HGBA1C" in the last 72 hours. Lipid Profile No results for input(s): "CHOL", "HDL", "LDLCALC", "TRIG", "CHOLHDL", "LDLDIRECT" in the last 72 hours. Thyroid function studies No results for input(s): "TSH", "T4TOTAL", "T3FREE", "THYROIDAB" in the last 72 hours.  Invalid input(s): "FREET3" Anemia work up No results for input(s): "VITAMINB12", "FOLATE", "FERRITIN", "TIBC", "IRON", "RETICCTPCT" in the last 72 hours. Urinalysis    Component Value Date/Time   COLORURINE YELLOW (A) 10/29/2022 1629   APPEARANCEUR CLEAR (A) 10/29/2022 1629   LABSPEC 1.030 10/29/2022 1629   PHURINE 6.0 10/29/2022 1629   GLUCOSEU >=500 (A) 10/29/2022 1629   HGBUR SMALL (A) 10/29/2022 1629   BILIRUBINUR NEGATIVE 10/29/2022 1629   KETONESUR 20 (A) 10/29/2022 1629   PROTEINUR 30 (A) 10/29/2022 1629   NITRITE NEGATIVE 10/29/2022 1629   LEUKOCYTESUR NEGATIVE 10/29/2022 1629   Sepsis Labs Recent Labs  Lab 10/29/22 2058 10/30/22 0345 10/31/22 0341  WBC 12.0* 12.6* 13.8*   Microbiology Recent Results (from the past 240 hour(s))  Blood Culture (routine x 2)     Status: None (Preliminary result)   Collection Time: 10/29/22  9:05 PM   Specimen: Right Antecubital; Blood  Result Value Ref Range Status   Specimen Description RIGHT ANTECUBITAL  Final   Special Requests   Final    BOTTLES DRAWN AEROBIC AND ANAEROBIC Blood Culture adequate volume   Culture   Final    NO GROWTH 2 DAYS Performed at [facility], [address] [place], [place] [address]    Report Status PENDING   Incomplete  Resp panel by RT-PCR (RSV, Flu A&B, Covid) Anterior Nasal Swab     Status: None   Collection Time: 10/29/22  9:15 PM   Specimen: Anterior Nasal Swab  Result Value Ref Range Status   SARS Coronavirus 2 by RT PCR NEGATIVE NEGATIVE Final    Comment: (NOTE) SARS-CoV-2 target nucleic acids are NOT DETECTED.  The SARS-CoV-2 RNA is generally detectable in upper respiratory specimens during the acute phase of infection. The lowest concentration of SARS-CoV-2 viral copies this assay can detect is 138 copies/mL. A negative result does not preclude SARS-Cov-2 infection and should not be used as the sole basis for treatment or other patient management decisions. A negative result may occur with  improper specimen collection/handling, submission of specimen other than nasopharyngeal swab, presence of viral mutation(s) within the areas targeted by this assay, and inadequate number of viral copies(<138 copies/mL). A negative result must be combined with clinical observations, patient history, and epidemiological information. The expected result is Negative.  Fact Sheet for Patients:  EntrepreneurPulse.com.au  Fact Sheet for Healthcare Providers:  IncredibleEmployment.be  This test is no t yet approved or cleared by the [place] FDA and  has been authorized for detection and/or diagnosis of SARS-CoV-2 by FDA under an Emergency Use Authorization (EUA). This EUA will remain  in effect (meaning this test can be used) for the duration of the COVID-19 declaration under Section 564(b)(1) of the Act, 21 U.S.C.section 360bbb-3(b)(1), unless the authorization is terminated  or revoked sooner.       Influenza A by PCR NEGATIVE NEGATIVE Final   Influenza B by PCR NEGATIVE NEGATIVE Final    Comment: (NOTE) The Xpert Xpress SARS-CoV-2/FLU/RSV plus assay is intended as an aid in the diagnosis of influenza from Nasopharyngeal swab specimens and should  not be used as a sole basis for treatment. Nasal washings and aspirates are unacceptable for Xpert Xpress SARS-CoV-2/FLU/RSV testing.  Fact Sheet for Patients: EntrepreneurPulse.com.au  Fact Sheet for Healthcare Providers: IncredibleEmployment.be  This test is not yet approved or cleared by the [place] FDA and has been authorized for detection and/or diagnosis of SARS-CoV-2 by FDA under an Emergency Use Authorization (EUA). This EUA will remain in effect (meaning this test can be used) for the duration of the COVID-19 declaration under Section 564(b)(1) of the Act, 21 U.S.C. section 360bbb-3(b)(1), unless the authorization is terminated or revoked.     Resp Syncytial Virus by PCR NEGATIVE NEGATIVE Final    Comment: (NOTE) Fact Sheet for Patients: EntrepreneurPulse.com.au  Fact Sheet for Healthcare Providers: IncredibleEmployment.be  This test is not yet approved or cleared by the [place] FDA and has been authorized for detection and/or diagnosis of SARS-CoV-2 by FDA under an Emergency Use Authorization (EUA). This EUA will remain in effect (meaning this test can be used) for the duration of the COVID-19 declaration under Section 564(b)(1) of the Act, 21 U.S.C. section 360bbb-3(b)(1), unless the authorization is terminated or revoked.  Performed at [facility] [facility], [address] [place], [place] [address]      Time coordinating discharge: Over 30 minutes  SIGNED:   Danilynn Jemison J [place] DO  [facility] 10/31/2022, 1:02 PM

## 2022-10-31 NOTE — Evaluation (Signed)
Physical Therapy Evaluation Patient Details Name: Brittany Pruitt MRN: [id number] DOB: April 28, 1961 Today's Date: 10/31/2022  History of Present Illness  Brittany Pruitt is a 61 y.o. female with medical history significant for DM, HTN, COPD who presents to the ED with a 1 week history of cough, congestion and wheezing.  She presented by EMS who reported an O2 sat of 89% on room air placing her on NRB at 15 L for transport.  Patient denies chest pain, fever or chills, lower extremity pain or swelling.  She reports decreased appetite but she has had no vomiting diarrhea or abdominal pain.  She has been in contact with someone with a flulike illness.  Clinical Impression  Pt seated at EOB upon arrival to the room.  Pt having difficulty speaking due to breathing complications from COPD.  Pt currently placed on 5L of O2 within the room.  Pt bumped down to 4L on O2 tank and was able to ambulate around the ED.  Pt then progressed to room air and was able to achieve ambulation around home distance with a drop to 85% at conclusion of the session.  Pt notes to be somewhat SOB and drops in O2 saturation levels when speaking.  Pt noted that she felt as though she was ready to discharge and would be able to control her breathing at home.  Pt does perform well and has good response with pursed lip breathing and is able to achieve >90% of O2 saturation levels.  Current discharge plans to home without any therapy services are appropriate at this time.        Recommendations for follow up therapy are one component of a multi-disciplinary discharge planning process, led by the attending physician.  Recommendations may be updated based on patient status, additional functional criteria and insurance authorization.  Follow Up Recommendations No PT follow up      Assistance Recommended at Discharge PRN  Patient can return home with the following  A little help with walking and/or transfers;A little help with  bathing/dressing/bathroom    Equipment Recommendations None recommended by PT  Recommendations for Other Services       Functional Status Assessment Patient has had a recent decline in their functional status and demonstrates the ability to make significant improvements in function in a reasonable and predictable amount of time.     Precautions / Restrictions Precautions Precautions: None Restrictions Weight Bearing Restrictions: No      Mobility  Bed Mobility               General bed mobility comments: pt sitting at EOB upon arrival and concluded session sitting EOB.    Transfers Overall transfer level: Needs assistance Equipment used: None               General transfer comment: pt only needs assistance with management of lines/leads/O2.    Ambulation/Gait Ambulation/Gait assistance: Min guard Gait Distance (Feet): 200 Feet Assistive device: None Gait Pattern/deviations: WFL(Within Functional Limits) Gait velocity: decreased     General Gait Details: Pt slow with gait and use of 4L of supplemental O2 for first lap; room air for subsequent lap.  Stairs            Wheelchair Mobility    Modified Rankin (Stroke Patients Only)       Balance Overall balance assessment: Modified Independent  Pertinent Vitals/Pain Pain Assessment Pain Assessment: No/denies pain    Home Living Family/patient expects to be discharged to:: Private residence Living Arrangements: Spouse/significant other Available Help at Discharge: Family;Available 24 hours/day Type of Home: House Home Access: Level entry       Home Layout: One level Home Equipment: Shower seat;Grab bars - toilet;Grab bars - tub/shower      Prior Function Prior Level of Function : Independent/Modified Independent                     Hand Dominance   Dominant Hand: Right    Extremity/Trunk Assessment   Upper  Extremity Assessment Upper Extremity Assessment: Generalized weakness    Lower Extremity Assessment Lower Extremity Assessment: Generalized weakness       Communication   Communication: No difficulties  Cognition Arousal/Alertness: Awake/alert Behavior During Therapy: WFL for tasks assessed/performed Overall Cognitive Status: Within Functional Limits for tasks assessed                                          General Comments      Exercises     Assessment/Plan    PT Assessment Patient does not need any further PT services  PT Problem List         PT Treatment Interventions      PT Goals (Current goals can be found in the Care Plan section)  Acute Rehab PT Goals Patient Stated Goal: to go home. PT Goal Formulation: With patient Time For Goal Achievement: 11/14/22 Potential to Achieve Goals: Good    Frequency       Co-evaluation               AM-PAC PT "6 Clicks" Mobility  Outcome Measure Help needed turning from your back to your side while in a flat bed without using bedrails?: None Help needed moving from lying on your back to sitting on the side of a flat bed without using bedrails?: None Help needed moving to and from a bed to a chair (including a wheelchair)?: None Help needed standing up from a chair using your arms (e.g., wheelchair or bedside chair)?: None Help needed to walk in hospital room?: None Help needed climbing 3-5 steps with a railing? : None 6 Click Score: 24    End of Session Equipment Utilized During Treatment: Gait belt Activity Tolerance: Patient tolerated treatment well Patient left: in bed;with family/visitor present (seated EOB.) Nurse Communication: Mobility status PT Visit Diagnosis: Muscle weakness (generalized) (M62.81)    Time: 1610-9604 PT Time Calculation (min) (ACUTE ONLY): 36 min   Charges:   PT Evaluation $PT Eval Low Complexity: 1 Low PT Treatments $Gait Training: 23-37 mins         Gwenlyn Saran, PT, DPT Physical Therapist- [facility]  10/31/22, 11:32 AM

## 2022-10-31 NOTE — ED Notes (Signed)
Duoneb stopped

## 2022-10-31 NOTE — TOC CM/SW Note (Signed)
Patient has orders to discharge home today. Attending ordered nebulizer machine. Patient is agreeable. No DME agency preference. Ordered through [employer] to be delivered to the room before she leaves. No further concerns. CSW signing off.  Dayton Scrape, [place]

## 2022-11-01 ENCOUNTER — Telehealth: Payer: Self-pay | Admitting: *Deleted

## 2022-11-01 NOTE — Patient Outreach (Signed)
  Care Coordination [facility] Note Transition Care Management Unsuccessful Follow-up Telephone Call  Date of discharge and from where:  [facility] [id number]  Attempts:  1st Attempt  Reason for unsuccessful TCM follow-up call:  Left voice message [place] Care Management [phone]

## 2022-11-03 DIAGNOSIS — I10 Essential (primary) hypertension: Secondary | ICD-10-CM | POA: Diagnosis not present

## 2022-11-03 DIAGNOSIS — Z794 Long term (current) use of insulin: Secondary | ICD-10-CM | POA: Diagnosis not present

## 2022-11-03 DIAGNOSIS — F17210 Nicotine dependence, cigarettes, uncomplicated: Secondary | ICD-10-CM | POA: Diagnosis not present

## 2022-11-03 DIAGNOSIS — E1159 Type 2 diabetes mellitus with other circulatory complications: Secondary | ICD-10-CM | POA: Diagnosis not present

## 2022-11-03 LAB — CULTURE, BLOOD (ROUTINE X 2)
Culture: NO GROWTH
Special Requests: ADEQUATE

## 2022-11-04 LAB — LEGIONELLA PNEUMOPHILA SEROGP 1 UR AG: L. pneumophila Serogp 1 Ur Ag: NEGATIVE

## 2022-11-05 ENCOUNTER — Telehealth: Payer: Self-pay | Admitting: *Deleted

## 2022-11-05 NOTE — Patient Outreach (Signed)
  Care Coordination [facility] Note Transition Care Management Unsuccessful Follow-up Telephone Call  Date of discharge and from where:  [id number] [facility]  Attempts:  2nd Attempt  Reason for unsuccessful TCM follow-up call:  Left voice message  [place] Care Management [phone]

## 2022-11-06 ENCOUNTER — Ambulatory Visit (INDEPENDENT_AMBULATORY_CARE_PROVIDER_SITE_OTHER): Payer: Medicare Other | Admitting: Pharmacist

## 2022-11-06 ENCOUNTER — Telehealth: Payer: Self-pay

## 2022-11-06 DIAGNOSIS — J441 Chronic obstructive pulmonary disease with (acute) exacerbation: Secondary | ICD-10-CM

## 2022-11-06 DIAGNOSIS — E1165 Type 2 diabetes mellitus with hyperglycemia: Secondary | ICD-10-CM

## 2022-11-06 NOTE — Chronic Care Management (AMB) (Signed)
Chronic Care Management CCM Pharmacy Note  11/06/2022 Name:  Brittany Pruitt MRN:  [id number] DOB:  1960/11/23   Subjective: Brittany Pruitt is an 62 y.o. year old female who is a primary patient of [name], NP.  The CCM team was consulted for assistance with disease management and care coordination needs.    Engaged with patient by telephone for follow up visit for pharmacy case management and/or care coordination services.   Objective:  Medications Reviewed Today     Reviewed by Rennis Petty, RPH-CPP (Pharmacist) on 11/06/22 at 1355  Med List Status: <None>   Medication Order Taking? Sig Documenting Provider Last Dose Status Informant  acetaminophen (TYLENOL) 500 MG tablet [id number]  Take 500 mg by mouth every 6 (six) hours as needed for moderate pain. [provider]  Active Self  albuterol (VENTOLIN HFA) 108 (90 Base) MCG/ACT inhaler [id number] Yes Inhale 2 puffs into the lungs every 6 (six) hours as needed for wheezing or shortness of breath. [place] [name], DO Taking Active   benzonatate (TESSALON) 100 MG capsule [id number]  Take 1 capsule (100 mg total) by mouth 3 (three) times daily as needed. Fenton Malling M, PA-C  Active Self  budesonide-formoterol [facility]) 80-4.5 MCG/ACT inhaler [id number] Yes Inhale 2 puffs into the lungs 2 (two) times daily. [place] [name], DO Taking Active   busPIRone (BUSPAR) 5 MG tablet [id number]  Take 1 tablet (5 mg total) by mouth 2 (two) times daily. [name], NP  Active Self  cefdinir (OMNICEF) 300 MG capsule [id number] Yes Take 1 capsule (300 mg total) by mouth 2 (two) times daily for 6 days. [place] [name], DO Taking Active   citalopram (CELEXA) 20 MG tablet [id number]  Take 1 tablet (20 mg total) by mouth daily. [name], NP  Active Self  clonazePAM (KLONOPIN) 0.5 MG tablet [id number]  TAKE ONE-HALF (1/2) TO ONE TABLET TWICE A DAY AS NEEDED FOR ANXIETY [name], [name], NP  Active Self   dextromethorphan-guaiFENesin (MUCINEX DM) 30-600 MG 12hr tablet [id number]  Take 1 tablet by mouth 2 (two) times daily. [provider]  Active Self  gabapentin (NEURONTIN) 300 MG capsule [id number]  Take 1 capsule (300 mg total) by mouth 4 (four) times daily. [name], NP  Active Self  glipiZIDE (GLUCOTROL) 10 MG tablet [id number] Yes TAKE 1 TABLET BY MOUTH TWICE DAILY BEFORE A MEAL  Patient taking differently: Take 5 mg by mouth 2 (two) times daily before a meal. TAKE 1 TABLET BY MOUTH TWICE DAILY BEFORE A MEAL   [name], [name], NP Taking Active Self  insulin glargine (LANTUS SOLOSTAR) 100 UNIT/ML Solostar Pen [id number]  Inject 35 Units into the skin 2 (two) times daily.  Patient taking differently: Inject 30 Units into the skin 2 (two) times daily.   [name], NP  Active Self  ipratropium-albuterol (DUONEB) 0.5-2.5 (3) MG/3ML SOLN [id number] Yes Take 3 mLs by nebulization every 6 (six) hours as needed (shortness of breath or wheezing). [place] [name], DO Taking Active   lisinopril (ZESTRIL) 5 MG tablet [id number]  Take 1 tablet (5 mg total) by mouth daily. [name], NP  Active Self  meclizine (ANTIVERT) 25 MG tablet [id number]  TAKE 1 TABLET THREE TIMES A DAY AS NEEDED FOR DIZZINESS [name], NP  Active Self  Multiple Vitamins-Minerals (CENTRUM SILVER 50+WOMEN PO) [id number]  Take 1 tablet by mouth daily. [provider]  Active Self  Omega-3 Fatty Acids (FISH  OIL) 1000 MG CAPS [id number]  Take 1 capsule by mouth in the morning and at bedtime. [provider]  Active Self  omeprazole (PRILOSEC) 20 MG capsule [id number]  Take 1 capsule (20 mg total) by mouth daily. [name], NP  Active Self  predniSONE (DELTASONE) 10 MG tablet [id number] Yes Take 4 tablets (40 mg total) by mouth daily for 4 days, THEN 3 tablets (30 mg total) daily for 2 days, THEN 2 tablets (20 mg total) daily for 2 days, THEN 1 tablet (10 mg total) daily for 2 days. [place]  [name], DO Taking Active   promethazine-dextromethorphan (PROMETHAZINE-DM) 6.25-15 MG/5ML syrup [id number]  Take 5 mLs by mouth 4 (four) times daily as needed. Mar Daring, PA-C  Active Self  Semaglutide,0.25 or 0.'5MG'$ /DOS, (OZEMPIC, 0.25 OR 0.5 MG/DOSE,) 2 MG/1.5ML SOPN [id number] Yes Inject into the skin. 0.25 mg once weekly for 4 weeks, then will increase to 0.5 mg weekly [provider] Taking Active Self  simvastatin (ZOCOR) 20 MG tablet [id number]  TAKE 1 TABLET BY MOUTH ONCE DAILY AT  [name], NP  Active Self            Pertinent Labs:  Lab Results  Component Value Date   HGBA1C 8.8 (A) 09/06/2022   Lab Results  Component Value Date   CHOL 155 09/06/2022   HDL 38 (L) 09/06/2022   LDLCALC 89 09/06/2022   TRIG 184 (H) 09/06/2022   CHOLHDL 4.1 09/06/2022   Lab Results  Component Value Date   CREATININE 0.57 10/31/2022   BUN 17 10/31/2022   NA 137 10/31/2022   K 4.1 10/31/2022   CL 105 10/31/2022   CO2 24 10/31/2022    SDOH:  (Social Determinants of Health) assessments and interventions performed:  SDOH Interventions    Flowsheet Row Chronic Care Management from 10/07/2022 in [facility] Office Visit from 09/06/2022 in [facility] Office Visit from 11/21/2021 in [facility] Office Visit from 08/21/2021 in [place]  SDOH Interventions      Food Insecurity Interventions Intervention Not Indicated -- -- --  Housing Interventions Intervention Not Indicated -- -- --  Transportation Interventions Intervention Not Indicated -- -- --  Utilities Interventions Intervention Not Indicated -- -- --  Alcohol Usage Interventions Intervention Not Indicated (Score <7) -- -- --  Depression Interventions/Treatment  -- Currently on Treatment Currently on Treatment Medication  Financial Strain Interventions Other (Comment)  [cost constraints with obtaining medications- pharm referral in place] -- --  --  Physical Activity Interventions Intervention Not Indicated, Other (Comments)  [no structured activity, does work in her home and out in her yard] -- -- --  Stress Interventions Other (Comment)  [worries alot, education and support given, resources given about LCSW available if needed] -- -- --  Social Connections Interventions Other (Comment)  [has good support from her family] -- -- --       [place]  Review of patient past medical history, allergies, medications, health status, including review of consultants reports, laboratory and other test data, was performed as part of comprehensive evaluation and provision of chronic care management services.   Care Plan : General Pharmacy (Adult)  Updates made by Rennis Petty, RPH-CPP since 11/06/2022 12:00 AM     Problem: Disease Progression      Long-Range Goal: Disease Progression Prevented or Minimized   Start Date: 10/07/2022  Expected End Date: 01/05/2023  Recent Progress: On  track  Priority: High  Note:   Current Barriers:  Unable to independently afford treatment regimen Reports cost of Mounjaro unaffordable as not covered through her BCBS Medicare plan Unable to achieve control of T2DM   Pharmacist Clinical Goal(s):  patient will verbalize ability to afford treatment regimen achieve control of T2DM as evidenced by A1C  through collaboration with PharmD and provider.    Interventions: 1:1 collaboration with [name], NP regarding development and update of comprehensive plan of care as evidenced by provider attestation and co-signature Inter-disciplinary care team collaboration (see longitudinal plan of care) Perform chart review. Patient admitted to [facility] 12/26-12/28/2023 related to shortness of breath. At discharge, patient advised: Start: azithromycin 500 MG tablet - Take 1 tablet (500 mg total) by mouth daily for 4 days cefdinir 300 MG capsule - Take 1 capsule (300 mg total) by mouth 2 (two) times daily  for 6 days predniSONE 10 MG tablet - Take 4 tablets (40 mg total) by mouth daily for 4 days, THEN 3 tablets (30 mg total) daily for 2 days, THEN 2 tablets (20 mg total) daily for 2 days, THEN 1 tablet (10 mg total) daily for 2 days Albuterol inhaler - 2 puffs into the lungs every 6 (six) hours as needed for wheezing or shortness of breath Symbicort 80-4.5 MCG/ACT inhaler - Inhale 2 puffs into the lungs 2 (two) times daily ipratropium-albuterol 0.5-2.5 (3) MG/3ML Soln - Take 3 mLs by nebulization every 6 (six) hours as needed (shortness of breath or wheezing) Follow up with PCP in 1-2 weeks. Obtain BMP/CBC in one week Ambulatory referral placed to pulmonology for 8 mm nodule noted on CT angiogram chest Continue annual surveillance of aortic aneurysm Today reports completing courses of antibiotics and prednisone as directed at discharge Reports she is resting and staying hydrated. Feeling better, but taking time to get her strength back Encourage patient to contact office today to schedule post-hospitalization follow up appointment with PCP  COPD: Current treatment:  Albuterol inhaler - 2 puffs into the lungs every 6 (six) hours as needed for wheezing or shortness of breath Symbicort 80-4.5 MCG/ACT inhaler - Inhale 2 puffs into the lungs 2 (two) times daily ipratropium-albuterol 0.5-2.5 (3) MG/3ML Soln - Take 3 mLs by nebulization every 6 (six) hours as needed (shortness of breath or wheezing) Counsel patient on importance of using her maintenance inhaler, Symbicort, twice daily as directed and using ipratropium-albuterol or albuterol inhaler as needed. Counsel on importance of rinsing with water and spitting out after each use of Symbicort Advise patient to schedule appointment with Pulmonology per referral from hospital Patient reports she has not restarted smoking since hospital discharge - stopped since 12/23 Motivated by wanting to avoid further hospitalizations Encourage patient to continue  smoking cessation  Diabetes:  Uncontrolled; current treatment: glipizide 10 mg - 1/2 tablet (5 mg) twice daily 30 minutes before breakfast and supper Lantus 30 units twice daily Ozempic 0.25 mg weekly on Tuesdays; planning to start Ozempic 0.5 mg on 11/12/2022 Note patient currently on prednisone course as prescribed at hospital discharge Previous therapies tried: metformin (GI side effects) Current glucose readings:  Today before breakfast 148 Reports since hospital discharge, readings ranging: 135-200 Denies recent hypoglycemia Statin: simvastatin 20 mg daily Have encouraged patient to work on having regular well-balanced meals and snacks spread throughout the day, while controlling carbohydrate portion sizes Collaborating with [place] team for assistance to patient with Ozempic through [employer] applying for patient assistance Patient to  watch for application arriving in mail from [place], complete application and then bring to office to have application and copy of financial documentation faxed back to [employer] An[employer] Riedel at [phone] Patient plans to follow up with PCP to see if another sample of Ozempic is available or to request Rx for Ozempic be sent to pharmacy while awaiting approval for patient assistance program Have counseled patient on s/s of low blood sugar and how to treat lows Review rules of 15s - importance of using 15 grams of sugar to treat low, recheck blood sugar in 15 minutes, treat again if remains low or, if back to normal, having meal if mealtime or snack  Review examples of sources of 15 grams of sugar Encouraged patient to obtain glucose tablets to keep with her Counsel patient to continue to monitor home blood sugar, keep log of results, have this record to review during upcoming appointments and to contact office sooner if needed for reading outside of established parameters or for symptoms such as any  episodes of hypoglycemia  Patient Goals/Self-Care Activities patient will:  - check glucose, document, and provide at future appointments - check blood pressure, document, and provide at future appointments - collaborate with provider on medication access solutions - engage in dietary modifications by having regular well-balanced meals and snacks spread throughout the day, while controlling carbohydrate portion sizes       Plan: Telephone follow up appointment with care management team member scheduled for:  12/09/2022 at 9:15 am  Wallace Cullens, PharmD, [place] [phone]

## 2022-11-06 NOTE — Patient Outreach (Signed)
  Care Coordination TOC Note Transition Care Management Unsuccessful Follow-up Telephone Call  Date of discharge and from where:  10/31/22-[facility]  Attempts:  3rd Attempt  Reason for unsuccessful TCM follow-up call:  Unable to reach patient     [place]et[place] Blend [place] Management Telephonic Care Management Coordinator Direct Phone: [phone] Toll Free: [phone] Fax: [phone]

## 2022-11-06 NOTE — Patient Instructions (Signed)
Visit Information  Thank you for taking time to visit with me today. Please don't hesitate to contact me if I can be of assistance to you before our next scheduled telephone appointment.  Following are the goals we discussed today:   Goals Addressed             This Visit's Progress    Pharmacy Goals       Our goal A1c is less than 7%. This corresponds with fasting sugars less than 130 and 2 hour after meal sugars less than 180. Please keep a log of your results when checking your blood sugar   Our goal bad cholesterol, or LDL, is less than 70 . This is why it is important to continue taking your simvastatin.  Please have your medications and blood sugar log with you for our next telephone call.  Wallace Cullens, PharmD, [place] [phone]          Our next appointment is by telephone on 12/09/2022 at 9:15 am  Please call the care guide team at [phone] if you need to cancel or reschedule your appointment.    Patient verbalizes understanding of instructions and care plan provided today and agrees to view in [place]. Active MyChart status and patient understanding of how to access instructions and care plan via MyChart confirmed with patient.

## 2022-11-08 ENCOUNTER — Ambulatory Visit (INDEPENDENT_AMBULATORY_CARE_PROVIDER_SITE_OTHER): Payer: Medicare Other | Admitting: Internal Medicine

## 2022-11-08 ENCOUNTER — Encounter: Payer: Self-pay | Admitting: Internal Medicine

## 2022-11-08 VITALS — BP 116/68 | HR 66 | Temp 96.8°F | Wt 194.0 lb

## 2022-11-08 DIAGNOSIS — J189 Pneumonia, unspecified organism: Secondary | ICD-10-CM | POA: Diagnosis not present

## 2022-11-08 DIAGNOSIS — J9601 Acute respiratory failure with hypoxia: Secondary | ICD-10-CM | POA: Diagnosis not present

## 2022-11-08 NOTE — Patient Instructions (Signed)

## 2022-11-08 NOTE — Progress Notes (Signed)
Subjective:    Patient ID: Brittany Pruitt, female    DOB: 22-Oct-1961, 62 y.o.   MRN: [id number]  HPI  Patient presents to clinic today for ER follow-up.  She presented to the ER 12/26 with complaint of cough and shortness of breath.  Labs revealed elevated white count of 13.8.  Respiratory panel was negative.  ECG was normal.  X-ray chest was concerning for atypical pneumonia.  Given her level of hypoxia, CTA of the chest was obtained which did not show any evidence of PE.  She was treated with Albuterol, Budesonide, Azithromycin, Cefdinir, DuoNebs and Prednisone.  She was discharged and advised to follow-up with her PCP.  Since that time, she feels like she is feeling better. She still has a lingering cough.  Review of Systems     Past Medical History:  Diagnosis Date   Anxiety    Chicken pox    Depression    Diabetes mellitus without complication (HCC)    Frequent headaches    Hyperlipidemia     Current Outpatient Medications  Medication Sig Dispense Refill   acetaminophen (TYLENOL) 500 MG tablet Take 500 mg by mouth every 6 (six) hours as needed for moderate pain.     albuterol (VENTOLIN HFA) 108 (90 Base) MCG/ACT inhaler Inhale 2 puffs into the lungs every 6 (six) hours as needed for wheezing or shortness of breath. 8 g 2   benzonatate (TESSALON) 100 MG capsule Take 1 capsule (100 mg total) by mouth 3 (three) times daily as needed. 30 capsule 0   budesonide-formoterol (SYMBICORT) 80-4.5 MCG/ACT inhaler Inhale 2 puffs into the lungs 2 (two) times daily. 1 each 12   busPIRone (BUSPAR) 5 MG tablet Take 1 tablet (5 mg total) by mouth 2 (two) times daily. 180 tablet 1   citalopram (CELEXA) 20 MG tablet Take 1 tablet (20 mg total) by mouth daily. 90 tablet 1   clonazePAM (KLONOPIN) 0.5 MG tablet TAKE ONE-HALF (1/2) TO ONE TABLET TWICE A DAY AS NEEDED FOR ANXIETY 45 tablet 0   dextromethorphan-guaiFENesin (MUCINEX DM) 30-600 MG 12hr tablet Take 1 tablet by mouth 2 (two) times daily.      gabapentin (NEURONTIN) 300 MG capsule Take 1 capsule (300 mg total) by mouth 4 (four) times daily. 360 capsule 1   glipiZIDE (GLUCOTROL) 10 MG tablet TAKE 1 TABLET BY MOUTH TWICE DAILY BEFORE A MEAL (Patient taking differently: Take 5 mg by mouth 2 (two) times daily before a meal. TAKE 1 TABLET BY MOUTH TWICE DAILY BEFORE A MEAL) 180 tablet 1   insulin glargine (LANTUS SOLOSTAR) 100 UNIT/ML Solostar Pen Inject 35 Units into the skin 2 (two) times daily. (Patient taking differently: Inject 30 Units into the skin 2 (two) times daily.) 63 mL 0   ipratropium-albuterol (DUONEB) 0.5-2.5 (3) MG/3ML SOLN Take 3 mLs by nebulization every 6 (six) hours as needed (shortness of breath or wheezing). 360 mL 3   lisinopril (ZESTRIL) 5 MG tablet Take 1 tablet (5 mg total) by mouth daily. 90 tablet 1   meclizine (ANTIVERT) 25 MG tablet TAKE 1 TABLET THREE TIMES A DAY AS NEEDED FOR DIZZINESS 30 tablet 0   Multiple Vitamins-Minerals (CENTRUM SILVER 50+WOMEN PO) Take 1 tablet by mouth daily.     Omega-3 Fatty Acids (FISH OIL) 1000 MG CAPS Take 1 capsule by mouth in the morning and at bedtime.     omeprazole (PRILOSEC) 20 MG capsule Take 1 capsule (20 mg total) by mouth daily. 90 capsule 1  predniSONE (DELTASONE) 10 MG tablet Take 4 tablets (40 mg total) by mouth daily for 4 days, THEN 3 tablets (30 mg total) daily for 2 days, THEN 2 tablets (20 mg total) daily for 2 days, THEN 1 tablet (10 mg total) daily for 2 days. 28 tablet 0   promethazine-dextromethorphan (PROMETHAZINE-DM) 6.25-15 MG/5ML syrup Take 5 mLs by mouth 4 (four) times daily as needed. 118 mL 0   Semaglutide,0.25 or 0.'5MG'$ /DOS, (OZEMPIC, 0.25 OR 0.5 MG/DOSE,) 2 MG/1.5ML SOPN Inject into the skin. 0.25 mg once weekly for 4 weeks, then will increase to 0.5 mg weekly     simvastatin (ZOCOR) 20 MG tablet TAKE 1 TABLET BY MOUTH ONCE DAILY AT  6PM 90 tablet 1   No current facility-administered medications for this visit.    Allergies  Allergen Reactions    Sulfa Antibiotics Hives    Family History  Problem Relation Age of Onset   Uterine cancer Mother    Breast cancer Mother 46   Pancreatic cancer Mother    Heart disease Father    Hypertension Father    Anxiety disorder Father    Diabetes Father    Uterine cancer Maternal Aunt    Breast cancer Maternal Aunt 67   Heart disease Paternal Grandfather     Social History   Socioeconomic History   Marital status: Married    Spouse name: Detrice Cales   Number of children: Not on file   Years of education: Not on file   Highest education level: Not on file  Occupational History   Not on file  Tobacco Use   Smoking status: Every Day    Packs/day: 1.00    Years: 30.00    Total pack years: 30.00    Types: Cigarettes   Smokeless tobacco: Never  Vaping Use   Vaping Use: Never used  Substance and Sexual Activity   Alcohol use: No    Alcohol/week: 0.0 standard drinks of alcohol   Drug use: Never   Sexual activity: Yes    Partners: Male  Other Topics Concern   Not on file  Social History Narrative   Pt lives with husband Legrand Como.  Is not currently working.    Social Determinants of Health   Financial Resource Strain: Low Risk  (10/07/2022)   Overall Financial Resource Strain (CARDIA)    Difficulty of Paying Living Expenses: Not very hard  Food Insecurity: No Food Insecurity (10/07/2022)   Hunger Vital Sign    Worried About Running Out of Food in the Last Year: Never true    Ran Out of Food in the Last Year: Never true  Transportation Needs: No Transportation Needs (10/07/2022)   PRAPARE - Hydrologist (Medical): No    Lack of Transportation (Non-Medical): No  Physical Activity: Inactive (10/07/2022)   Exercise Vital Sign    Days of Exercise per Week: 0 days    Minutes of Exercise per Session: 0 min  Stress: No Stress Concern Present (10/07/2022)   [place]    Feeling of Stress  : Only a little  Social Connections: Moderately Isolated (10/07/2022)   Social Connection and Isolation Panel [NHANES]    Frequency of Communication with Friends and Family: More than three times a week    Frequency of Social Gatherings with Friends and Family: More than three times a week    Attends Religious Services: Never    Marine scientist or Organizations: No  Attends Archivist Meetings: Never    Marital Status: Married  Human resources officer Violence: Not At Risk (10/07/2022)   Humiliation, Afraid, Rape, and Kick questionnaire    Fear of Current or Ex-Partner: No    Emotionally Abused: No    Physically Abused: No    Sexually Abused: No     Constitutional: Denies fever, malaise, fatigue, headache or abrupt weight changes.  HEENT: Denies eye pain, eye redness, ear pain, ringing in the ears, wax buildup, runny nose, nasal congestion, bloody nose, or sore throat. Respiratory: Pt reports cough. Denies difficulty breathing, shortness of breath, or sputum production.   Cardiovascular: Denies chest pain, chest tightness, palpitations or swelling in the hands or feet.  Gastrointestinal: Denies abdominal pain, bloating, constipation, diarrhea or blood in the stool.  Musculoskeletal: Denies decrease in range of motion, difficulty with gait, muscle pain or joint pain and swelling.  Skin: Denies redness, rashes, lesions or ulcercations.   No other specific complaints in a complete review of systems (except as listed in HPI above).  Objective:   Physical Exam BP 116/68 (BP Location: Left Arm, Patient Position: Sitting, Cuff Size: Normal)   Pulse 66   Temp (!) 96.8 F (36 C) (Temporal)   Wt 194 lb (88 kg)   SpO2 99%   BMI 33.30 kg/m   Wt Readings from Last 3 Encounters:  10/29/22 192 lb (87.1 kg)  09/06/22 199 lb (90.3 kg)  05/31/22 194 lb (88 kg)    General: Appears her stated age, obese, in NAD. Skin: Warm, dry and intact.  HEENT: Head: normal shape and size;  Eyes: sclera white, no icterus, conjunctiva pink, PERRLA and EOMs intact; Throat/Mouth: Teeth present, mucosa pink and moist, no exudate, lesions or ulcerations noted.  Neck:  No adenopathy noted. Cardiovascular: Normal rate and rhythm. S1,S2 noted.  No murmur, rubs or gallops noted.  Pulmonary/Chest: Normal effort and positive vesicular breath sounds. No respiratory distress. No wheezes, rales or ronchi noted.  Musculoskeletal: No difficulty with gait.  Neurological: Alert and oriented.    BMET    Component Value Date/Time   NA 137 10/31/2022 0341   NA 141 03/16/2018 1537   NA 137 05/06/2012 0949   K 4.1 10/31/2022 0341   K 4.5 05/06/2012 0949   CL 105 10/31/2022 0341   CL 103 05/06/2012 0949   CO2 24 10/31/2022 0341   CO2 27 05/06/2012 0949   GLUCOSE 342 (H) 10/31/2022 0341   GLUCOSE 123 (H) 05/06/2012 0949   BUN 17 10/31/2022 0341   BUN 13 03/16/2018 1537   BUN 10 05/06/2012 0949   CREATININE 0.57 10/31/2022 0341   CREATININE 0.61 09/06/2022 1129   CALCIUM 8.6 (L) 10/31/2022 0341   CALCIUM 9.2 05/06/2012 0949   GFRNONAA >60 10/31/2022 0341   GFRNONAA >60 05/06/2012 0949   GFRAA 103 03/16/2018 1537   GFRAA >60 05/06/2012 0949    Lipid Panel     Component Value Date/Time   CHOL 155 09/06/2022 1129   CHOL 135 02/28/2017 1604   TRIG 184 (H) 09/06/2022 1129   HDL 38 (L) 09/06/2022 1129   HDL 38 (L) 02/28/2017 1604   CHOLHDL 4.1 09/06/2022 1129   VLDL 34.0 12/08/2020 1432   LDLCALC 89 09/06/2022 1129    CBC    Component Value Date/Time   WBC 13.8 (H) 10/31/2022 0341   RBC 4.32 10/31/2022 0341   HGB 12.2 10/31/2022 0341   HGB 15.6 03/16/2018 1537   HCT 38.0 10/31/2022 0341  HCT 47.5 (H) 03/16/2018 1537   PLT 266 10/31/2022 0341   PLT 314 03/16/2018 1537   MCV 88.0 10/31/2022 0341   MCV 88 03/16/2018 1537   MCV 88 05/06/2012 0949   MCH 28.2 10/31/2022 0341   MCHC 32.1 10/31/2022 0341   RDW 14.1 10/31/2022 0341   RDW 14.1 03/16/2018 1537   RDW 13.7  05/06/2012 0949   LYMPHSABS 1.7 10/31/2022 0341   LYMPHSABS 5.1 (H) 02/28/2017 1604   MONOABS 0.7 10/31/2022 0341   EOSABS 0.0 10/31/2022 0341   EOSABS 0.1 02/28/2017 1604   BASOSABS 0.0 10/31/2022 0341   BASOSABS 0.1 02/28/2017 1604    Hgb A1C Lab Results  Component Value Date   HGBA1C 8.8 (A) 09/06/2022           Assessment & Plan:   ER follow-up for Multifocal Pneumonia with Acute Hypoxic Respiratory Failure:  ER notes, labs and imaging reviewed She has finished her antibiotics She has a few days of steroids left She declines refill of cough suppressants at this time She will follow up with pulmonology as scheduled  RTC in 1 month for follow-up of chronic conditions Webb Silversmith, NP

## 2022-11-08 NOTE — Telephone Encounter (Signed)
RECEIVED PT PORTION FROM PT. I HAVE FAXED PCP page(s) to office. Please complete and sign and fax TO [phone] ATTENTION Jazmin Vensel.    [facility] Patient Advocate

## 2022-11-12 NOTE — Telephone Encounter (Signed)
Received provider portion   Submitted application for OZEMPIC to [place] for patient assistance.   Phone: [facility] Patient Advocate

## 2022-11-14 ENCOUNTER — Other Ambulatory Visit: Payer: Self-pay

## 2022-11-14 DIAGNOSIS — Z87891 Personal history of nicotine dependence: Secondary | ICD-10-CM

## 2022-11-14 DIAGNOSIS — Z122 Encounter for screening for malignant neoplasm of respiratory organs: Secondary | ICD-10-CM

## 2022-11-19 ENCOUNTER — Other Ambulatory Visit: Payer: Self-pay | Admitting: Internal Medicine

## 2022-11-19 NOTE — Telephone Encounter (Signed)
Requested medication (s) are due for refill today: yes  Requested medication (s) are on the active medication list: yes  Last refill:  09/06/22 and 07/19/22  Future visit scheduled: yes  Notes to clinic:  Unable to refill per protocol, cannot delegate.      Requested Prescriptions  Pending Prescriptions Disp Refills   meclizine (ANTIVERT) 25 MG tablet [Pharmacy Med Name: MECLIZINE TABS '25MG'$ ] 30 tablet 35    Sig: TAKE 1 TABLET THREE TIMES A DAY AS NEEDED FOR DIZZINESS     Not Delegated - Gastroenterology: Antiemetics Failed - 11/19/2022 10:40 AM      Failed - This refill cannot be delegated      Passed - Valid encounter within last 6 months    Recent Outpatient Visits           1 week ago Multifocal pneumonia   [facility] [place], [place] W, NP   2 months ago Type 2 diabetes mellitus with hyperglycemia, with long-term current use of insulin ([place])   [facility], [name], NP   5 months ago Encounter for general adult medical examination with abnormal findings   [facility] [place], [name], NP   9 months ago Type 2 diabetes mellitus with hyperglycemia, with long-term current use of insulin [facility])   [facility] [place], [name], NP   12 months ago Type 2 diabetes mellitus with hyperglycemia, with long-term current use of insulin ([place])   [facility], [name], NP       Future Appointments             In 3 weeks Garnette Gunner, [name], NP [facility], [place]             clonazePAM (KLONOPIN) 0.5 MG tablet [Pharmacy Med Name: CLONAZEPAM TABS 0.'5MG'$ ] 45 tablet 0    Sig: TAKE ONE-HALF (1/2) TO ONE TABLET TWICE A DAY AS NEEDED FOR ANXIETY     Not Delegated - Psychiatry: Anxiolytics/Hypnotics 2 Failed - 11/19/2022 10:40 AM      Failed - This refill cannot be delegated      Failed - Urine Drug Screen completed in last 360 days      Passed - Patient is not pregnant      Passed -  Valid encounter within last 6 months    Recent Outpatient Visits           1 week ago Multifocal pneumonia   [facility] [place], [place] W, NP   2 months ago Type 2 diabetes mellitus with hyperglycemia, with long-term current use of insulin [facility])   [facility], [name], NP   5 months ago Encounter for general adult medical examination with abnormal findings   [facility] [place], [name], NP   9 months ago Type 2 diabetes mellitus with hyperglycemia, with long-term current use of insulin [facility])   [facility] [place], [name], NP   12 months ago Type 2 diabetes mellitus with hyperglycemia, with long-term current use of insulin [facility])   [facility] [place], [name], NP       Future Appointments             In 3 weeks Garnette Gunner, [name], NP [facility], [facility]

## 2022-12-02 ENCOUNTER — Telehealth: Payer: Medicare Other

## 2022-12-02 ENCOUNTER — Ambulatory Visit: Payer: Self-pay

## 2022-12-02 DIAGNOSIS — I10 Essential (primary) hypertension: Secondary | ICD-10-CM

## 2022-12-02 DIAGNOSIS — Z794 Long term (current) use of insulin: Secondary | ICD-10-CM

## 2022-12-02 NOTE — Patient Instructions (Addendum)
Please call the care guide team at [phone] if you need to cancel or reschedule your appointment.   If you are experiencing a Mental Health or [place] or need someone to talk to, please call the Suicide and Crisis Lifeline: 988 call the [place] National Suicide Prevention Lifeline: (445) 071-1742 or TTY: 920-718-8327 TTY (705)561-1206) to talk to a trained counselor call 1-800-273-TALK (toll free, 24 hour hotline)   Following is a copy of the CCM Program Consent:  CCM service includes personalized support from designated clinical staff supervised by the physician, including individualized plan of care and coordination with other care providers 24/7 contact phone numbers for assistance for urgent and routine care needs. Service will only be billed when office clinical staff spend 20 minutes or more in a month to coordinate care. Only one practitioner may furnish and bill the service in a calendar month. The patient may stop CCM services at amy time (effective at the end of the month) by phone call to the office staff. The patient will be responsible for cost sharing (co-pay) or up to 20% of the service fee (after annual deductible is met)  Following is a copy of your full provider care plan:   Goals Addressed             This Visit's Progress    CCM Expected Outcome:  Monitor, Self-Manage and Reduce Symptoms of Diabetes       Current Barriers:  Knowledge Deficits related to the importance of blood sugars in range to prevent complications from DM Care Coordination needs related to cost constraints of M[employer] and her insurance not covering Mounjaro in a patient with DM Chronic Disease Management support and education needs related to effective management of DM Financial Constraints.  Last eye exam > 1 year ago in July  Lab Results  Component Value Date   HGBA1C 8.8 (A) 09/06/2022     Planned Interventions: Provided education to patient about basic DM disease  process. Review of DM and the patient has had some fluctuations in A1C over the last several years. Review of goal of A1C of <7.0. The patient will likely have new blood work coming up at upcoming visit with pcp on 12-13-2022; Reviewed medications with patient and discussed importance of medication adherence. The patient works with the pharm D for medication adherence and education. The patient states she has been approved for assistance to get Ozempic. The patient just received her letter. Does not know if it comes to the office or to her home. Has upcoming appointment with pharm D on 12-09-2022. A message sent to the pharm D as well. She states she is happy that she is going to get the Ozempic with help through 2024. ;        Reviewed prescribed diet with patient heart healthy/ADA diet. Education given. The patient states her biggest meal of the day is Supper. She has been eating a lot of salads but states that her sugars are a little high after eating salads. Review of dressing and this may be the reason. Discussed bedtime snacks. The patient does not eat a bedtime snack. Education provided that she may want to start eating a bedtime snack as this may help with regulation of her blood sugars. Will attach information to the AVS; Counseled on importance of regular laboratory monitoring as prescribed. Last A1C was in July of 2023. Likely new bloodwork coming up at next provider visit;        Discussed plans with patient  for ongoing care management follow up and provided patient with direct contact information for care management team;      Provided patient with written educational materials related to hypo and hyperglycemia and importance of correct treatment. Review and educatio.       Advised patient, providing education and rationale, to check cbg twice daily and when you have symptoms of low or high blood sugar and record. The patient states her blood sugar range is 150 to 200. Education on the goal of fasting  <130 or less and the goal of post prandial of <180.  The patients states her most recent A1C was 8.8. Knows she needs to get it down. Education and support given        call provider for findings outside established parameters;       Referral made to pharmacy team for assistance with cost constraints and side effects of medications. Is working with the pharm D and has been approved for Ozempic   Review of patient status, including review of consultants reports, relevant laboratory and other test results, and medications completed;       Advised patient to discuss changes in her DM health and well being with provider;      Screening for signs and symptoms of depression related to chronic disease state;        Assessed social determinant of health barriers;   The patient states she is overdue for eye exam. Plans to call and get one scheduled in the next couple of months. Encouraged the patient to have yearly exams. Does check her feet daily for scraps, scratches, cuts, sores. Review of keeping feet clean and dry.        Symptom Management: Take medications as prescribed   Attend all scheduled provider appointments Call provider office for new concerns or questions  call the Suicide and Crisis Lifeline: 988 call the [place] National Suicide Prevention Lifeline: 308-483-7068 or TTY: 702 262 5622 TTY 7252799535) to talk to a trained counselor call 1-800-273-TALK (toll free, 24 hour hotline) if experiencing a Mental Health or [place]  schedule appointment with eye doctor check feet daily for cuts, sores or redness trim toenails straight across manage portion size wash and dry feet carefully every day wear comfortable, cotton socks wear comfortable, well-fitting shoes  Follow Up Plan: Telephone follow up appointment with care management team member scheduled for: 01-20-2023 at 0900 am       CCM Expected Outcome:  Monitor, Self-Manage, and Reduce Symptoms of Hypertension        Current Barriers:  Knowledge Deficits related to the need to have normalized blood pressures to prevent the risk of heart attack or stroke Chronic Disease Management support and education needs related to effective management of HTN BP Readings from Last 3 Encounters:  11/08/22 116/68  10/31/22 114/76  09/06/22 124/84     Planned Interventions: Evaluation of current treatment plan related to hypertension self management and patient's adherence to plan as established by provider. The patient states her blood pressures are stable. Does not take at home. States that this is usually good for her. Knows when to call the provider for changes. Will continue to monitor.;   Provided education to patient re: stroke prevention, s/s of heart attack and stroke; Reviewed prescribed diet heart healthy/ADA diet. Education and review. She usually eats her main meal at supper time. States that she eats a lot of salads recently. Reviewed medications with patient and discussed importance of compliance. Is compliant with medications.  Works with the [employer]. Denies any issues with medication compliance;  Counseled on the importance of exercise goals with target of 150 minutes per week Discussed plans with patient for ongoing care management follow up and provided patient with direct contact information for care management team; Advised patient, providing education and rationale, to monitor blood pressure daily and record, calling PCP for findings outside established parameters. The patient states she checks her blood pressures when needed. States they are WNL. Denies any acute findings related to blood pressures ;  Reviewed scheduled/upcoming provider appointments including: Saw pcp post ED visit on 11-08-2022. Next appointment on 12-13-2022 at 11 am. Reminder provided today. Advised patient to discuss changes in her HTN and heart health  with provider; Provided education on prescribed diet heart healthy/ADA. Education and  support given. Review of healthy eating options ;  Discussed complications of poorly controlled blood pressure such as heart disease, stroke, circulatory complications, vision complications, kidney impairment, sexual dysfunction;  Screening for signs and symptoms of depression related to chronic disease state;  Assessed social determinant of health barriers;  Was in the ER after Christmas for respiratory issues and shortness of breath. Received antibiotics and prednisone. States she is feeling much better. Denies any new issues with her breathing or respiratory status.  Symptom Management: Take medications as prescribed   Attend all scheduled provider appointments Call provider office for new concerns or questions  call the Suicide and Crisis Lifeline: 988 call the [place] National Suicide Prevention Lifeline: 858 539 5085 or TTY: 740-318-2051 TTY 413-144-7270) to talk to a trained counselor call 1-800-273-TALK (toll free, 24 hour hotline) if experiencing a Mental Health or [place]  check blood pressure weekly learn about high blood pressure keep a blood pressure log take blood pressure log to all doctor appointments call doctor for signs and symptoms of high blood pressure develop an action plan for high blood pressure keep all doctor appointments take medications for blood pressure exactly as prescribed report new symptoms to your doctor  Follow Up Plan: Telephone follow up appointment with care management team member scheduled for: 01-20-2023 at 0900 am          Patient verbalizes understanding of instructions and care plan provided today and agrees to view in [place]. Active MyChart status and patient understanding of how to access instructions and care plan via MyChart confirmed with patient.     Telephone follow up appointment with care management team member scheduled for: 01-20-2023 at 0900 am  Diabetes Mellitus and Nutrition, Adult When you have diabetes, or  diabetes mellitus, it is very important to have healthy eating habits because your blood sugar (glucose) levels are greatly affected by what you eat and drink. Eating healthy foods in the right amounts, at about the same times every day, can help you: Manage your blood glucose. Lower your risk of heart disease. Improve your blood pressure. Reach or maintain a healthy weight. What can affect my meal plan? Every person with diabetes is different, and each person has different needs for a meal plan. Your health care provider may recommend that you work with a dietitian to make a meal plan that is best for you. Your meal plan may vary depending on factors such as: The calories you need. The medicines you take. Your weight. Your blood glucose, blood pressure, and cholesterol levels. Your activity level. Other health conditions you have, such as heart or kidney disease. How do carbohydrates affect me? Carbohydrates, also called carbs, affect your blood glucose  level more than any other type of food. Eating carbs raises the amount of glucose in your blood. It is important to know how many carbs you can safely have in each meal. This is different for every person. Your dietitian can help you calculate how many carbs you should have at each meal and for each snack. How does alcohol affect me? Alcohol can cause a decrease in blood glucose (hypoglycemia), especially if you use insulin or take certain diabetes medicines by mouth. Hypoglycemia can be a life-threatening condition. Symptoms of hypoglycemia, such as sleepiness, dizziness, and confusion, are similar to symptoms of having too much alcohol. Do not drink alcohol if: Your health care provider tells you not to drink. You are pregnant, may be pregnant, or are planning to become pregnant. If you drink alcohol: Limit how much you have to: 0-1 drink a day for women. 0-2 drinks a day for men. Know how much alcohol is in your drink. In the [place] one  drink equals one 12 oz bottle of beer (355 mL), one 5 oz glass of wine (148 mL), or one 1 oz glass of hard liquor (44 mL). Keep yourself hydrated with water, diet soda, or unsweetened iced tea. Keep in mind that regular soda, juice, and other mixers may contain a lot of sugar and must be counted as carbs. What are tips for following this plan?  Reading food labels Start by checking the serving size on the Nutrition Facts label of packaged foods and drinks. The number of calories and the amount of carbs, fats, and other nutrients listed on the label are based on one serving of the item. Many items contain more than one serving per package. Check the total grams (g) of carbs in one serving. Check the number of grams of saturated fats and trans fats in one serving. Choose foods that have a low amount or none of these fats. Check the number of milligrams (mg) of salt (sodium) in one serving. Most people should limit total sodium intake to less than 2,300 mg per day. Always check the nutrition information of foods labeled as "low-fat" or "nonfat." These foods may be higher in added sugar or refined carbs and should be avoided. Talk to your dietitian to identify your daily goals for nutrients listed on the label. Shopping Avoid buying canned, pre-made, or processed foods. These foods tend to be high in fat, sodium, and added sugar. Shop around the outside edge of the grocery store. This is where you will most often find fresh fruits and vegetables, bulk grains, fresh meats, and fresh dairy products. Cooking Use low-heat cooking methods, such as baking, instead of high-heat cooking methods, such as deep frying. Cook using healthy oils, such as olive, canola, or sunflower oil. Avoid cooking with butter, cream, or high-fat meats. Meal planning Eat meals and snacks regularly, preferably at the same times every day. Avoid going long periods of time without eating. Eat foods that are high in fiber, such as  fresh fruits, vegetables, beans, and whole grains. Eat 4-6 oz (112-168 g) of lean protein each day, such as lean meat, chicken, fish, eggs, or tofu. One ounce (oz) (28 g) of lean protein is equal to: 1 oz (28 g) of meat, chicken, or fish. 1 egg.  cup (62 g) of tofu. Eat some foods each day that contain healthy fats, such as avocado, nuts, seeds, and fish. What foods should I eat? Fruits Berries. Apples. Oranges. Peaches. Apricots. Plums. Grapes. Mangoes. Papayas. Pomegranates. Kiwi. Cherries.  Vegetables Leafy greens, including lettuce, spinach, kale, chard, collard greens, mustard greens, and cabbage. Beets. Cauliflower. Broccoli. Carrots. Green beans. Tomatoes. Peppers. Onions. Cucumbers. Brussels sprouts. Grains Whole grains, such as whole-wheat or whole-grain bread, crackers, tortillas, cereal, and pasta. Unsweetened oatmeal. Quinoa. Brown or wild rice. Meats and other proteins Seafood. Poultry without skin. Lean cuts of poultry and beef. Tofu. Nuts. Seeds. Dairy Low-fat or fat-free dairy products such as milk, yogurt, and cheese. The items listed above may not be a complete list of foods and beverages you can eat and drink. Contact a dietitian for more information. What foods should I avoid? Fruits Fruits canned with syrup. Vegetables Canned vegetables. Frozen vegetables with butter or cream sauce. Grains Refined white flour and flour products such as bread, pasta, snack foods, and cereals. Avoid all processed foods. Meats and other proteins Fatty cuts of meat. Poultry with skin. Breaded or fried meats. Processed meat. Avoid saturated fats. Dairy Full-fat yogurt, cheese, or milk. Beverages Sweetened drinks, such as soda or iced tea. The items listed above may not be a complete list of foods and beverages you should avoid. Contact a dietitian for more information. Questions to ask a health care provider Do I need to meet with a certified diabetes care and education  specialist? Do I need to meet with a dietitian? What number can I call if I have questions? When are the best times to check my blood glucose? Where to find more information: American Diabetes Association: diabetes.org Academy of Nutrition and Dietetics: eatright.[employer] of Diabetes and Digestive and Kidney Diseases: [employer] Association of Diabetes Care & Education Specialists: diabeteseducator.org Summary It is important to have healthy eating habits because your blood sugar (glucose) levels are greatly affected by what you eat and drink. It is important to use alcohol carefully. A healthy meal plan will help you manage your blood glucose and lower your risk of heart disease. Your health care provider may recommend that you work with a dietitian to make a meal plan that is best for you. This information is not intended to replace advice given to you by your health care provider. Make sure you discuss any questions you have with your health care provider. Document Revised: 05/24/2020 Document Reviewed: 05/24/2020 Elsevier Patient Education  [place]. Hypoglycemia Hypoglycemia is when the sugar (glucose) level in your blood is too low. Low blood sugar can happen to people who have diabetes and people who do not have diabetes. Low blood sugar can happen quickly, and it can be an emergency. What are the causes? This condition happens most often in people who have diabetes. It may be caused by: Diabetes medicine. Not eating enough, or not eating often enough. Doing more physical activity. Drinking alcohol on an empty stomach. If you do not have diabetes, this condition may be caused by: A tumor in the pancreas. Not eating enough, or not eating for long periods at a time (fasting). A very bad infection or illness. Problems after having weight loss (bariatric) surgery. Kidney failure or liver failure. Certain medicines. What increases the risk? This condition  is more likely to develop in people who: Have diabetes and take medicines to lower their blood sugar. Abuse alcohol. Have a very bad illness. What are the signs or symptoms? Mild Hunger. Sweating and feeling clammy. Feeling dizzy or light-headed. Being sleepy or having trouble sleeping. Feeling like you may vomit (nauseous). A fast heartbeat. A headache. Blurry vision. Mood changes, such as: Being grouchy. Feeling worried or  nervous (anxious). Tingling or loss of feeling (numbness) around your mouth, lips, or tongue. Moderate Confusion and poor judgment. Behavior changes. Weakness. Uneven heartbeat. Trouble with moving (coordination). Very low Very low blood sugar (severe hypoglycemia) is a medical emergency. It can cause: Fainting. Seizures. Loss of consciousness (coma). Death. How is this treated? Treating low blood sugar Low blood sugar is often treated by eating or drinking something that has sugar in it right away. The food or drink should contain 15 grams of a fast-acting carb (carbohydrate). Options include: 4 oz (120 mL) of fruit juice. 4 oz (120 mL) of regular soda (not diet soda). A few pieces of hard candy. Check food labels to see how many pieces to eat for 15 grams. 1 Tbsp (15 mL) of sugar or honey. 4 glucose tablets. 1 tube of glucose gel. Treating low blood sugar if you have diabetes If you can think clearly and swallow safely, follow the 15:15 rule: Take 15 grams of a fast-acting carb. Talk with your doctor about how much you should take. Always keep a source of fast-acting carb with you, such as: Glucose tablets (take 4 tablets). A few pieces of hard candy. Check food labels to see how many pieces to eat for 15 grams. 4 oz (120 mL) of fruit juice. 4 oz (120 mL) of regular soda (not diet soda). 1 Tbsp (15 mL) of honey or sugar. 1 tube of glucose gel. Check your blood sugar 15 minutes after you take the carb. If your blood sugar is still at or below 70  mg/dL (3.9 mmol/L), take 15 grams of a carb again. If your blood sugar does not go above 70 mg/dL (3.9 mmol/L) after 3 tries, get help right away. After your blood sugar goes back to normal, eat a meal or a snack within 1 hour.  Treating very low blood sugar If your blood sugar is below 54 mg/dL (3 mmol/L), you have very low blood sugar, or severe hypoglycemia. This is an emergency. Get medical help right away. If you have very low blood sugar and you cannot eat or drink, you will need to be given a hormone called glucagon. A family member or friend should learn how to check your blood sugar and how to give you glucagon. Ask your doctor if you need to have an emergency glucagon kit at home. Very low blood sugar may also need to be treated in a hospital. Follow these instructions at home: General instructions Take over-the-counter and prescription medicines only as told by your doctor. Stay aware of your blood sugar as told by your doctor. If you drink alcohol: Limit how much you have to: 0-1 drink a day for women who are not pregnant. 0-2 drinks a day for men. Know how much alcohol is in your drink. In the [place] one drink equals one 12 oz bottle of beer (355 mL), one 5 oz glass of wine (148 mL), or one 1 oz glass of hard liquor (44 mL). Be sure to eat food when you drink alcohol. Know that your body absorbs alcohol quickly. This may lead to low blood sugar later. Be sure to keep checking your blood sugar. Keep all follow-up visits. If you have diabetes:  Always have a fast-acting carb (15 grams) with you to treat low blood sugar. Follow your diabetes care plan as told by your doctor. Make sure you: Know the symptoms of low blood sugar. Check your blood sugar as often as told. Always check it before and after  exercise. Always check your blood sugar before you drive. Take your medicines as told. Follow your meal plan. Eat on time. Do not skip meals. Share your diabetes care plan  with: Your work or school. People you live with. Carry a card or wear jewelry that says you have diabetes. Where to find more information American Diabetes Association: www.diabetes.org Contact a doctor if: You have trouble keeping your blood sugar in your target range. You have low blood sugar often. Get help right away if: You still have symptoms after you eat or drink something that contains 15 grams of fast-acting carb, and you cannot get your blood sugar above 70 mg/dL by following the 15:15 rule. Your blood sugar is below 54 mg/dL (3 mmol/L). You have a seizure. You faint. These symptoms may be an emergency. Get help right away. Call your local emergency services (911 in the [place] Do not wait to see if the symptoms will go away. Do not drive yourself to the hospital. Summary Hypoglycemia happens when the level of sugar (glucose) in your blood is too low. Low blood sugar can happen to people who have diabetes and people who do not have diabetes. Low blood sugar can happen quickly, and it can be an emergency. Make sure you know the symptoms of low blood sugar and know how to treat it. Always keep a source of sugar (fast-acting carb) with you to treat low blood sugar. This information is not intended to replace advice given to you by your health care provider. Make sure you discuss any questions you have with your health care provider. Document Revised: 09/21/2020 Document Reviewed: 09/21/2020 Elsevier Patient Education  [place].  Hyperglycemia Hyperglycemia is when the sugar (glucose) level in your blood is too high. High blood sugar can happen to people who have or do not have diabetes. High blood sugar can happen quickly. It can be an emergency. What are the causes? If you have diabetes, high blood sugar may be caused by: Medicines that increase blood sugar or affect your control of diabetes. Getting less physical activity. Overeating. Being sick or injured or  having an infection. Having surgery. Stress. Not giving yourself enough insulin (if you are taking it). You may have high blood sugar because you have diabetes that has not been diagnosed yet. If you do not have diabetes, high blood sugar may be caused by: Certain medicines. Stress. A bad illness. An infection. Having surgery. Diseases of the pancreas. What increases the risk? This condition is more likely to develop in people who have risk factors for diabetes, such as: Having a family member with diabetes. Certain conditions in which the body's defense system (immune system) attacks itself. These are called autoimmune disorders. Being overweight. Not being active. Having a condition called insulin resistance. Having a history of: Prediabetes. Diabetes when pregnant. Polycystic ovarian syndrome (PCOS). What are the signs or symptoms? This condition may not cause symptoms. If you do have symptoms, they may include: Feeling more thirsty than normal. Needing to pee (urinate) more often than normal. Hunger. Feeling very tired. Blurry eyesight (vision). You may get other symptoms as the condition gets worse, such as: Dry mouth. Pain in your belly (abdomen). Not being hungry (loss of appetite). Breath that smells fruity. Weakness. Weight loss that is not planned. A tingling or numb feeling in your hands or feet. A headache. Cuts or bruises that heal slowly. How is this treated? Treatment depends on the cause of your condition. Treatment may include: Taking  medicine to control your blood sugar levels. Changing your medicine or dosage if you take insulin or other diabetes medicines. Lifestyle changes. These may include: Exercising more. Eating healthier foods. Losing weight. Treating an illness or infection. Checking your blood sugar more often. Stopping or reducing steroid medicines. If your condition gets very bad, you will need to be treated in the hospital. Follow  these instructions at home: General instructions Take over-the-counter and prescription medicines only as told by your doctor. Do not smoke or use any products that contain nicotine or tobacco. If you need help quitting, ask your doctor. If you drink alcohol: Limit how much you have to: 0-1 drink a day for women who are not pregnant. 0-2 drinks a day for men. Know how much alcohol is in a drink. In the [place] one drink equals one 12 oz bottle of beer (355 mL), one 5 oz glass of wine (148 mL), or one 1 oz glass of hard liquor (44 mL). Manage stress. If you need help with this, ask your doctor. Do exercises as told by your doctor. Keep all follow-up visits. Eating and drinking  Stay at a healthy weight. Make sure you drink enough fluid when you: Exercise. Get sick. Are in hot temperatures. Drink enough fluid to keep your pee (urine) pale yellow. If you have diabetes:  Know the symptoms of high blood sugar. Follow your diabetes management plan as told by your doctor. Make sure you: Take insulin and medicines as told. Follow your exercise plan. Follow your meal plan. Eat on time. Do not skip meals. Check your blood sugar as often as told. Make sure you check before and after exercise. If you exercise longer or in a different way, check your blood sugar more often. Follow your sick day plan whenever you cannot eat or drink normally. Make this plan ahead of time with your doctor. Share your diabetes management plan with people in your workplace, school, and household. Check your pee for ketones when you are ill and as told by your doctor. Carry a card or wear jewelry that says that you have diabetes. Where to find more information American Diabetes Association: www.diabetes.org Contact a doctor if: Your blood sugar level is at or above 240 mg/dL (13.3 mmol/L) for 2 days in a row. You have problems keeping your blood sugar in your target range. You have high blood pressure often. You  have signs of illness, such as: Feeling like you may vomit (feeling nauseous). Vomiting. A fever. Get help right away if: Your blood sugar monitor reads "high" even when you are taking insulin. You have trouble breathing. You have a change in how you think, feel, or act (mental status). You feel like you may vomit, and the feeling does not go away. You cannot stop vomiting. These symptoms may be an emergency. Get medical help right away. Call your local emergency services (911 in the [place] Do not wait to see if the symptoms will go away. Do not drive yourself to the hospital. Summary Hyperglycemia is when the sugar (glucose) level in your blood is too high. High blood sugar can happen to people who have or do not have diabetes. Make sure you drink enough fluids and follow your meal plan. Exercise as often as told by your doctor. Contact your doctor if you have problems keeping your blood sugar in your target range. This information is not intended to replace advice given to you by your health care provider. Make sure you discuss  any questions you have with your health care provider. Document Revised: 08/04/2020 Document Reviewed: 08/04/2020 Elsevier Patient Education  [place].

## 2022-12-02 NOTE — Chronic Care Management (AMB) (Signed)
Chronic Care Management   CCM RN Visit Note  12/02/2022 Name: Brittany Pruitt MRN: [id number] DOB: 1960-11-17  Subjective: Brittany Pruitt is a 62 y.o. year old female who is a primary care patient of [name], NP. The patient was referred to the Chronic Care Management team for assistance with care management needs subsequent to provider initiation of CCM services and plan of care.    Today's Visit:  Engaged with patient by telephone for follow up visit.        Goals Addressed             This Visit's Progress    CCM Expected Outcome:  Monitor, Self-Manage and Reduce Symptoms of Diabetes       Current Barriers:  Knowledge Deficits related to the importance of blood sugars in range to prevent complications from DM Care Coordination needs related to cost constraints of Mounjaro and her insurance not covering Mounjaro in a patient with DM Chronic Disease Management support and education needs related to effective management of DM Financial Constraints.  Last eye exam > 1 year ago in July  Lab Results  Component Value Date   HGBA1C 8.8 (A) 09/06/2022     Planned Interventions: Provided education to patient about basic DM disease process. Review of DM and the patient has had some fluctuations in A1C over the last several years. Review of goal of A1C of <7.0. The patient will likely have new blood work coming up at upcoming visit with pcp on 12-13-2022; Reviewed medications with patient and discussed importance of medication adherence. The patient works with the pharm D for medication adherence and education. The patient states she has been approved for assistance to get Ozempic. The patient just received her letter. Does not know if it comes to the office or to her home. Has upcoming appointment with pharm D on 12-09-2022. A message sent to the pharm D as well. She states she is happy that she is going to get the Ozempic with help through 2024. ;        Reviewed prescribed diet  with patient heart healthy/ADA diet. Education given. The patient states her biggest meal of the day is Supper. She has been eating a lot of salads but states that her sugars are a little high after eating salads. Review of dressing and this may be the reason. Discussed bedtime snacks. The patient does not eat a bedtime snack. Education provided that she may want to start eating a bedtime snack as this may help with regulation of her blood sugars. Will attach information to the AVS; Counseled on importance of regular laboratory monitoring as prescribed. Last A1C was in July of 2023. Likely new bloodwork coming up at next provider visit;        Discussed plans with patient for ongoing care management follow up and provided patient with direct contact information for care management team;      Provided patient with written educational materials related to hypo and hyperglycemia and importance of correct treatment. Review and educatio.       Advised patient, providing education and rationale, to check cbg twice daily and when you have symptoms of low or high blood sugar and record. The patient states her blood sugar range is 150 to 200. Education on the goal of fasting <130 or less and the goal of post prandial of <180.  The patients states her most recent A1C was 8.8. Knows she needs to get it down. Education and support given  call provider for findings outside established parameters;       Referral made to pharmacy team for assistance with cost constraints and side effects of medications. Is working with the pharm D and has been approved for Ozempic   Review of patient status, including review of consultants reports, relevant laboratory and other test results, and medications completed;       Advised patient to discuss changes in her DM health and well being with provider;      Screening for signs and symptoms of depression related to chronic disease state;        Assessed social determinant of  health barriers;   The patient states she is overdue for eye exam. Plans to call and get one scheduled in the next couple of months. Encouraged the patient to have yearly exams. Does check her feet daily for scraps, scratches, cuts, sores. Review of keeping feet clean and dry.        Symptom Management: Take medications as prescribed   Attend all scheduled provider appointments Call provider office for new concerns or questions  call the Suicide and Crisis Lifeline: 988 call the [place] National Suicide Prevention Lifeline: 820-382-9393 or TTY: (630) 846-2080 TTY 239-186-2544) to talk to a trained counselor call 1-800-273-TALK (toll free, 24 hour hotline) if experiencing a Mental Health or [place]  schedule appointment with eye doctor check feet daily for cuts, sores or redness trim toenails straight across manage portion size wash and dry feet carefully every day wear comfortable, cotton socks wear comfortable, well-fitting shoes  Follow Up Plan: Telephone follow up appointment with care management team member scheduled for: 01-20-2023 at 0900 am       CCM Expected Outcome:  Monitor, Self-Manage, and Reduce Symptoms of Hypertension       Current Barriers:  Knowledge Deficits related to the need to have normalized blood pressures to prevent the risk of heart attack or stroke Chronic Disease Management support and education needs related to effective management of HTN BP Readings from Last 3 Encounters:  11/08/22 116/68  10/31/22 114/76  09/06/22 124/84     Planned Interventions: Evaluation of current treatment plan related to hypertension self management and patient's adherence to plan as established by provider. The patient states her blood pressures are stable. Does not take at home. States that this is usually good for her. Knows when to call the provider for changes. Will continue to monitor.;   Provided education to patient re: stroke prevention, s/s of heart  attack and stroke; Reviewed prescribed diet heart healthy/ADA diet. Education and review. She usually eats her main meal at supper time. States that she eats a lot of salads recently. Reviewed medications with patient and discussed importance of compliance. Is compliant with medications. Works with the [employer] [place]. Denies any issues with medication compliance;  Counseled on the importance of exercise goals with target of 150 minutes per week Discussed plans with patient for ongoing care management follow up and provided patient with direct contact information for care management team; Advised patient, providing education and rationale, to monitor blood pressure daily and record, calling PCP for findings outside established parameters. The patient states she checks her blood pressures when needed. States they are WNL. Denies any acute findings related to blood pressures ;  Reviewed scheduled/upcoming provider appointments including: Saw pcp post ED visit on 11-08-2022. Next appointment on 12-13-2022 at 11 am. Reminder provided today. Advised patient to discuss changes in her HTN and heart health  with provider;  Provided education on prescribed diet heart healthy/ADA. Education and support given. Review of healthy eating options ;  Discussed complications of poorly controlled blood pressure such as heart disease, stroke, circulatory complications, vision complications, kidney impairment, sexual dysfunction;  Screening for signs and symptoms of depression related to chronic disease state;  Assessed social determinant of health barriers;  Was in the ER after Christmas for respiratory issues and shortness of breath. Received antibiotics and prednisone. States she is feeling much better. Denies any new issues with her breathing or respiratory status.  Symptom Management: Take medications as prescribed   Attend all scheduled provider appointments Call provider office for new concerns or questions  call the  Suicide and Crisis Lifeline: 988 call the [place] National Suicide Prevention Lifeline: 318-527-9293 or TTY: (775) 706-0238 TTY 801-429-7822) to talk to a trained counselor call 1-800-273-TALK (toll free, 24 hour hotline) if experiencing a Mental Health or [place]  check blood pressure weekly learn about high blood pressure keep a blood pressure log take blood pressure log to all doctor appointments call doctor for signs and symptoms of high blood pressure develop an action plan for high blood pressure keep all doctor appointments take medications for blood pressure exactly as prescribed report new symptoms to your doctor  Follow Up Plan: Telephone follow up appointment with care management team member scheduled for: 01-20-2023 at 0900 am          Plan:Telephone follow up appointment with care management team member scheduled for:  01-20-2023 at 0900 am  [place], MSN, CCM RN Care Manager  Chronic Care Management Direct Number: [phone]

## 2022-12-04 DIAGNOSIS — I10 Essential (primary) hypertension: Secondary | ICD-10-CM | POA: Diagnosis not present

## 2022-12-04 DIAGNOSIS — J449 Chronic obstructive pulmonary disease, unspecified: Secondary | ICD-10-CM

## 2022-12-04 DIAGNOSIS — Z794 Long term (current) use of insulin: Secondary | ICD-10-CM

## 2022-12-04 DIAGNOSIS — E1159 Type 2 diabetes mellitus with other circulatory complications: Secondary | ICD-10-CM | POA: Diagnosis not present

## 2022-12-09 ENCOUNTER — Ambulatory Visit (INDEPENDENT_AMBULATORY_CARE_PROVIDER_SITE_OTHER): Payer: Medicare Other | Admitting: Pharmacist

## 2022-12-09 DIAGNOSIS — E1165 Type 2 diabetes mellitus with hyperglycemia: Secondary | ICD-10-CM

## 2022-12-09 NOTE — Patient Instructions (Signed)
Visit Information  Thank you for taking time to visit with me today. Please don't hesitate to contact me if I can be of assistance to you before our next scheduled telephone appointment.  Following are the goals we discussed today:   Goals Addressed             This Visit's Progress    Pharmacy Goals       Our goal A1c is less than 7%. This corresponds with fasting sugars less than 130 and 2 hour after meal sugars less than 180. Please keep a log of your results when checking your blood sugar   Our goal bad cholesterol, or LDL, is less than 70 . This is why it is important to continue taking your simvastatin.  Please have your medications and blood sugar log with you for our next telephone call.   Wallace Cullens, PharmD, [place] [phone]          Our next appointment is by telephone on 01/10/2023 at 10:00 AM  Please call the care guide team at [phone] if you need to cancel or reschedule your appointment.    Patient verbalizes understanding of instructions and care plan provided today and agrees to view in [place]. Active MyChart status and patient understanding of how to access instructions and care plan via MyChart confirmed with patient.

## 2022-12-09 NOTE — Chronic Care Management (AMB) (Signed)
Chronic Care Management CCM Pharmacy Note  12/09/2022 Name:  Brittany Pruitt MRN:  [id number] DOB:  10/16/1961   Subjective: Brittany Pruitt is an 62 y.o. year old female who is a primary patient of [name], NP.  The CCM team was consulted for assistance with disease management and care coordination needs.    Engaged with patient by telephone for follow up visit for pharmacy case management and/or care coordination services.   Objective:  Medications Reviewed Today     Reviewed by Rennis Petty, RPH-CPP (Pharmacist) on 12/09/22 at 1016  Med List Status: <None>   Medication Order Taking? Sig Documenting Provider Last Dose Status Informant  acetaminophen (TYLENOL) 500 MG tablet [id number]  Take 500 mg by mouth every 6 (six) hours as needed for moderate pain. [provider]  Active Self  albuterol (VENTOLIN HFA) 108 (90 Base) MCG/ACT inhaler [id number] Yes Inhale 2 puffs into the lungs every 6 (six) hours as needed for wheezing or shortness of breath. [place] [name], DO Taking Active   busPIRone (BUSPAR) 5 MG tablet [id number]  Take 1 tablet (5 mg total) by mouth 2 (two) times daily. [name], NP  Active Self  citalopram (CELEXA) 20 MG tablet [id number]  Take 1 tablet (20 mg total) by mouth daily. [name], NP  Active Self  clonazePAM (KLONOPIN) 0.5 MG tablet [id number]  TAKE ONE-HALF (1/2) TO ONE TABLET TWICE A DAY AS NEEDED FOR ANXIETY [name], [name], NP  Active   gabapentin (NEURONTIN) 300 MG capsule [id number]  Take 1 capsule (300 mg total) by mouth 4 (four) times daily. [name], NP  Active Self  glipiZIDE (GLUCOTROL) 10 MG tablet [id number] Yes TAKE 1 TABLET BY MOUTH TWICE DAILY BEFORE A MEAL  Patient taking differently: Take 5 mg by mouth 2 (two) times daily before a meal. TAKE 1 TABLET BY MOUTH TWICE DAILY BEFORE A MEAL   [name], [name], NP Taking Active Self  insulin glargine (LANTUS SOLOSTAR) 100 UNIT/ML Solostar Pen [id number] Yes Inject  35 Units into the skin 2 (two) times daily. [name], NP Taking Active Self  ipratropium-albuterol (DUONEB) 0.5-2.5 (3) MG/3ML SOLN [id number] No Take 3 mLs by nebulization every 6 (six) hours as needed (shortness of breath or wheezing).  Patient not taking: Reported on 12/09/2022   [place] Eric J, DO Not Taking Active   lisinopril (ZESTRIL) 5 MG tablet [id number] Yes Take 1 tablet (5 mg total) by mouth daily. [name], NP Taking Active Self  meclizine (ANTIVERT) 25 MG tablet [id number]  TAKE 1 TABLET THREE TIMES A DAY AS NEEDED FOR DIZZINESS [name], [name], NP  Active   Multiple Vitamins-Minerals (CENTRUM SILVER 50+WOMEN PO) [id number]  Take 1 tablet by mouth daily. [provider]  Active Self  Omega-3 Fatty Acids (FISH OIL) 1000 MG CAPS [id number]  Take 1 capsule by mouth in the morning and at bedtime. [provider]  Active Self  omeprazole (PRILOSEC) 20 MG capsule [id number]  Take 1 capsule (20 mg total) by mouth daily. [name], NP  Active Self  Semaglutide,0.25 or 0.'5MG'$ /DOS, (OZEMPIC, 0.25 OR 0.5 MG/DOSE,) 2 MG/1.5ML SOPN [id number] Yes Inject 0.5 mg into the skin once a week. [provider] Taking Active Self  simvastatin (ZOCOR) 20 MG tablet [id number] Yes TAKE 1 TABLET BY MOUTH ONCE DAILY AT  [name], NP Taking Active Self            Pertinent Labs:  Lab Results  Component Value Date   HGBA1C 8.8 (A) 09/06/2022   Lab Results  Component Value Date   CHOL 155 09/06/2022   HDL 38 (L) 09/06/2022   LDLCALC 89 09/06/2022   TRIG 184 (H) 09/06/2022   CHOLHDL 4.1 09/06/2022   Lab Results  Component Value Date   CREATININE 0.57 10/31/2022   BUN 17 10/31/2022   NA 137 10/31/2022   K 4.1 10/31/2022   CL 105 10/31/2022   CO2 24 10/31/2022    BP Readings from Last 3 Encounters:  11/08/22 116/68  10/31/22 114/76  09/06/22 124/84   Pulse Readings from Last 3 Encounters:  11/08/22 66  10/31/22 72  09/06/22 64      SDOH:  (Social Determinants of Health) assessments and interventions performed:  SDOH Interventions    Flowsheet Row Chronic Care Management from 10/07/2022 in [facility] Office Visit from 09/06/2022 in [facility] Office Visit from 11/21/2021 in [facility] Office Visit from 08/21/2021 in [facility]  SDOH Interventions      Food Insecurity Interventions Intervention Not Indicated -- -- --  Housing Interventions Intervention Not Indicated -- -- --  Transportation Interventions Intervention Not Indicated -- -- --  Utilities Interventions Intervention Not Indicated -- -- --  Alcohol Usage Interventions Intervention Not Indicated (Score <7) -- -- --  Depression Interventions/Treatment  -- Currently on Treatment Currently on Treatment Medication  Financial Strain Interventions Other (Comment)  [cost constraints with obtaining medications- pharm referral in place] -- -- --  Physical Activity Interventions Intervention Not Indicated, Other (Comments)  [no structured activity, does work in her home and out in her yard] -- -- --  Stress Interventions Other (Comment)  [worries alot, education and support given, resources given about LCSW available if needed] -- -- --  Social Connections Interventions Other (Comment)  [has good support from her family] -- -- --       [place]  Review of patient past medical history, allergies, medications, health status, including review of consultants reports, laboratory and other test data, was performed as part of comprehensive evaluation and provision of chronic care management services.   Care Plan : General Pharmacy (Adult)  Updates made by Rennis Petty, RPH-CPP since 12/09/2022 12:00 AM     Problem: Disease Progression      Long-Range Goal: Disease Progression Prevented or Minimized   Start Date: 10/07/2022  Expected End Date:  01/05/2023  Recent Progress: On track  Priority: High  Note:   Current Barriers:  Unable to independently afford treatment regimen Reports cost of Mounjaro unaffordable as not covered through her [employer] Medicare plan Unable to achieve control of T2DM   Pharmacist Clinical Goal(s):  patient will verbalize ability to afford treatment regimen achieve control of T2DM as evidenced by A1C  through collaboration with PharmD and provider.    Interventions: 1:1 collaboration with [name], NP regarding development and update of comprehensive plan of care as evidenced by provider attestation and co-signature Inter-disciplinary care team collaboration (see longitudinal plan of care) Congratulate patient as she reports that she has maintained smoking cessation since quit in December Reports discontinued using Symbicort inhaler since discussion with PCP on 1/5, but continues to have albuterol inhaler on hand if needed for wheezing or shortness of breath.  Denies recent respiratory symptoms Note appointment with Pulmonology scheduled for 2/28  Diabetes:  Uncontrolled; current treatment: glipizide 10  mg - 1/2 tablet (5 mg) twice daily 30 minutes before breakfast and supper Lantus 35 units twice daily (reports self-increased back to this dose for blood sugar control) Ozempic 0.5 mg weekly (reports increased to current dose ~11/12/2022) Reports tolerating well Previous therapies tried: metformin (GI side effects) Recalls current glucose readings ranging:  Morning fasting: 80-110 After supper: 180-210 Attributes higher post-meal readings to nights when she does not have time to prepare as well-balanced  Denies recent hypoglycemia Statin: simvastatin 20 mg daily Encourage patient to have regular well-balanced meals and snacks spread throughout the day, while controlling carbohydrate portion sizes Reports she has noticed improvement in her appetite control/ability to limit portion sizes since  started Ozempic Review counseling on s/s of low blood sugar and how to treat lows Review rules of 15s - importance of using 15 grams of sugar to treat low, recheck blood sugar in 15 minutes, treat again if remains low or, if back to normal, having meal if mealtime or snack  Review examples of sources of 15 grams of sugar Encouraged patient to obtain glucose tablets to keep with her Exercise: reports recently limited due to limited strength since recovering/ busy with attending granddaughter's basketball games, but plans to moving more outdoors as weather improving Counsel patient to continue to monitor home blood sugar, keep log of results, have this record to review during upcoming appointments and to contact office sooner if needed for reading outside of established parameters or for symptoms such as any episodes of hypoglycemia Will collaborate with PCP  Medication Assistance: Collaborated with PCP and [facility] to apply for patient assistance for Ozempic from [employer] Today reports she received a letter from [employer] advising that she was approved for enrollment in [employer] assistance program for 2024 calendar year  Patient Goals/Self-Care Activities patient will:  - check glucose, document, and provide at future appointments - check blood pressure, document, and provide at future appointments - collaborate with provider on medication access solutions - engage in dietary modifications by having regular well-balanced meals and snacks spread throughout the day, while controlling carbohydrate portion sizes       Plan: Telephone follow up appointment with care management team member scheduled for:  01/10/2023 at 10:00 AM  Wallace Cullens, PharmD, [place] [phone]

## 2022-12-13 ENCOUNTER — Encounter: Payer: Self-pay | Admitting: Internal Medicine

## 2022-12-13 ENCOUNTER — Ambulatory Visit (INDEPENDENT_AMBULATORY_CARE_PROVIDER_SITE_OTHER): Payer: Medicare Other | Admitting: Internal Medicine

## 2022-12-13 VITALS — BP 106/62 | HR 80 | Temp 96.9°F | Wt 193.0 lb

## 2022-12-13 DIAGNOSIS — E6609 Other obesity due to excess calories: Secondary | ICD-10-CM

## 2022-12-13 DIAGNOSIS — E1142 Type 2 diabetes mellitus with diabetic polyneuropathy: Secondary | ICD-10-CM

## 2022-12-13 DIAGNOSIS — Z794 Long term (current) use of insulin: Secondary | ICD-10-CM

## 2022-12-13 DIAGNOSIS — E785 Hyperlipidemia, unspecified: Secondary | ICD-10-CM

## 2022-12-13 DIAGNOSIS — E1165 Type 2 diabetes mellitus with hyperglycemia: Secondary | ICD-10-CM

## 2022-12-13 DIAGNOSIS — E1169 Type 2 diabetes mellitus with other specified complication: Secondary | ICD-10-CM

## 2022-12-13 DIAGNOSIS — F419 Anxiety disorder, unspecified: Secondary | ICD-10-CM

## 2022-12-13 DIAGNOSIS — I10 Essential (primary) hypertension: Secondary | ICD-10-CM | POA: Diagnosis not present

## 2022-12-13 DIAGNOSIS — Z6833 Body mass index (BMI) 33.0-33.9, adult: Secondary | ICD-10-CM

## 2022-12-13 DIAGNOSIS — H8103 Meniere's disease, bilateral: Secondary | ICD-10-CM | POA: Diagnosis not present

## 2022-12-13 DIAGNOSIS — M8589 Other specified disorders of bone density and structure, multiple sites: Secondary | ICD-10-CM

## 2022-12-13 DIAGNOSIS — K219 Gastro-esophageal reflux disease without esophagitis: Secondary | ICD-10-CM

## 2022-12-13 DIAGNOSIS — F32A Depression, unspecified: Secondary | ICD-10-CM

## 2022-12-13 DIAGNOSIS — E349 Endocrine disorder, unspecified: Secondary | ICD-10-CM

## 2022-12-13 DIAGNOSIS — D751 Secondary polycythemia: Secondary | ICD-10-CM

## 2022-12-13 DIAGNOSIS — R519 Headache, unspecified: Secondary | ICD-10-CM

## 2022-12-13 DIAGNOSIS — J383 Other diseases of vocal cords: Secondary | ICD-10-CM

## 2022-12-13 LAB — POCT GLYCOSYLATED HEMOGLOBIN (HGB A1C): Hemoglobin A1C: 7.6 % — AB (ref 4.0–5.6)

## 2022-12-13 NOTE — Assessment & Plan Note (Signed)
C-Met and lipid profile today Encouraged her to consume low-fat diet Continue simvastatin and aspirin 

## 2022-12-13 NOTE — Assessment & Plan Note (Signed)
Continue calcium and vitamin D Encourage daily weightbearing exercise

## 2022-12-13 NOTE — Progress Notes (Signed)
Subjective:    Patient ID: Brittany Pruitt, female    DOB: Aug 18, 1961, 62 y.o.   MRN: [id number]  HPI  Patient presents to clinic today for 6-monthfollow-up of chronic conditions.  Anxiety and Depression: Chronic, managed on Citalopram, Buspirone and Clonazepam.  She is not currently seeing a therapist.  She denies SI/HI.  DM2 with Peripheral Neuropathy: Her last A1c was 8.8%, 09/2022.  She is taking Glipizide, Lantus, Ozempic and Gabapentin as prescribed.  Her sugars range 85-200.  She checks her feet routinely.  Her last eye exam was 11/2021.  Flu 09/2022.  Pneumovax 06/2019.  CClearviewx 2.  HTN: Her BP today is 106/62.  She is taking Lisinopril as prescribed.  ECG from 10/2022 reviewed.  HLD with Aortic Atherosclerosis: Her last LDL was 89, triglycerides 184, 09/2022.  She denies myalgias on Simvastatin.  She tries to consume a low-fat diet.  Frequent Headaches: Triggered by stress.  She takes Tylenol as needed with good relief of symptoms.  She does not follow with neurology.  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  She does not follow with ENT currently.  GERD: She is not sure what triggers this.  She denies breakthrough on Omeprazole.  There is no upper GI on file.  Mnire's Disease: Currently asymptomatic.  She takes Meclizine as needed.  She does not follow with ENT.  Leukocytosis: Her last WBC count was 13.8, 10/2022.  She does smoke.  She does not follow with hematology.  Osteopenia: She is taking Calcium and Vitamin D OTC.  She tries to get weightbearing exercise daily.  Bone density from 09/2021 reviewed.  Review of Systems   Past Medical History:  Diagnosis Date   Anxiety    Chicken pox    Depression    Diabetes mellitus without complication (HCC)    Frequent headaches    Hyperlipidemia     Current Outpatient Medications  Medication Sig Dispense Refill   acetaminophen (TYLENOL) 500 MG tablet Take 500 mg by mouth every 6 (six) hours as  needed for moderate pain.     albuterol (VENTOLIN HFA) 108 (90 Base) MCG/ACT inhaler Inhale 2 puffs into the lungs every 6 (six) hours as needed for wheezing or shortness of breath. 8 g 2   busPIRone (BUSPAR) 5 MG tablet Take 1 tablet (5 mg total) by mouth 2 (two) times daily. 180 tablet 1   citalopram (CELEXA) 20 MG tablet Take 1 tablet (20 mg total) by mouth daily. 90 tablet 1   clonazePAM (KLONOPIN) 0.5 MG tablet TAKE ONE-HALF (1/2) TO ONE TABLET TWICE A DAY AS NEEDED FOR ANXIETY 45 tablet 0   gabapentin (NEURONTIN) 300 MG capsule Take 1 capsule (300 mg total) by mouth 4 (four) times daily. 360 capsule 1   glipiZIDE (GLUCOTROL) 10 MG tablet TAKE 1 TABLET BY MOUTH TWICE DAILY BEFORE A MEAL (Patient taking differently: Take 5 mg by mouth 2 (two) times daily before a meal. TAKE 1 TABLET BY MOUTH TWICE DAILY BEFORE A MEAL) 180 tablet 1   insulin glargine (LANTUS SOLOSTAR) 100 UNIT/ML Solostar Pen Inject 35 Units into the skin 2 (two) times daily. 63 mL 0   ipratropium-albuterol (DUONEB) 0.5-2.5 (3) MG/3ML SOLN Take 3 mLs by nebulization every 6 (six) hours as needed (shortness of breath or wheezing). (Patient not taking: Reported on 12/09/2022) 360 mL 3   lisinopril (ZESTRIL) 5 MG tablet Take 1 tablet (5 mg total) by mouth daily. 90 tablet 1   meclizine (ANTIVERT) 25 MG  tablet TAKE 1 TABLET THREE TIMES A DAY AS NEEDED FOR DIZZINESS 90 tablet 0   Multiple Vitamins-Minerals (CENTRUM SILVER 50+WOMEN PO) Take 1 tablet by mouth daily.     Omega-3 Fatty Acids (FISH OIL) 1000 MG CAPS Take 1 capsule by mouth in the morning and at bedtime.     omeprazole (PRILOSEC) 20 MG capsule Take 1 capsule (20 mg total) by mouth daily. 90 capsule 1   Semaglutide,0.25 or 0.5MG/DOS, (OZEMPIC, 0.25 OR 0.5 MG/DOSE,) 2 MG/1.5ML SOPN Inject 0.5 mg into the skin once a week.     simvastatin (ZOCOR) 20 MG tablet TAKE 1 TABLET BY MOUTH ONCE DAILY AT  6PM 90 tablet 1   No current facility-administered medications for this visit.     Allergies  Allergen Reactions   Sulfa Antibiotics Hives    Family History  Problem Relation Age of Onset   Uterine cancer Mother    Breast cancer Mother 21   Pancreatic cancer Mother    Heart disease Father    Hypertension Father    Anxiety disorder Father    Diabetes Father    Uterine cancer Maternal Aunt    Breast cancer Maternal Aunt 25   Heart disease Paternal Grandfather     Social History   Socioeconomic History   Marital status: Married    Spouse name: Tannia Baragan   Number of children: Not on file   Years of education: Not on file   Highest education level: Not on file  Occupational History   Not on file  Tobacco Use   Smoking status: Former    Packs/day: 1.00    Years: 30.00    Total pack years: 30.00    Types: Cigarettes    Quit date: 10/26/2022    Years since quitting: 0.1   Smokeless tobacco: Never  Vaping Use   Vaping Use: Never used  Substance and Sexual Activity   Alcohol use: No    Alcohol/week: 0.0 standard drinks of alcohol   Drug use: Never   Sexual activity: Yes    Partners: Male  Other Topics Concern   Not on file  Social History Narrative   Pt lives with husband Legrand Como.  Is not currently working.    Social Determinants of Health   Financial Resource Strain: Low Risk  (10/07/2022)   Overall Financial Resource Strain (CARDIA)    Difficulty of Paying Living Expenses: Not very hard  Food Insecurity: No Food Insecurity (10/07/2022)   Hunger Vital Sign    Worried About Running Out of Food in the Last Year: Never true    Ran Out of Food in the Last Year: Never true  Transportation Needs: No Transportation Needs (10/07/2022)   PRAPARE - Hydrologist (Medical): No    Lack of Transportation (Non-Medical): No  Physical Activity: Inactive (10/07/2022)   Exercise Vital Sign    Days of Exercise per Week: 0 days    Minutes of Exercise per Session: 0 min  Stress: No Stress Concern Present (10/07/2022)    [place]    Feeling of Stress : Only a little  Social Connections: Moderately Isolated (10/07/2022)   Social Connection and Isolation Panel [NHANES]    Frequency of Communication with Friends and Family: More than three times a week    Frequency of Social Gatherings with Friends and Family: More than three times a week    Attends Religious Services: Never    Active Member  of Clubs or Organizations: No    Attends Archivist Meetings: Never    Marital Status: Married  Human resources officer Violence: Not At Risk (10/07/2022)   Humiliation, Afraid, Rape, and Kick questionnaire    Fear of Current or Ex-Partner: No    Emotionally Abused: No    Physically Abused: No    Sexually Abused: No     Constitutional: Patient reports intermittent headaches.  Denies fever, malaise, fatigue, or abrupt weight changes.  HEENT: Denies eye pain, eye redness, ear pain, ringing in the ears, wax buildup, runny nose, nasal congestion, bloody nose, or sore throat. Respiratory: Denies difficulty breathing, shortness of breath, cough or sputum production.   Cardiovascular: Denies chest pain, chest tightness, palpitations or swelling in the hands or feet.  Gastrointestinal: Denies abdominal pain, bloating, constipation, diarrhea or blood in the stool.  GU: Denies urgency, frequency, pain with urination, burning sensation, blood in urine, odor or discharge. Musculoskeletal: Denies decrease in range of motion, difficulty with gait, muscle pain or joint pain and swelling.  Skin: Denies redness, rashes, lesions or ulcercations.  Neurological: Patient reports neuropathy, difficulty with speech.  Denies dizziness, difficulty with memory, or problems with balance and coordination.  Psych: Patient has a history of anxiety and depression.  Denies SI/HI.  No other specific complaints in a complete review of systems (except as listed in HPI above).      Objective:   Physical Exam   BP 106/62 (BP Location: Left Arm, Patient Position: Sitting, Cuff Size: Normal)   Pulse 80   Temp (!) 96.9 F (36.1 C) (Temporal)   Wt 193 lb (87.5 kg)   SpO2 97%   BMI 33.13 kg/m   Wt Readings from Last 3 Encounters:  11/08/22 194 lb (88 kg)  10/29/22 192 lb (87.1 kg)  09/06/22 199 lb (90.3 kg)    General: Appears her stated age, obese, in NAD. Skin: Warm, dry and intact. No ulcerations noted. HEENT: Head: normal shape and size; Eyes: sclera white, no icterus, conjunctiva pink, PERRLA and EOMs intact;  Cardiovascular: Normal rate and rhythm. S1,S2 noted.  No murmur, rubs or gallops noted. No JVD or BLE edema. No carotid bruits noted. Pulmonary/Chest: Normal effort and positive vesicular breath sounds. No respiratory distress. No wheezes, rales or ronchi noted.  Abdomen: Normal bowel sound Musculoskeletal: No difficulty with gait.  Neurological: Alert and oriented. Coordination normal.  Psychiatric: Mood and affect normal. Behavior is normal. Judgment and thought content normal.    BMET    Component Value Date/Time   NA 137 10/31/2022 0341   NA 141 03/16/2018 1537   NA 137 05/06/2012 0949   K 4.1 10/31/2022 0341   K 4.5 05/06/2012 0949   CL 105 10/31/2022 0341   CL 103 05/06/2012 0949   CO2 24 10/31/2022 0341   CO2 27 05/06/2012 0949   GLUCOSE 342 (H) 10/31/2022 0341   GLUCOSE 123 (H) 05/06/2012 0949   BUN 17 10/31/2022 0341   BUN 13 03/16/2018 1537   BUN 10 05/06/2012 0949   CREATININE 0.57 10/31/2022 0341   CREATININE 0.61 09/06/2022 1129   CALCIUM 8.6 (L) 10/31/2022 0341   CALCIUM 9.2 05/06/2012 0949   GFRNONAA >60 10/31/2022 0341   GFRNONAA >60 05/06/2012 0949   GFRAA 103 03/16/2018 1537   GFRAA >60 05/06/2012 0949    Lipid Panel     Component Value Date/Time   CHOL 155 09/06/2022 1129   CHOL 135 02/28/2017 1604   TRIG 184 (H) 09/06/2022 1129  HDL 38 (L) 09/06/2022 1129   HDL 38 (L) 02/28/2017 1604   CHOLHDL 4.1  09/06/2022 1129   VLDL 34.0 12/08/2020 1432   LDLCALC 89 09/06/2022 1129    CBC    Component Value Date/Time   WBC 13.8 (H) 10/31/2022 0341   RBC 4.32 10/31/2022 0341   HGB 12.2 10/31/2022 0341   HGB 15.6 03/16/2018 1537   HCT 38.0 10/31/2022 0341   HCT 47.5 (H) 03/16/2018 1537   PLT 266 10/31/2022 0341   PLT 314 03/16/2018 1537   MCV 88.0 10/31/2022 0341   MCV 88 03/16/2018 1537   MCV 88 05/06/2012 0949   MCH 28.2 10/31/2022 0341   MCHC 32.1 10/31/2022 0341   RDW 14.1 10/31/2022 0341   RDW 14.1 03/16/2018 1537   RDW 13.7 05/06/2012 0949   LYMPHSABS 1.7 10/31/2022 0341   LYMPHSABS 5.1 (H) 02/28/2017 1604   MONOABS 0.7 10/31/2022 0341   EOSABS 0.0 10/31/2022 0341   EOSABS 0.1 02/28/2017 1604   BASOSABS 0.0 10/31/2022 0341   BASOSABS 0.1 02/28/2017 1604    Hgb A1C Lab Results  Component Value Date   HGBA1C 8.8 (A) 09/06/2022           Assessment & Plan:     RTC in 6 months, for your annual exam Webb Silversmith, NP

## 2022-12-13 NOTE — Assessment & Plan Note (Signed)
Continue clonazepam as needed. ?

## 2022-12-13 NOTE — Telephone Encounter (Signed)
Received notification from [employer] regarding approval for [place]. Patient assistance approved  11/04/2023.  Phone: [phone]

## 2022-12-13 NOTE — Assessment & Plan Note (Signed)
CBC today Encourage smoking cessation

## 2022-12-13 NOTE — Assessment & Plan Note (Signed)
Continue meclizine as needed

## 2022-12-13 NOTE — Patient Instructions (Signed)

## 2022-12-13 NOTE — Assessment & Plan Note (Signed)
Controlled on lisinopril Reinforced DASH diet and exercise for weight loss C-Met today 

## 2022-12-13 NOTE — Assessment & Plan Note (Signed)
Continue gabapentin.

## 2022-12-13 NOTE — Assessment & Plan Note (Signed)
Encourage diet and exercise for weight loss 

## 2022-12-13 NOTE — Assessment & Plan Note (Signed)
Stable on citalopram, buspirone and clonazepam Support offered

## 2022-12-13 NOTE — Assessment & Plan Note (Signed)
Avoid foods that trigger reflux Encourage weight loss as this can produce reflux symptoms Continue omeprazole

## 2022-12-13 NOTE — Assessment & Plan Note (Signed)
POCT A1c 7.6% We will check urine microalbumin today Continue glipizide, Lantus, Ozempic and gabapentin Eye exam is scheduled Encouraged routine foot exam Flu shot UTD Pneumovax UTD Encouraged her to get her COVID booster

## 2022-12-13 NOTE — Assessment & Plan Note (Signed)
Encourage stress reduction techniques Continue Tylenol as needed

## 2022-12-14 LAB — COMPLETE METABOLIC PANEL WITH GFR
AG Ratio: 1.7 (calc) (ref 1.0–2.5)
ALT: 13 U/L (ref 6–29)
AST: 16 U/L (ref 10–35)
Albumin: 4.3 g/dL (ref 3.6–5.1)
Alkaline phosphatase (APISO): 49 U/L (ref 37–153)
BUN: 8 mg/dL (ref 7–25)
CO2: 26 mmol/L (ref 20–32)
Calcium: 9.6 mg/dL (ref 8.6–10.4)
Chloride: 103 mmol/L (ref 98–110)
Creat: 0.65 mg/dL (ref 0.50–1.05)
Globulin: 2.6 g/dL (calc) (ref 1.9–3.7)
Glucose, Bld: 88 mg/dL (ref 65–99)
Potassium: 4.9 mmol/L (ref 3.5–5.3)
Sodium: 140 mmol/L (ref 135–146)
Total Bilirubin: 0.4 mg/dL (ref 0.2–1.2)
Total Protein: 6.9 g/dL (ref 6.1–8.1)
eGFR: 99 mL/min/{1.73_m2} (ref >=60)

## 2022-12-14 LAB — CBC
HCT: 45.3 % — ABNORMAL HIGH (ref 35.0–45.0)
Hemoglobin: 15.1 g/dL (ref 11.7–15.5)
MCH: 29.7 pg (ref 27.0–33.0)
MCHC: 33.3 g/dL (ref 32.0–36.0)
MCV: 89 fL (ref 80.0–100.0)
MPV: 11.1 fL (ref 7.5–12.5)
Platelets: 286 10*3/uL (ref 140–400)
RBC: 5.09 10*6/uL (ref 3.80–5.10)
RDW: 14 % (ref 11.0–15.0)
WBC: 11.3 10*3/uL — ABNORMAL HIGH (ref 3.8–10.8)

## 2022-12-14 LAB — LIPID PANEL
Cholesterol: 129 mg/dL (ref <200)
HDL: 36 mg/dL — ABNORMAL LOW (ref >=50)
LDL Cholesterol (Calc): 64 mg/dL (calc)
Non-HDL Cholesterol (Calc): 93 mg/dL (calc) (ref <130)
Total CHOL/HDL Ratio: 3.6 (calc) (ref <5.0)
Triglycerides: 233 mg/dL — ABNORMAL HIGH (ref <150)

## 2022-12-14 LAB — MICROALBUMIN / CREATININE URINE RATIO
Creatinine, Urine: 110 mg/dL (ref 20–275)
Microalb Creat Ratio: 4 mcg/mg creat (ref <30)
Microalb, Ur: 0.4 mg/dL

## 2022-12-29 ENCOUNTER — Other Ambulatory Visit: Payer: Self-pay | Admitting: Internal Medicine

## 2022-12-30 NOTE — Telephone Encounter (Signed)
Requested medications are due for refill today.  Provider to determine  Requested medications are on the active medications list.  yes  Last refill. 11/19/2022 for both   Future visit scheduled.   no  Notes to clinic.  Refill not delegated.    Requested Prescriptions  Pending Prescriptions Disp Refills   meclizine (ANTIVERT) 25 MG tablet [Pharmacy Med Name: MECLIZINE TABS '25MG'$ ] 90 tablet 11    Sig: TAKE 1 TABLET THREE TIMES A DAY AS NEEDED FOR DIZZINESS     Not Delegated - Gastroenterology: Antiemetics Failed - 12/29/2022  2:34 PM      Failed - This refill cannot be delegated      Passed - Valid encounter within last 6 months    Recent Outpatient Visits           2 weeks ago Type 2 diabetes mellitus with hyperglycemia, with long-term current use of insulin [facility])   [facility] [place], [name], NP   1 month ago Multifocal pneumonia   [facility] [place], [place] W, NP   3 months ago Type 2 diabetes mellitus with hyperglycemia, with long-term current use of insulin [facility])   [facility] [place], [name], NP   7 months ago Encounter for general adult medical examination with abnormal findings   [facility] [place], [place] W, NP   10 months ago Type 2 diabetes mellitus with hyperglycemia, with long-term current use of insulin [facility])   [facility] [place], [name], NP               clonazePAM (KLONOPIN) 0.5 MG tablet [Pharmacy Med Name: CLONAZEPAM TABS 0.'5MG'$ ] 45 tablet 0    Sig: TAKE ONE-HALF (1/2) TO ONE TABLET TWICE A DAY AS NEEDED FOR ANXIETY     Not Delegated - Psychiatry: Anxiolytics/Hypnotics 2 Failed - 12/29/2022  2:34 PM      Failed - This refill cannot be delegated      Failed - Urine Drug Screen completed in last 360 days      Passed - Patient is not pregnant      Passed - Valid encounter within last 6 months    Recent Outpatient  Visits           2 weeks ago Type 2 diabetes mellitus with hyperglycemia, with long-term current use of insulin [facility])   [facility] [place], [name], NP   1 month ago Multifocal pneumonia   [facility] [place], [place] W, NP   3 months ago Type 2 diabetes mellitus with hyperglycemia, with long-term current use of insulin [facility])   [facility] [place], [name], NP   7 months ago Encounter for general adult medical examination with abnormal findings   [facility] [place], [place] W, NP   10 months ago Type 2 diabetes mellitus with hyperglycemia, with long-term current use of insulin [facility])   [facility] [place], [name], [place]

## 2023-01-01 ENCOUNTER — Encounter: Payer: Self-pay | Admitting: Acute Care

## 2023-01-01 ENCOUNTER — Ambulatory Visit (INDEPENDENT_AMBULATORY_CARE_PROVIDER_SITE_OTHER): Payer: Medicare Other | Admitting: Acute Care

## 2023-01-01 DIAGNOSIS — F17210 Nicotine dependence, cigarettes, uncomplicated: Secondary | ICD-10-CM

## 2023-01-01 NOTE — Patient Instructions (Signed)
Thank you for participating in the [facility]. It was our pleasure to meet you today. We will call you with the results of your scan within the next few days. Your scan will be assigned a Lung RADS category score by the physicians reading the scans.  This Lung RADS score determines follow up scanning.  See below for description of categories, and follow up screening recommendations. We will be in touch to schedule your follow up screening annually or based on recommendations of our providers. We will fax a copy of your scan results to your Primary Care Physician, or the physician who referred you to the program, to ensure they have the results. Please call the office if you have any questions or concerns regarding your scanning experience or results.  Our office number is [phone]. Please speak with Brittany Phelps, RN. , or  Brittany Buckner RN, They are  our Lung Cancer Screening RN.'s If They are unavailable when you call, Please leave a message on the voice mail. We will return your call at our earliest convenience.This voice mail is monitored several times a day.  Remember, if your scan is normal, we will scan you annually as long as you continue to meet the criteria for the program. (Age 50-80, Current smoker or smoker who has quit within the last 15 years). If you are a smoker, remember, quitting is the single most powerful action that you can take to decrease your risk of lung cancer and other pulmonary, breathing related problems. We know quitting is hard, and we are here to help.  Please let us know if there is anything we can do to help you meet your goal of quitting. If you are a former smoker, congratulations. We are proud of you! Remain smoke free! Remember you can refer friends or family members through the number above.  We will screen them to make sure they meet criteria for the program. Thank you for helping us take better care of you by  participating in Lung Screening.  You can receive free nicotine replacement therapy ( patches, gum or mints) by calling 1-800-QUIT NOW. Please call so we can get you on the path to becoming  a non-smoker. I know it is hard, but you can do this!  Lung RADS Categories:  Lung RADS 1: no nodules or definitely non-concerning nodules.  Recommendation is for a repeat annual scan in 12 months.  Lung RADS 2:  nodules that are non-concerning in appearance and behavior with a very low likelihood of becoming an active cancer. Recommendation is for a repeat annual scan in 12 months.  Lung RADS 3: nodules that are probably non-concerning , includes nodules with a low likelihood of becoming an active cancer.  Recommendation is for a 6-month repeat screening scan. Often noted after an upper respiratory illness. We will be in touch to make sure you have no questions, and to schedule your 6-month scan.  Lung RADS 4 A: nodules with concerning findings, recommendation is most often for a follow up scan in 3 months or additional testing based on our provider's assessment of the scan. We will be in touch to make sure you have no questions and to schedule the recommended 3 month follow up scan.  Lung RADS 4 B:  indicates findings that are concerning. We will be in touch with you to schedule additional diagnostic testing based on our provider's  assessment of the scan.  Other options for assistance in smoking cessation (   As covered by your insurance benefits)  Hypnosis for smoking cessation  [employer] [phone]  Acupuncture for smoking cessation  [facility] [phone]   

## 2023-01-01 NOTE — Progress Notes (Signed)
Virtual Visit via Telephone Note  I connected with Waymon Budge on 01/01/23 at  9:00 AM EST by telephone and verified that I am speaking with the correct person using two identifiers.  Location: Patient:  At home Provider:  [place], [place], [place], [address]    I discussed the limitations, risks, security and privacy concerns of performing an evaluation and management service by telephone and the availability of in person appointments. I also discussed with the patient that there may be a patient responsible charge related to this service. The patient expressed understanding and agreed to proceed.  Pt became a current every day smoker again January of 2024.  She has had weight loss of 12 pounds since 10/2022  secondary to starting Ozempic for her DM.    Shared Decision Making Visit Lung Cancer Screening Program [phone])   Eligibility: Age 62 y.o. Pack Years Smoking History Calculation 30 pack year smoking history (# packs/per year x # years smoked) Recent History of coughing up blood  no Unexplained weight loss? no ( >Than 15 pounds within the last 6 months ) Prior History Lung / other cancer no (Diagnosis within the last 5 years already requiring surveillance chest CT Scans). Smoking Status Current Smoker Former Smokers: Years since quit:  NA  Quit Date:  NA  Visit Components: Discussion included one or more decision making aids. yes Discussion included risk/benefits of screening. yes Discussion included potential follow up diagnostic testing for abnormal scans. yes Discussion included meaning and risk of over diagnosis. yes Discussion included meaning and risk of False Positives. yes Discussion included meaning of total radiation exposure. yes  Counseling Included: Importance of adherence to annual lung cancer LDCT screening. yes Impact of comorbidities on ability to participate in the program. yes Ability and willingness to under diagnostic treatment.  yes  Smoking Cessation Counseling: Current Smokers:  Discussed importance of smoking cessation. yes Information about tobacco cessation classes and interventions provided to patient. yes Patient provided with "ticket" for LDCT Scan. yes Symptomatic Patient. no  Counseling NA Diagnosis Code: Tobacco Use Z72.0 Asymptomatic Patient yes  Counseling (Intermediate counseling: > three minutes counseling) [id number] Former Smokers:  Discussed the importance of maintaining cigarette abstinence. yes Diagnosis Code: Personal History of Nicotine Dependence. [id number] Information about tobacco cessation classes and interventions provided to patient. Yes Patient provided with "ticket" for LDCT Scan. yes Written Order for Lung Cancer Screening with LDCT placed in Epic. Yes (CT Chest Lung Cancer Screening Low Dose W/O CM) [id number] Z12.2-Screening of respiratory organs Z87.891-Personal history of nicotine dependence  I have spent 25 minutes of face to face/ virtual visit   time with  Ms. Murrah discussing the risks and benefits of lung cancer screening. We viewed / discussed a power point together that explained in detail the above noted topics. We paused at intervals to allow for questions to be asked and answered to ensure understanding.We discussed that the single most powerful action that she can take to decrease her risk of developing lung cancer is to quit smoking. We discussed whether or not she is ready to commit to setting a quit date. We discussed options for tools to aid in quitting smoking including nicotine replacement therapy, non-nicotine medications, support groups, Quit Smart classes, and behavior modification. We discussed that often times setting smaller, more achievable goals, such as eliminating 1 cigarette a day for a week and then 2 cigarettes a day for a week can be helpful in slowly decreasing the number of cigarettes smoked.  This allows for a sense of accomplishment as well as providing a  clinical benefit. I provided  her  with smoking cessation  information  with contact information for community resources, classes, free nicotine replacement therapy, and access to mobile apps, text messaging, and on-line smoking cessation help. I have also provided  her  the office contact information in the event she needs to contact me, or the screening staff. We discussed the time and location of the scan, and that either Doroteo Glassman RN, Joella Prince, RN  or I will call / send a letter with the results within 24-72 hours of receiving them. The patient verbalized understanding of all of  the above and had no further questions upon leaving the office. They have my contact information in the event they have any further questions.  I spent 3 minutes counseling on smoking cessation and the health risks of continued tobacco abuse.  I explained to the patient that there has been a high incidence of coronary artery disease noted on these exams. I explained that this is a non-gated exam therefore degree or severity cannot be determined. This patient is on statin therapy. I have asked the patient to follow-up with their PCP regarding any incidental finding of coronary artery disease and management with diet or medication as their PCP  feels is clinically indicated. The patient verbalized understanding of the above and had no further questions upon completion of the visit.      Magdalen Spatz, NP 01/01/2023

## 2023-01-02 DIAGNOSIS — E1165 Type 2 diabetes mellitus with hyperglycemia: Secondary | ICD-10-CM

## 2023-01-02 DIAGNOSIS — Z794 Long term (current) use of insulin: Secondary | ICD-10-CM

## 2023-01-10 ENCOUNTER — Encounter: Payer: Self-pay | Admitting: Pharmacist

## 2023-01-10 ENCOUNTER — Ambulatory Visit (INDEPENDENT_AMBULATORY_CARE_PROVIDER_SITE_OTHER): Payer: Medicare Other | Admitting: Pharmacist

## 2023-01-10 DIAGNOSIS — Z794 Long term (current) use of insulin: Secondary | ICD-10-CM

## 2023-01-10 NOTE — Chronic Care Management (AMB) (Signed)
Chronic Care Management CCM Pharmacy Note  01/10/2023 Name:  Brittany Pruitt MRN:  [id number] DOB:  11-23-1960  Subjective: Brittany Pruitt is an 62 y.o. year old female who is a primary patient of [name], NP.  The CCM team was consulted for assistance with disease management and care coordination needs.    Engaged with patient by telephone for follow up visit for pharmacy case management and/or care coordination services.   Objective:  Medications Reviewed Today     Reviewed by Rennis Petty, RPH-CPP (Pharmacist) on 01/10/23 at 1515  Med List Status: <None>   Medication Order Taking? Sig Documenting Provider Last Dose Status Informant  acetaminophen (TYLENOL) 500 MG tablet [id number]  Take 500 mg by mouth every 6 (six) hours as needed for moderate pain. [provider]  Active Self  albuterol (VENTOLIN HFA) 108 (90 Base) MCG/ACT inhaler [id number]  Inhale 2 puffs into the lungs every 6 (six) hours as needed for wheezing or shortness of breath. [place] [name], DO  Active   busPIRone (BUSPAR) 5 MG tablet [id number]  Take 1 tablet (5 mg total) by mouth 2 (two) times daily. [name], NP  Active Self  citalopram (CELEXA) 20 MG tablet [id number]  Take 1 tablet (20 mg total) by mouth daily. [name], NP  Active Self  clonazePAM (KLONOPIN) 0.5 MG tablet [id number]  TAKE ONE-HALF (1/2) TO ONE TABLET TWICE A DAY AS NEEDED FOR ANXIETY [name], [name], NP  Active   gabapentin (NEURONTIN) 300 MG capsule [id number]  Take 1 capsule (300 mg total) by mouth 4 (four) times daily. [name], NP  Active Self  glipiZIDE (GLUCOTROL) 10 MG tablet [id number] Yes TAKE 1 TABLET BY MOUTH TWICE DAILY BEFORE A MEAL  Patient taking differently: Take 5 mg by mouth 2 (two) times daily before a meal. TAKE 1 TABLET BY MOUTH TWICE DAILY BEFORE A MEAL   [name], [name], NP Taking Active Self  insulin glargine (LANTUS SOLOSTAR) 100 UNIT/ML Solostar Pen [id number] Yes Inject 35 Units  into the skin 2 (two) times daily. [name], NP Taking Active Self  lisinopril (ZESTRIL) 5 MG tablet [id number]  Take 1 tablet (5 mg total) by mouth daily. [name], NP  Active Self  meclizine (ANTIVERT) 25 MG tablet [id number]  TAKE 1 TABLET THREE TIMES A DAY AS NEEDED FOR DIZZINESS [name], [name], NP  Active   Multiple Vitamins-Minerals (CENTRUM SILVER 50+WOMEN PO) [id number]  Take 1 tablet by mouth daily. [provider]  Active Self  Omega-3 Fatty Acids (FISH OIL) 1000 MG CAPS [id number]  Take 1 capsule by mouth in the morning and at bedtime. [provider]  Active Self  omeprazole (PRILOSEC) 20 MG capsule [id number]  Take 1 capsule (20 mg total) by mouth daily. [name], NP  Active Self  Semaglutide, 1 MG/DOSE, (OZEMPIC, 1 MG/DOSE,) 4 MG/3ML SOPN [id number] Yes Inject 1 mg into the skin once a week. [provider] Taking Active   simvastatin (ZOCOR) 20 MG tablet [id number]  TAKE 1 TABLET BY MOUTH ONCE DAILY AT  [name], NP  Active Self            Pertinent Labs:  Lab Results  Component Value Date   HGBA1C 7.6 (A) 12/13/2022   Lab Results  Component Value Date   CHOL 129 12/13/2022   HDL 36 (L) 12/13/2022   LDLCALC 64 12/13/2022   TRIG 233 (H) 12/13/2022   CHOLHDL  3.6 12/13/2022   Lab Results  Component Value Date   CREATININE 0.65 12/13/2022   BUN 8 12/13/2022   NA 140 12/13/2022   K 4.9 12/13/2022   CL 103 12/13/2022   CO2 26 12/13/2022   BP Readings from Last 3 Encounters:  12/13/22 106/62  11/08/22 116/68  10/31/22 114/76   Pulse Readings from Last 3 Encounters:  12/13/22 80  11/08/22 66  10/31/22 72     SDOH:  (Social Determinants of Health) assessments and interventions performed:  SDOH Interventions    Flowsheet Row Office Visit from 12/13/2022 in [facility] Chronic Care Management from 10/07/2022 in [facility] Office Visit from 09/06/2022 in  [facility] Office Visit from 11/21/2021 in [facility] Office Visit from 08/21/2021 in [facility]  SDOH Interventions       Food Insecurity Interventions -- Intervention Not Indicated -- -- --  Housing Interventions -- Intervention Not Indicated -- -- --  Transportation Interventions -- Intervention Not Indicated -- -- --  Utilities Interventions -- Intervention Not Indicated -- -- --  Alcohol Usage Interventions -- Intervention Not Indicated (Score <7) -- -- --  Depression Interventions/Treatment  Currently on Treatment -- Currently on Treatment Currently on Treatment Medication  Financial Strain Interventions -- Other (Comment)  [cost constraints with obtaining medications- pharm referral in place] -- -- --  Physical Activity Interventions -- Intervention Not Indicated, Other (Comments)  [no structured activity, does work in her home and out in her yard] -- -- --  Stress Interventions -- Other (Comment)  [worries alot, education and support given, resources given about LCSW available if needed] -- -- --  Social Connections Interventions -- Other (Comment)  [has good support from her family] -- -- --       [place]  Review of patient past medical history, allergies, medications, health status, including review of consultants reports, laboratory and other test data, was performed as part of comprehensive evaluation and provision of chronic care management services.   Care Plan : General Pharmacy (Adult)  Updates made by Rennis Petty, RPH-CPP since 01/10/2023 12:00 AM     Problem: Disease Progression      Long-Range Goal: Disease Progression Prevented or Minimized   Start Date: 10/07/2022  Expected End Date: 01/05/2023  Recent Progress: On track  Priority: High  Note:   Current Barriers:  Unable to independently afford treatment regimen Reports cost of Mounjaro unaffordable as not covered  through her [place] Medicare plan Patient enrolled in [place] patient assistance program from [employer] through 11/04/2023 Unable to achieve control of T2DM   Pharmacist Clinical Goal(s):  patient will verbalize ability to afford treatment regimen achieve control of T2DM as evidenced by A1C  through collaboration with PharmD and provider.    Interventions: 1:1 collaboration with [name], NP regarding development and update of comprehensive plan of care as evidenced by provider attestation and co-signature Inter-disciplinary care team collaboration (see longitudinal plan of care)  Diabetes:  Uncontrolled; current treatment: glipizide 10 mg - 1/2 tablet (5 mg) twice daily 30 minutes before breakfast and supper Lantus 35 units twice daily (reports self-increased back to this dose for blood sugar control) Ozempic 1 mg weekly (reports taking using 2 injections x 0.5 mg to either side of abdomen) on Mondays (reports increased on 2/12 per discussion with PCP) Reports tolerating well Previous therapies tried: metformin (GI  side effects) Recalls current glucose readings ranging:  Morning fasting: 85-100 After supper: 170-200 Denies recent hypoglycemia Statin: simvastatin 20 mg daily Encourage patient to have regular well-balanced meals and snacks spread throughout the day, while controlling carbohydrate portion sizes Patient noticed improvement in her appetite control/ability to limit portion sizes since started Ozempic Exercise: reports recently limited, but plans to moving more outdoors as weather improving Counsel patient to continue to monitor home blood sugar, keep log of results, have this record to review during upcoming appointments and to contact office sooner if needed for reading outside of established parameters or for symptoms such as any episodes of hypoglycemia Collaborate with PCP regarding recent Ozempic dose increase/need for updated Rx to assistance program if patient  to continue on increased dose of Ozempic 1 mg weekly Provider confirms will send new Rx to [employer] using reorder/change form as provided  Tobacco Use: Reports has started smoking again Currently smoking ~1 pack/day Triggers: stress (health/family member health) Strategies: spending time outdoors to help with stress relief Motivation: does not want any further of risk if upcoming lung cancer screening is negative Reports has decided once she receives lung cancer screening results, if result is clear, plans to set quit date Support: reports daughter provided great support in helping her quit before Previous therapies tried: nicotine patches (skin irritation) Denies interest in using medications/nicotine replacement to help with cessation at this time   Patient Goals/Self-Care Activities patient will:  - check glucose, document, and provide at future appointments - check blood pressure, document, and provide at future appointments - collaborate with provider on medication access solutions - engage in dietary modifications by having regular well-balanced meals and snacks spread throughout the day, while controlling carbohydrate portion sizes       Plan:   Will follow up with [facility] regarding processing of Ozempic 1 mg weekly dose within the next 7 days  Telephone follow up appointment with care management team member scheduled for:  02/10/2023 at 11:30 am  Wallace Cullens, PharmD, [place] [phone]

## 2023-01-10 NOTE — Patient Instructions (Addendum)
Visit Information  Thank you for taking time to visit with me today. Please don't hesitate to contact me if I can be of assistance to you before our next scheduled telephone appointment.  Following are the goals we discussed today:   Goals Addressed             This Visit's Progress    Pharmacy Goals       Our goal A1c is less than 7%. This corresponds with fasting sugars less than 130 and 2 hour after meal sugars less than 180. Please keep a log of your results when checking your blood sugar   Our goal bad cholesterol, or LDL, is less than 70 . This is why it is important to continue taking your simvastatin.  Please have your medications and blood sugar log with you for our next telephone call.  Wallace Cullens, PharmD, [place] [phone]          Our next appointment is by telephone on 02/10/2023 at 11:30 am  Please call the care guide team at [phone] if you need to cancel or reschedule your appointment.   Patient verbalizes understanding of instructions and care plan provided today and agrees to view in [place]. Active MyChart status and patient understanding of how to access instructions and care plan via MyChart confirmed with patient.

## 2023-01-13 ENCOUNTER — Ambulatory Visit: Payer: Medicare Other | Admitting: Pharmacist

## 2023-01-13 DIAGNOSIS — E1165 Type 2 diabetes mellitus with hyperglycemia: Secondary | ICD-10-CM

## 2023-01-13 NOTE — Chronic Care Management (AMB) (Unsigned)
Chronic Care Management CCM Pharmacy Note  01/13/2023 Name:  Brittany Pruitt MRN:  [id number] DOB:  08-Apr-1961   Subjective: Brittany Pruitt is an 62 y.o. year old female who is a primary patient of [name], NP.  The CCM team was consulted for assistance with disease management and care coordination needs.    Collaboration with [employer]  on behalf of patient.  Was unable to reach patient via telephone today and have left HIPAA compliant voicemail asking patient to return my call.    Objective:  Medications Reviewed Today     Reviewed by Rennis Petty, RPH-CPP (Pharmacist) on 01/10/23 at 1515  Med List Status: <None>   Medication Order Taking? Sig Documenting Provider Last Dose Status Informant  acetaminophen (TYLENOL) 500 MG tablet [id number]  Take 500 mg by mouth every 6 (six) hours as needed for moderate pain. [provider]  Active Self  albuterol (VENTOLIN HFA) 108 (90 Base) MCG/ACT inhaler [id number]  Inhale 2 puffs into the lungs every 6 (six) hours as needed for wheezing or shortness of breath. [place] [name], DO  Active   busPIRone (BUSPAR) 5 MG tablet [id number]  Take 1 tablet (5 mg total) by mouth 2 (two) times daily. [name], NP  Active Self  citalopram (CELEXA) 20 MG tablet [id number]  Take 1 tablet (20 mg total) by mouth daily. [name], NP  Active Self  clonazePAM (KLONOPIN) 0.5 MG tablet [id number]  TAKE ONE-HALF (1/2) TO ONE TABLET TWICE A DAY AS NEEDED FOR ANXIETY [name], [name], NP  Active   gabapentin (NEURONTIN) 300 MG capsule [id number]  Take 1 capsule (300 mg total) by mouth 4 (four) times daily. [name], NP  Active Self  glipiZIDE (GLUCOTROL) 10 MG tablet [id number] Yes TAKE 1 TABLET BY MOUTH TWICE DAILY BEFORE A MEAL  Patient taking differently: Take 5 mg by mouth 2 (two) times daily before a meal. TAKE 1 TABLET BY MOUTH TWICE DAILY BEFORE A MEAL   [name], [name], NP Taking Active Self  insulin glargine (LANTUS  SOLOSTAR) 100 UNIT/ML Solostar Pen [id number] Yes Inject 35 Units into the skin 2 (two) times daily. [name], NP Taking Active Self  lisinopril (ZESTRIL) 5 MG tablet [id number]  Take 1 tablet (5 mg total) by mouth daily. [name], NP  Active Self  meclizine (ANTIVERT) 25 MG tablet [id number]  TAKE 1 TABLET THREE TIMES A DAY AS NEEDED FOR DIZZINESS [name], [name], NP  Active   Multiple Vitamins-Minerals (CENTRUM SILVER 50+WOMEN PO) [id number]  Take 1 tablet by mouth daily. [provider]  Active Self  Omega-3 Fatty Acids (FISH OIL) 1000 MG CAPS [id number]  Take 1 capsule by mouth in the morning and at bedtime. [provider]  Active Self  omeprazole (PRILOSEC) 20 MG capsule [id number]  Take 1 capsule (20 mg total) by mouth daily. [name], NP  Active Self  Semaglutide, 1 MG/DOSE, (OZEMPIC, 1 MG/DOSE,) 4 MG/3ML SOPN [id number] Yes Inject 1 mg into the skin once a week. [provider] Taking Active   simvastatin (ZOCOR) 20 MG tablet [id number]  TAKE 1 TABLET BY MOUTH ONCE DAILY AT  [name], NP  Active Self            Pertinent Labs:   Lab Results  Component Value Date   HGBA1C 7.6 (A) 12/13/2022   Lab Results  Component Value Date   CREATININE 0.65 12/13/2022   BUN 8  12/13/2022   NA 140 12/13/2022   K 4.9 12/13/2022   CL 103 12/13/2022   CO2 26 12/13/2022    SDOH:  (Social Determinants of Health) assessments and interventions performed:  SDOH Interventions    Flowsheet Row Office Visit from 12/13/2022 in [facility] Chronic Care Management from 10/07/2022 in [facility] Office Visit from 09/06/2022 in [facility] Office Visit from 11/21/2021 in [facility] Office Visit from 08/21/2021 in [facility]  SDOH Interventions       Food Insecurity Interventions -- Intervention Not Indicated -- -- --   Housing Interventions -- Intervention Not Indicated -- -- --  Transportation Interventions -- Intervention Not Indicated -- -- --  Utilities Interventions -- Intervention Not Indicated -- -- --  Alcohol Usage Interventions -- Intervention Not Indicated (Score <7) -- -- --  Depression Interventions/Treatment  Currently on Treatment -- Currently on Treatment Currently on Treatment Medication  Financial Strain Interventions -- Other (Comment)  [cost constraints with obtaining medications- pharm referral in place] -- -- --  Physical Activity Interventions -- Intervention Not Indicated, Other (Comments)  [no structured activity, does work in her home and out in her yard] -- -- --  Stress Interventions -- Other (Comment)  [worries alot, education and support given, resources given about LCSW available if needed] -- -- --  Social Connections Interventions -- Other (Comment)  [has good support from her family] -- -- --       [place]  Review of patient past medical history, allergies, medications, health status, including review of consultants reports, laboratory and other test data, was performed as part of comprehensive evaluation and provision of chronic care management services.   Care Plan : General Pharmacy (Adult)  Updates made by Rennis Petty, RPH-CPP since 01/13/2023 12:00 AM     Problem: Disease Progression      Long-Range Goal: Disease Progression Prevented or Minimized   Start Date: 10/07/2022  Expected End Date: 01/05/2023  Recent Progress: On track  Priority: High  Note:   Current Barriers:  Unable to independently afford treatment regimen Reports cost of Mounjaro unaffordable as not covered through her [place] Medicare plan Patient enrolled in [place] patient assistance program from [employer] through 11/04/2023 Unable to achieve control of T2DM   Pharmacist Clinical Goal(s):  patient will verbalize ability to afford treatment regimen achieve control of T2DM as  evidenced by A1C  through collaboration with PharmD and provider.    Interventions: 1:1 collaboration with [name], NP regarding development and update of comprehensive plan of care as evidenced by provider attestation and co-signature Inter-disciplinary care team collaboration (see longitudinal plan of care) Followed up with [employer] today on behalf of patient. Speak with representative Ashlynn who confirms Ozempic 1 mg order from PCP received [employer] will start processing Ozempic 1 mg weekly Rx for patient today and expected to ship to office within 10-14 business days Attempted to reach patient by telephone to provide update. Left HIPAA compliant voicemail asking patient to return my call   Patient Goals/Self-Care Activities patient will:  - check glucose, document, and provide at future appointments - check blood pressure, document, and provide at future appointments - collaborate with provider on medication access solutions - engage in dietary modifications by having regular well-balanced meals and snacks spread throughout the day, while controlling carbohydrate portion sizes  Plan: Telephone follow up appointment with care management team member scheduled for:  02/10/2023 11:30 AM  Wallace Cullens, PharmD, [place] [phone]

## 2023-01-14 ENCOUNTER — Ambulatory Visit
Admission: RE | Admit: 2023-01-14 | Discharge: 2023-01-14 | Disposition: A | Discharge status: Home / Self Care | Payer: Medicare Other | Source: Ambulatory Visit | Attending: Acute Care | Admitting: Acute Care

## 2023-01-14 DIAGNOSIS — F17210 Nicotine dependence, cigarettes, uncomplicated: Secondary | ICD-10-CM | POA: Diagnosis not present

## 2023-01-14 DIAGNOSIS — Z87891 Personal history of nicotine dependence: Secondary | ICD-10-CM | POA: Insufficient documentation

## 2023-01-14 DIAGNOSIS — Z122 Encounter for screening for malignant neoplasm of respiratory organs: Secondary | ICD-10-CM | POA: Diagnosis not present

## 2023-01-15 ENCOUNTER — Other Ambulatory Visit: Payer: Self-pay | Admitting: Acute Care

## 2023-01-15 DIAGNOSIS — Z122 Encounter for screening for malignant neoplasm of respiratory organs: Secondary | ICD-10-CM

## 2023-01-15 DIAGNOSIS — F17210 Nicotine dependence, cigarettes, uncomplicated: Secondary | ICD-10-CM

## 2023-01-15 DIAGNOSIS — Z87891 Personal history of nicotine dependence: Secondary | ICD-10-CM

## 2023-01-16 NOTE — Patient Instructions (Signed)
Goals Addressed             This Visit's Progress    Pharmacy Goals       Our goal A1c is less than 7%. This corresponds with fasting sugars less than 130 and 2 hour after meal sugars less than 180. Please keep a log of your results when checking your blood sugar   Our goal bad cholesterol, or LDL, is less than 70 . This is why it is important to continue taking your simvastatin.  Please have your medications and blood sugar log with you for our next telephone call.   Wallace Cullens, PharmD, [place] [phone]

## 2023-01-20 ENCOUNTER — Ambulatory Visit: Payer: Self-pay

## 2023-01-20 ENCOUNTER — Telehealth: Payer: Medicare Other

## 2023-01-20 DIAGNOSIS — I10 Essential (primary) hypertension: Secondary | ICD-10-CM

## 2023-01-20 DIAGNOSIS — E1165 Type 2 diabetes mellitus with hyperglycemia: Secondary | ICD-10-CM

## 2023-01-20 NOTE — Patient Instructions (Signed)
Please call the care guide team at [phone] if you need to cancel or reschedule your appointment.   If you are experiencing a Mental Health or [place] or need someone to talk to, please call the Suicide and Crisis Lifeline: 988 call the [place] National Suicide Prevention Lifeline: 562-198-7987 or TTY: 402-184-0928 TTY 2088877149) to talk to a trained counselor call 1-800-273-TALK (toll free, 24 hour hotline)   Following is a copy of the CCM Program Consent:  CCM service includes personalized support from designated clinical staff supervised by the physician, including individualized plan of care and coordination with other care providers 24/7 contact phone numbers for assistance for urgent and routine care needs. Service will only be billed when office clinical staff spend 20 minutes or more in a month to coordinate care. Only one practitioner may furnish and bill the service in a calendar month. The patient may stop CCM services at amy time (effective at the end of the month) by phone call to the office staff. The patient will be responsible for cost sharing (co-pay) or up to 20% of the service fee (after annual deductible is met)  Following is a copy of your full provider care plan:   Goals Addressed             This Visit's Progress    CCM Expected Outcome:  Monitor, Self-Manage and Reduce Symptoms of Diabetes       Current Barriers:  Knowledge Deficits related to the importance of blood sugars in range to prevent complications from DM Care Coordination needs related to cost constraints of Mounjaro and her insurance not covering Mounjaro in a patient with DM Chronic Disease Management support and education needs related to effective management of DM Financial Constraints.  Last eye exam > 1 year ago in July  Lab Results  Component Value Date   HGBA1C 7.6 (A) 12/13/2022  Previously 8.8 in November.   Planned Interventions: Provided education to patient  about basic DM disease process. Review of DM and the patient has had some fluctuations in A1C over the last several years. Review of goal of A1C of <7.0. A1C trending down. Education provided.  Reviewed medications with patient and discussed importance of medication adherence. The patient works with the pharm D for medication adherence and education. The patient states she has been approved for assistance to get Ozempic. The patient just received her letter. The patient works with the [place]. The patient should receive her Ozempic at the office in the next week. She has been approved through 2024.  Reviewed prescribed diet with patient heart healthy/ADA diet. Education given. The patient states her biggest meal of the day is Supper. She has been eating a lot of salads but states that her sugars are a little high after eating salads. Review of dressing and this may be the reason. Discussed bedtime snacks. The patient does not eat a bedtime snack. Education provided that she may want to start eating a bedtime snack as this may help with regulation of her blood sugars. Will attach information to the AVS; Counseled on importance of regular laboratory monitoring as prescribed. Labs are up to date. The patient states that she is happy her A1c is trending down. Review of goals    Discussed plans with patient for ongoing care management follow up and provided patient with direct contact information for care management team;      Provided patient with written educational materials related to hypo and hyperglycemia and importance of  correct treatment. Review and education.       Advised patient, providing education and rationale, to check cbg twice daily and when you have symptoms of low or high blood sugar and record. The patient states her blood sugar range in am is 85 to 100, before meals 130 to 160 and after meals 160 to 185. Education on the goal of fasting <130 or less and the goal of post prandial of <180.  The  patient is doing better since being back on Ozempic and mindful of what she is eating.       call provider for findings outside established parameters;       Referral made to pharmacy team for assistance with cost constraints and side effects of medications. Is working with the pharm D and has been approved for Ozempic   Review of patient status, including review of consultants reports, relevant laboratory and other test results, and medications completed;       Advised patient to discuss changes in her DM health and well being with provider;      Screening for signs and symptoms of depression related to chronic disease state;        Assessed social determinant of health barriers;   The patient states she is overdue for eye exam. Plans to call and get one scheduled in the next couple of months. Encouraged the patient to have yearly exams. Does check her feet daily for scraps, scratches, cuts, sores. Review of keeping feet clean and dry.       The patient states she needs to make an appointment to get her eyes checked. Education and support given.   Symptom Management: Take medications as prescribed   Attend all scheduled provider appointments Call provider office for new concerns or questions  call the Suicide and Crisis Lifeline: 988 call the [place] National Suicide Prevention Lifeline: 848-002-3282 or TTY: 435-611-8571 TTY (707)583-5027) to talk to a trained counselor call 1-800-273-TALK (toll free, 24 hour hotline) if experiencing a Mental Health or [place]  schedule appointment with eye doctor check feet daily for cuts, sores or redness trim toenails straight across manage portion size wash and dry feet carefully every day wear comfortable, cotton socks wear comfortable, well-fitting shoes  Follow Up Plan: Telephone follow up appointment with care management team member scheduled for: 03-17-2023 at 0900 am       CCM Expected Outcome:  Monitor, Self-Manage, and Reduce  Symptoms of Hypertension       Current Barriers:  Knowledge Deficits related to the need to have normalized blood pressures to prevent the risk of heart attack or stroke Chronic Disease Management support and education needs related to effective management of HTN BP Readings from Last 3 Encounters:  12/13/22 106/62  11/08/22 116/68  10/31/22 114/76     Planned Interventions: Evaluation of current treatment plan related to hypertension self management and patient's adherence to plan as established by provider. The patient states her blood pressures are stable. Does not take at home. States that this is usually good for her. Knows when to call the provider for changes. Will continue to monitor.;   Provided education to patient re: stroke prevention, s/s of heart attack and stroke; Reviewed prescribed diet heart healthy/ADA diet. Education and review. She usually eats her main meal at supper time. States that she eats a lot of salads recently. She denies any issues with heart healthy/ADA diet. Reviewed medications with patient and discussed importance of compliance. Is compliant with medications.  Works with the [place] D on a regular basis. Denies any issues with medication compliance;  Counseled on the importance of exercise goals with target of 150 minutes per week. The patient is mowing her yard and actually doing well. She paces her activity. Denies any new issues with activity intolerance  Discussed plans with patient for ongoing care management follow up and provided patient with direct contact information for care management team; Advised patient, providing education and rationale, to monitor blood pressure daily and record, calling PCP for findings outside established parameters. The patient states she checks her blood pressures when needed. States they are WNL. Denies any acute findings related to blood pressures ;  Reviewed scheduled/upcoming provider appointments including: Saw pcp 12-13-2022.  No changes in the plan of care. Knows to call for new concerns or needs Advised patient to discuss changes in her HTN and heart health  with provider; Provided education on prescribed diet heart healthy/ADA. Education and support given. Review of healthy eating options ;  Discussed complications of poorly controlled blood pressure such as heart disease, stroke, circulatory complications, vision complications, kidney impairment, sexual dysfunction;  Screening for signs and symptoms of depression related to chronic disease state;  Assessed social determinant of health barriers;  Review of respiratory conditions. The patient denies any exacerbations with her breathing. States that she feels fine and saw the pulmonary provider in February.   Symptom Management: Take medications as prescribed   Attend all scheduled provider appointments Call provider office for new concerns or questions  call the Suicide and Crisis Lifeline: 988 call the [place] National Suicide Prevention Lifeline: 856 514 3697 or TTY: 8544024304 TTY 810-137-6114) to talk to a trained counselor call 1-800-273-TALK (toll free, 24 hour hotline) if experiencing a Mental Health or [place]  check blood pressure weekly learn about high blood pressure keep a blood pressure log take blood pressure log to all doctor appointments call doctor for signs and symptoms of high blood pressure develop an action plan for high blood pressure keep all doctor appointments take medications for blood pressure exactly as prescribed report new symptoms to your doctor  Follow Up Plan: Telephone follow up appointment with care management team member scheduled for: 03-17-2023 at 0900 am          Patient verbalizes understanding of instructions and care plan provided today and agrees to view in [place]. Active MyChart status and patient understanding of how to access instructions and care plan via MyChart confirmed with patient.      Telephone follow up appointment with care management team member scheduled for: 03-17-2023 at 0900 am

## 2023-01-20 NOTE — Chronic Care Management (AMB) (Signed)
Chronic Care Management   CCM RN Visit Note  01/20/2023 Name: Nanita Kedrowski MRN: [id number] DOB: 02/12/1961  Subjective: Brittany Pruitt is a 62 y.o. year old female who is a primary care patient of [name], NP. The patient was referred to the Chronic Care Management team for assistance with care management needs subsequent to provider initiation of CCM services and plan of care.    Today's Visit:  Engaged with patient by telephone for follow up visit.        Goals Addressed             This Visit's Progress    CCM Expected Outcome:  Monitor, Self-Manage and Reduce Symptoms of Diabetes       Current Barriers:  Knowledge Deficits related to the importance of blood sugars in range to prevent complications from DM Care Coordination needs related to cost constraints of Mounjaro and her insurance not covering Mounjaro in a patient with DM Chronic Disease Management support and education needs related to effective management of DM Financial Constraints.  Last eye exam > 1 year ago in July  Lab Results  Component Value Date   HGBA1C 7.6 (A) 12/13/2022  Previously 8.8 in November.   Planned Interventions: Provided education to patient about basic DM disease process. Review of DM and the patient has had some fluctuations in A1C over the last several years. Review of goal of A1C of <7.0. A1C trending down. Education provided.  Reviewed medications with patient and discussed importance of medication adherence. The patient works with the pharm D for medication adherence and education. The patient states she has been approved for assistance to get Ozempic. The patient just received her letter. The patient works with the [place]. The patient should receive her Ozempic at the office in the next week. She has been approved through 2024.  Reviewed prescribed diet with patient heart healthy/ADA diet. Education given. The patient states her biggest meal of the day is Supper. She has been  eating a lot of salads but states that her sugars are a little high after eating salads. Review of dressing and this may be the reason. Discussed bedtime snacks. The patient does not eat a bedtime snack. Education provided that she may want to start eating a bedtime snack as this may help with regulation of her blood sugars. Will attach information to the AVS; Counseled on importance of regular laboratory monitoring as prescribed. Labs are up to date. The patient states that she is happy her A1c is trending down. Review of goals    Discussed plans with patient for ongoing care management follow up and provided patient with direct contact information for care management team;      Provided patient with written educational materials related to hypo and hyperglycemia and importance of correct treatment. Review and education.       Advised patient, providing education and rationale, to check cbg twice daily and when you have symptoms of low or high blood sugar and record. The patient states her blood sugar range in am is 85 to 100, before meals 130 to 160 and after meals 160 to 185. Education on the goal of fasting <130 or less and the goal of post prandial of <180.  The patient is doing better since being back on Ozempic and mindful of what she is eating.       call provider for findings outside established parameters;       Referral made to pharmacy team for assistance  with cost constraints and side effects of medications. Is working with the pharm D and has been approved for Ozempic   Review of patient status, including review of consultants reports, relevant laboratory and other test results, and medications completed;       Advised patient to discuss changes in her DM health and well being with provider;      Screening for signs and symptoms of depression related to chronic disease state;        Assessed social determinant of health barriers;   The patient states she is overdue for eye exam. Plans to  call and get one scheduled in the next couple of months. Encouraged the patient to have yearly exams. Does check her feet daily for scraps, scratches, cuts, sores. Review of keeping feet clean and dry.       The patient states she needs to make an appointment to get her eyes checked. Education and support given.   Symptom Management: Take medications as prescribed   Attend all scheduled provider appointments Call provider office for new concerns or questions  call the Suicide and Crisis Lifeline: 988 call the [place] National Suicide Prevention Lifeline: (312)121-9420 or TTY: 626-486-3966 TTY 2502380106) to talk to a trained counselor call 1-800-273-TALK (toll free, 24 hour hotline) if experiencing a Mental Health or [place]  schedule appointment with eye doctor check feet daily for cuts, sores or redness trim toenails straight across manage portion size wash and dry feet carefully every day wear comfortable, cotton socks wear comfortable, well-fitting shoes  Follow Up Plan: Telephone follow up appointment with care management team member scheduled for: 03-17-2023 at 0900 am       CCM Expected Outcome:  Monitor, Self-Manage, and Reduce Symptoms of Hypertension       Current Barriers:  Knowledge Deficits related to the need to have normalized blood pressures to prevent the risk of heart attack or stroke Chronic Disease Management support and education needs related to effective management of HTN BP Readings from Last 3 Encounters:  12/13/22 106/62  11/08/22 116/68  10/31/22 114/76     Planned Interventions: Evaluation of current treatment plan related to hypertension self management and patient's adherence to plan as established by provider. The patient states her blood pressures are stable. Does not take at home. States that this is usually good for her. Knows when to call the provider for changes. Will continue to monitor.;   Provided education to patient re:  stroke prevention, s/s of heart attack and stroke; Reviewed prescribed diet heart healthy/ADA diet. Education and review. She usually eats her main meal at supper time. States that she eats a lot of salads recently. She denies any issues with heart healthy/ADA diet. Reviewed medications with patient and discussed importance of compliance. Is compliant with medications. Works with the [place] D on a regular basis. Denies any issues with medication compliance;  Counseled on the importance of exercise goals with target of 150 minutes per week. The patient is mowing her yard and actually doing well. She paces her activity. Denies any new issues with activity intolerance  Discussed plans with patient for ongoing care management follow up and provided patient with direct contact information for care management team; Advised patient, providing education and rationale, to monitor blood pressure daily and record, calling PCP for findings outside established parameters. The patient states she checks her blood pressures when needed. States they are WNL. Denies any acute findings related to blood pressures ;  Reviewed scheduled/upcoming provider  appointments including: Saw pcp 12-13-2022. No changes in the plan of care. Knows to call for new concerns or needs Advised patient to discuss changes in her HTN and heart health  with provider; Provided education on prescribed diet heart healthy/ADA. Education and support given. Review of healthy eating options ;  Discussed complications of poorly controlled blood pressure such as heart disease, stroke, circulatory complications, vision complications, kidney impairment, sexual dysfunction;  Screening for signs and symptoms of depression related to chronic disease state;  Assessed social determinant of health barriers;  Review of respiratory conditions. The patient denies any exacerbations with her breathing. States that she feels fine and saw the pulmonary provider in February.    Symptom Management: Take medications as prescribed   Attend all scheduled provider appointments Call provider office for new concerns or questions  call the Suicide and Crisis Lifeline: 988 call the [place] National Suicide Prevention Lifeline: (903)597-5895 or TTY: 929-167-9653 TTY 206-702-5874) to talk to a trained counselor call 1-800-273-TALK (toll free, 24 hour hotline) if experiencing a Mental Health or [place]  check blood pressure weekly learn about high blood pressure keep a blood pressure log take blood pressure log to all doctor appointments call doctor for signs and symptoms of high blood pressure develop an action plan for high blood pressure keep all doctor appointments take medications for blood pressure exactly as prescribed report new symptoms to your doctor  Follow Up Plan: Telephone follow up appointment with care management team member scheduled for: 03-17-2023 at 0900 am          Plan:Telephone follow up appointment with care management team member scheduled for:  03-17-2023 at 0900 am  [place], MSN, CCM RN Care Manager  Chronic Care Management Direct Number: [phone]

## 2023-01-23 ENCOUNTER — Telehealth: Payer: Self-pay

## 2023-01-23 NOTE — Telephone Encounter (Signed)
I called and spoke with the patient and she is aware that her samples of Ozempic have arrived at the office and she can come pick it up.

## 2023-01-30 NOTE — Telephone Encounter (Signed)
I received a letter from [employer] for [employer] with an attachment of provider pages reference about pt is enrolled in auto-refill or change in dose it is an provider pg with a later date than the one that is in media that she was approved I will scan in media of chart for records  Thanks.  [facility] Patient Advocate [phone]

## 2023-02-02 DIAGNOSIS — F17210 Nicotine dependence, cigarettes, uncomplicated: Secondary | ICD-10-CM | POA: Diagnosis not present

## 2023-02-02 DIAGNOSIS — E1159 Type 2 diabetes mellitus with other circulatory complications: Secondary | ICD-10-CM | POA: Diagnosis not present

## 2023-02-02 DIAGNOSIS — I10 Essential (primary) hypertension: Secondary | ICD-10-CM | POA: Diagnosis not present

## 2023-02-02 DIAGNOSIS — Z794 Long term (current) use of insulin: Secondary | ICD-10-CM

## 2023-02-10 ENCOUNTER — Ambulatory Visit (INDEPENDENT_AMBULATORY_CARE_PROVIDER_SITE_OTHER): Payer: Medicare Other | Admitting: Pharmacist

## 2023-02-10 DIAGNOSIS — E1165 Type 2 diabetes mellitus with hyperglycemia: Secondary | ICD-10-CM

## 2023-02-10 DIAGNOSIS — E1169 Type 2 diabetes mellitus with other specified complication: Secondary | ICD-10-CM

## 2023-02-10 NOTE — Chronic Care Management (AMB) (Signed)
Chronic Care Management CCM Pharmacy Note  02/10/2023 Name:  Brittany Pruitt MRN:  [id number] DOB:  11-07-1960   Subjective: Brittany Pruitt is an 62 y.o. year old female who is a primary patient of Brittany Munroe, NP.  The CCM team was consulted for assistance with disease management and care coordination needs.    Engaged with patient by telephone for follow up visit for pharmacy case management and/or care coordination services.   Objective:  Medications Reviewed Today     Reviewed by Marlowe Sax, RN (Case Manager) on 01/20/23 at [phone]  Med List Status: <None>   Medication Order Taking? Sig Documenting Provider Last Dose Status Informant  acetaminophen (TYLENOL) 500 MG tablet [id number] No Take 500 mg by mouth every 6 (six) hours as needed for moderate pain. [provider] Taking Active Self  albuterol (VENTOLIN HFA) 108 (90 Base) MCG/ACT inhaler [id number] No Inhale 2 puffs into the lungs every 6 (six) hours as needed for wheezing or shortness of breath. [place], Brittany Philips, DO Taking Active   busPIRone (BUSPAR) 5 MG tablet [id number] No Take 1 tablet (5 mg total) by mouth 2 (two) times daily. Brittany Munroe, NP Taking Active Self  citalopram (CELEXA) 20 MG tablet [id number] No Take 1 tablet (20 mg total) by mouth daily. Brittany Munroe, NP Taking Active Self  clonazePAM (KLONOPIN) 0.5 MG tablet [id number]  TAKE ONE-HALF (1/2) TO ONE TABLET TWICE A DAY AS NEEDED FOR ANXIETY [name], Salvadore Oxford, NP  Active   gabapentin (NEURONTIN) 300 MG capsule [id number] No Take 1 capsule (300 mg total) by mouth 4 (four) times daily. Brittany Munroe, NP Taking Active Self  glipiZIDE (GLUCOTROL) 10 MG tablet [id number] No TAKE 1 TABLET BY MOUTH TWICE DAILY BEFORE A MEAL  Patient taking differently: Take 5 mg by mouth 2 (two) times daily before a meal. TAKE 1 TABLET BY MOUTH TWICE DAILY BEFORE A MEAL   [name], Salvadore Oxford, NP Taking Active Self  insulin glargine (LANTUS SOLOSTAR) 100 UNIT/ML Solostar Pen  [id number] No Inject 35 Units into the skin 2 (two) times daily. Brittany Munroe, NP Taking Active Self  lisinopril (ZESTRIL) 5 MG tablet [id number] No Take 1 tablet (5 mg total) by mouth daily. Brittany Munroe, NP Taking Active Self  meclizine (ANTIVERT) 25 MG tablet [id number]  TAKE 1 TABLET THREE TIMES A DAY AS NEEDED FOR DIZZINESS [name], Salvadore Oxford, NP  Active   Multiple Vitamins-Minerals (CENTRUM SILVER 50+WOMEN PO) [id number] No Take 1 tablet by mouth daily. [provider] Taking Active Self  Omega-3 Fatty Acids (FISH OIL) 1000 MG CAPS [id number] No Take 1 capsule by mouth in the morning and at bedtime. [provider] Taking Active Self  omeprazole (PRILOSEC) 20 MG capsule [id number] No Take 1 capsule (20 mg total) by mouth daily. Brittany Munroe, NP Taking Active Self  Semaglutide, 1 MG/DOSE, (OZEMPIC, 1 MG/DOSE,) 4 MG/3ML SOPN [id number] No Inject 1 mg into the skin once a week. [provider] Taking Active   simvastatin (ZOCOR) 20 MG tablet [id number] No TAKE 1 TABLET BY MOUTH ONCE DAILY AT  Brittany Gleason, NP Taking Active Self            Pertinent Labs:  Lab Results  Component Value Date   HGBA1C 7.6 (A) 12/13/2022   Lab Results  Component Value Date   CHOL 129 12/13/2022   HDL 36 (L) 12/13/2022   LDLCALC 64 12/13/2022   TRIG 233 (H) 12/13/2022  CHOLHDL 3.6 12/13/2022   Lab Results  Component Value Date   CREATININE 0.65 12/13/2022   BUN 8 12/13/2022   NA 140 12/13/2022   K 4.9 12/13/2022   CL 103 12/13/2022   CO2 26 12/13/2022     SDOH:  (Social Determinants of Health) assessments and interventions performed:  SDOH Interventions    Flowsheet Row Office Visit from 12/13/2022 in [facility] Chronic Care Management from 10/07/2022 in [facility] Office Visit from 09/06/2022 in [facility] Office Visit from 11/21/2021 in [facility]  Office Visit from 08/21/2021 in [facility]  SDOH Interventions       Food Insecurity Interventions -- Intervention Not Indicated -- -- --  Housing Interventions -- Intervention Not Indicated -- -- --  Transportation Interventions -- Intervention Not Indicated -- -- --  Utilities Interventions -- Intervention Not Indicated -- -- --  Alcohol Usage Interventions -- Intervention Not Indicated (Score <7) -- -- --  Depression Interventions/Treatment  Currently on Treatment -- Currently on Treatment Currently on Treatment Medication  Financial Strain Interventions -- Other (Comment)  [cost constraints with obtaining medications- pharm referral in place] -- -- --  Physical Activity Interventions -- Intervention Not Indicated, Other (Comments)  [no structured activity, does work in her home and out in her yard] -- -- --  Stress Interventions -- Other (Comment)  [worries alot, education and support given, resources given about LCSW available if needed] -- -- --  Social Connections Interventions -- Other (Comment)  [has good support from her family] -- -- --       CCM Care Plan  Review of patient past medical history, allergies, medications, health status, including review of consultants reports, laboratory and other test data, was performed as part of comprehensive evaluation and provision of chronic care management services.   Care Plan : General Pharmacy (Adult)  Updates made by Manuela Neptune, RPH-CPP since 02/10/2023 12:00 AM     Problem: Disease Progression      Long-Range Goal: Disease Progression Prevented or Minimized   Start Date: 10/07/2022  Expected End Date: 01/05/2023  Recent Progress: On track  Priority: High  Note:   Current Barriers:  Unable to independently afford treatment regimen Reports cost of Mounjaro unaffordable as not covered through her BCBS Medicare plan Patient enrolled in Ozempic patient assistance program from [employer] through  11/04/2023 Unable to achieve control of T2DM   Pharmacist Clinical Goal(s):  patient will verbalize ability to afford treatment regimen achieve control of T2DM as evidenced by A1C  through collaboration with PharmD and provider.    Interventions: 1:1 collaboration with Brittany Munroe, NP regarding development and update of comprehensive plan of care as evidenced by provider attestation and co-signature Inter-disciplinary care team collaboration (see longitudinal plan of care) Reports picked up shipment of 4 boxes of Ozempic 1 mg from assistance program from office  Diabetes:  Uncontrolled; current treatment: glipizide 10 mg - 1/2 tablet (5 mg) twice daily 30 minutes before breakfast and supper Lantus 35 units twice daily Ozempic 1 mg weekly Reports tolerating well Previous therapies tried: metformin (GI side effects) Recalls current glucose readings ranging:  Morning fasting: 70-90 After supper: 160-185 Denies symptoms of hypoglycemia Statin: simvastatin 20 mg daily Encourage patient to have regular well-balanced meals and snacks spread throughout the day, while controlling carbohydrate portion sizes Patient noticed improvement in her appetite control/ability to limit  portion sizes since started Ozempic Exercise: reports active walking and gardening most days/week Reports working on weight loss Reports improvement in appetite control with Ozempic Current weight ~182 lbs Counsel patient to continue to monitor home blood sugar, keep log of results, have this record to review during upcoming appointments and to contact office sooner if needed for reading outside of established parameters or for symptoms such as any episodes of hypoglycemia Reports continues to carry glucose tablets to use if needed   Tobacco Use: Reports stopped smoking again since lung cancer screening on 3/12 Congratulate patient on smoking cessation Reports being active outside has helped with motivation and  distraction with quitting  Patient Goals/Self-Care Activities patient will:  - check glucose, document, and provide at future appointments - check blood pressure, document, and provide at future appointments - collaborate with provider on medication access solutions - engage in dietary modifications by having regular well-balanced meals and snacks spread throughout the day, while controlling carbohydrate portion sizes       Plan: Telephone follow up appointment with care management team member scheduled for:  05/02/2023 at 8:30 AM   Estelle GrumblesElisabeth Karinne Schmader, PharmD, [facility] BelvidereBCACP Clinical Pharmacist [facility] [place] [phone]

## 2023-02-10 NOTE — Patient Instructions (Signed)
Visit Information  Thank you for taking time to visit with me today. Please don't hesitate to contact me if I can be of assistance to you before our next scheduled telephone appointment.  Following are the goals we discussed today:   Goals Addressed             This Visit's Progress    Pharmacy Goals       Our goal A1c is less than 7%. This corresponds with fasting sugars less than 130 and 2 hour after meal sugars less than 180. Please keep a log of your results when checking your blood sugar   Our goal bad cholesterol, or LDL, is less than 70 . This is why it is important to continue taking your simvastatin.  Please have your medications and blood sugar log with you for our next telephone call.  Estelle Grumbles, PharmD, BCACP Clinical Pharmacist [facility] [phone]          Our next appointment is by telephone on 05/02/2023 at 8:30 AM   Please call the care guide team at [phone] if you need to cancel or reschedule your appointment.   Patient verbalizes understanding of instructions and care plan provided today and agrees to view in MyChart. Active MyChart status and patient understanding of how to access instructions and care plan via MyChart confirmed with patient.

## 2023-03-04 DIAGNOSIS — E1169 Type 2 diabetes mellitus with other specified complication: Secondary | ICD-10-CM

## 2023-03-04 DIAGNOSIS — E785 Hyperlipidemia, unspecified: Secondary | ICD-10-CM

## 2023-03-04 DIAGNOSIS — Z794 Long term (current) use of insulin: Secondary | ICD-10-CM

## 2023-03-04 DIAGNOSIS — E1165 Type 2 diabetes mellitus with hyperglycemia: Secondary | ICD-10-CM

## 2023-03-07 IMAGING — MG MM DIGITAL SCREENING BILAT W/ TOMO AND CAD
6 of 12 series · 6 of 36 positions shown · non-contrast
Comparison: Previous exam(s).

CLINICAL DATA: Screening.

EXAM:
DIGITAL SCREENING BILATERAL MAMMOGRAM WITH TOMOSYNTHESIS AND CAD
TECHNIQUE: Bilateral screening digital craniocaudal and mediolateral oblique
mammograms were obtained. Bilateral screening digital breast
tomosynthesis was performed. The images were evaluated with
computer-aided detection.

[L CC synth-2D (1 of 2)]
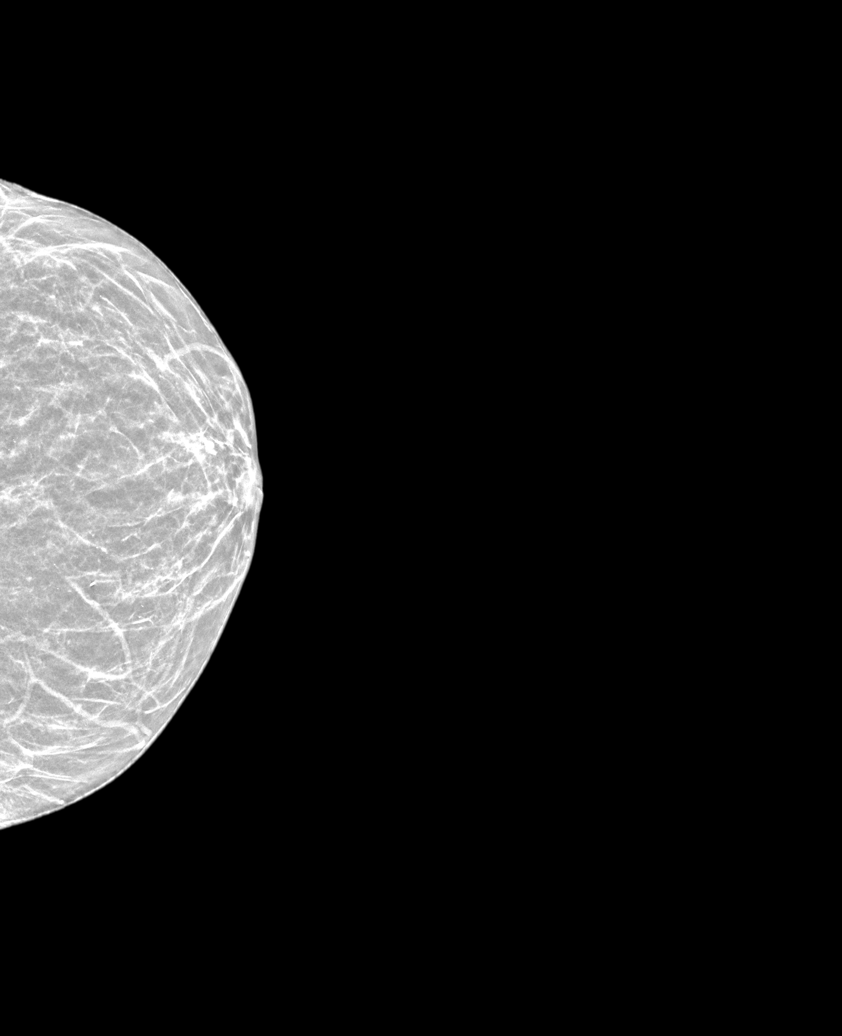

[R CC synth-2D (1 of 2)]
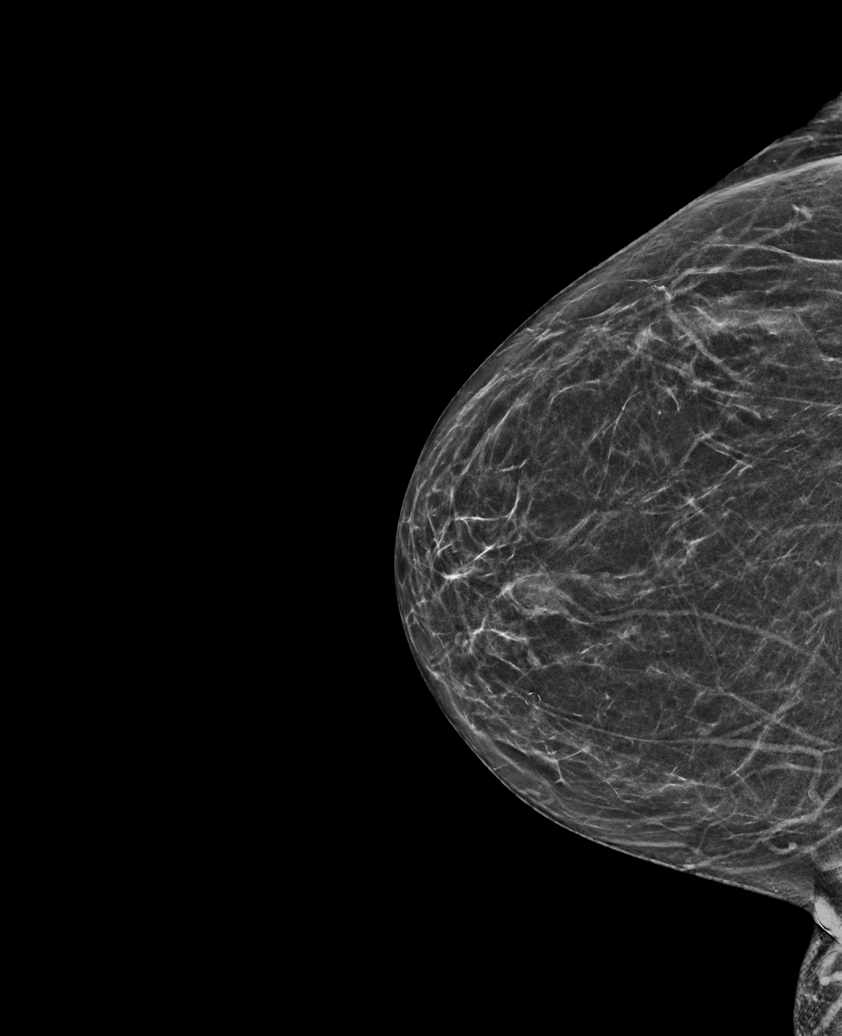

[R CC synth-2D (2 of 2)]
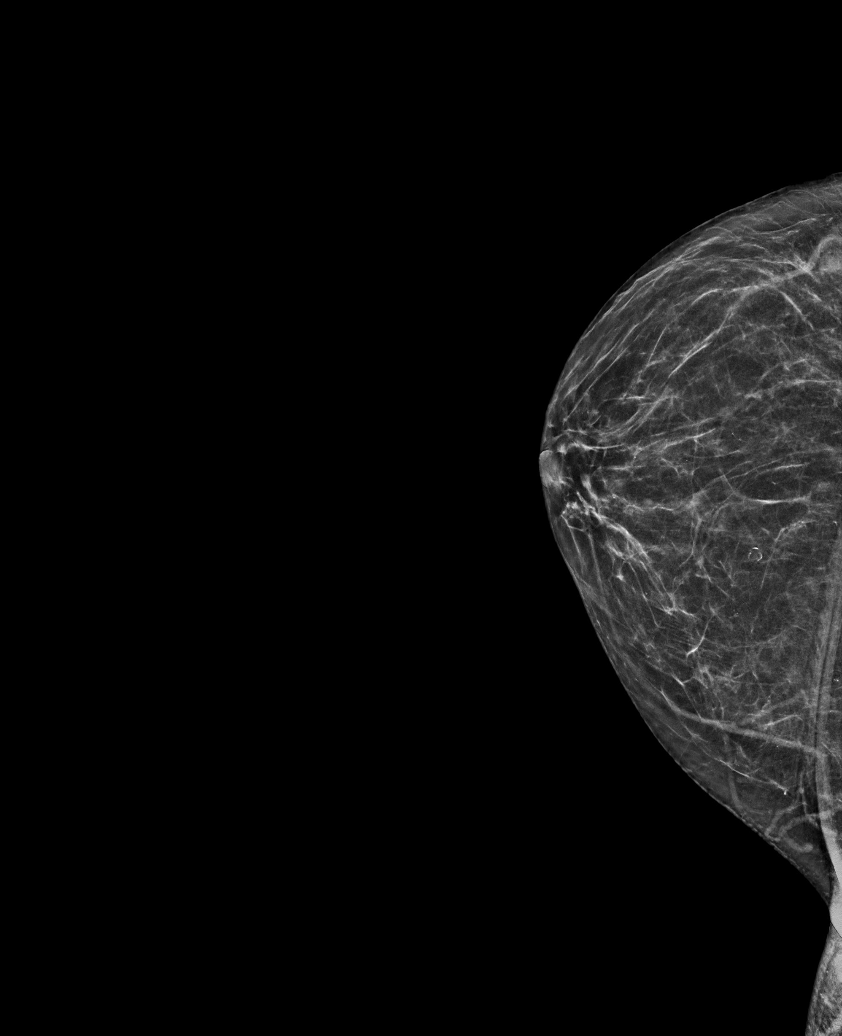

[R MLO synth-2D]
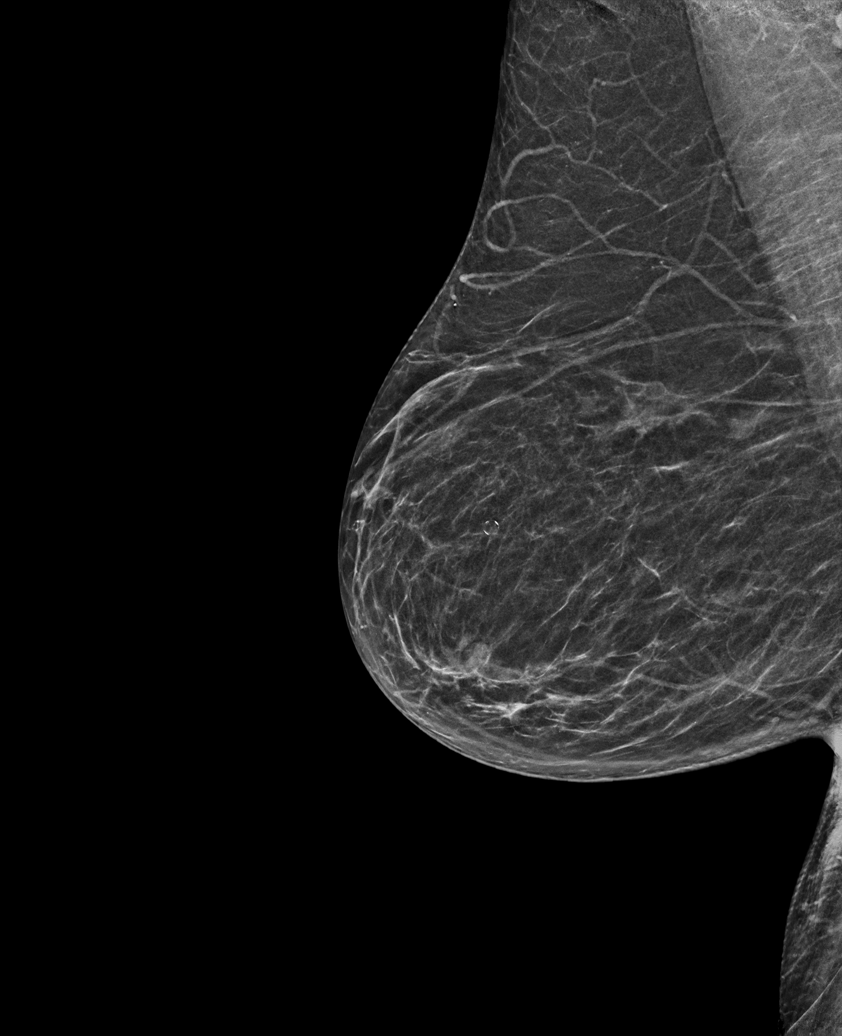

[L MLO synth-2D]
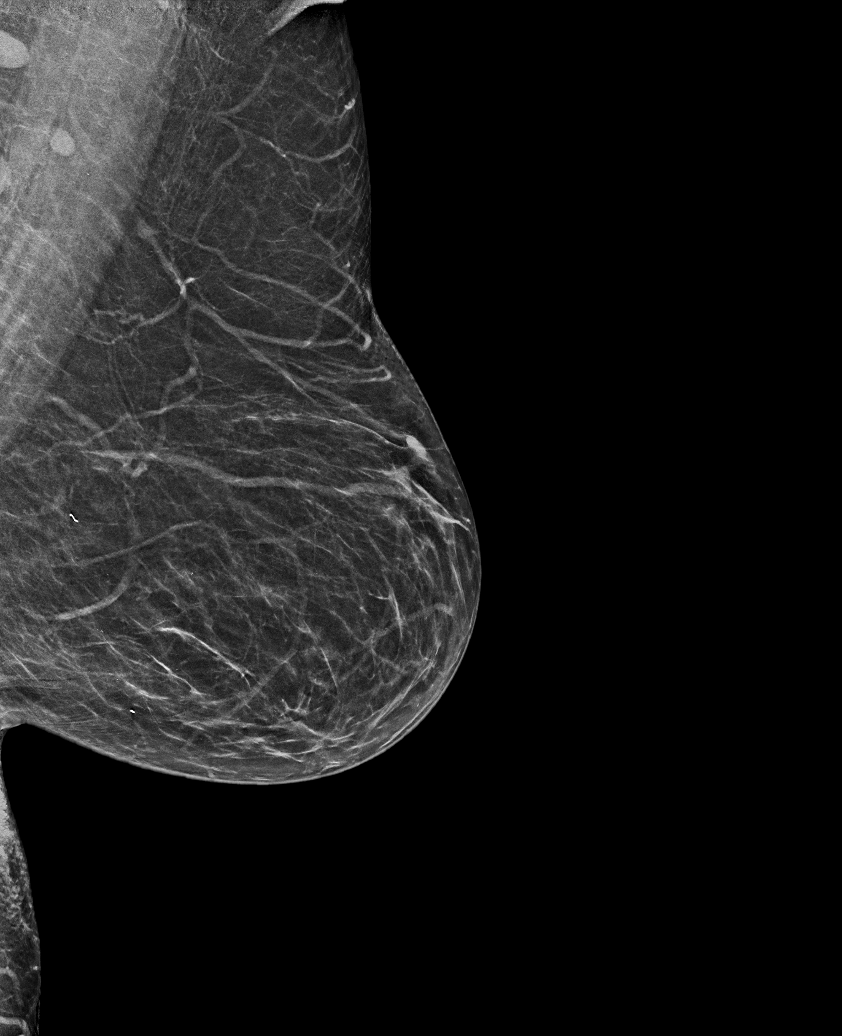

[L CC synth-2D (2 of 2)]
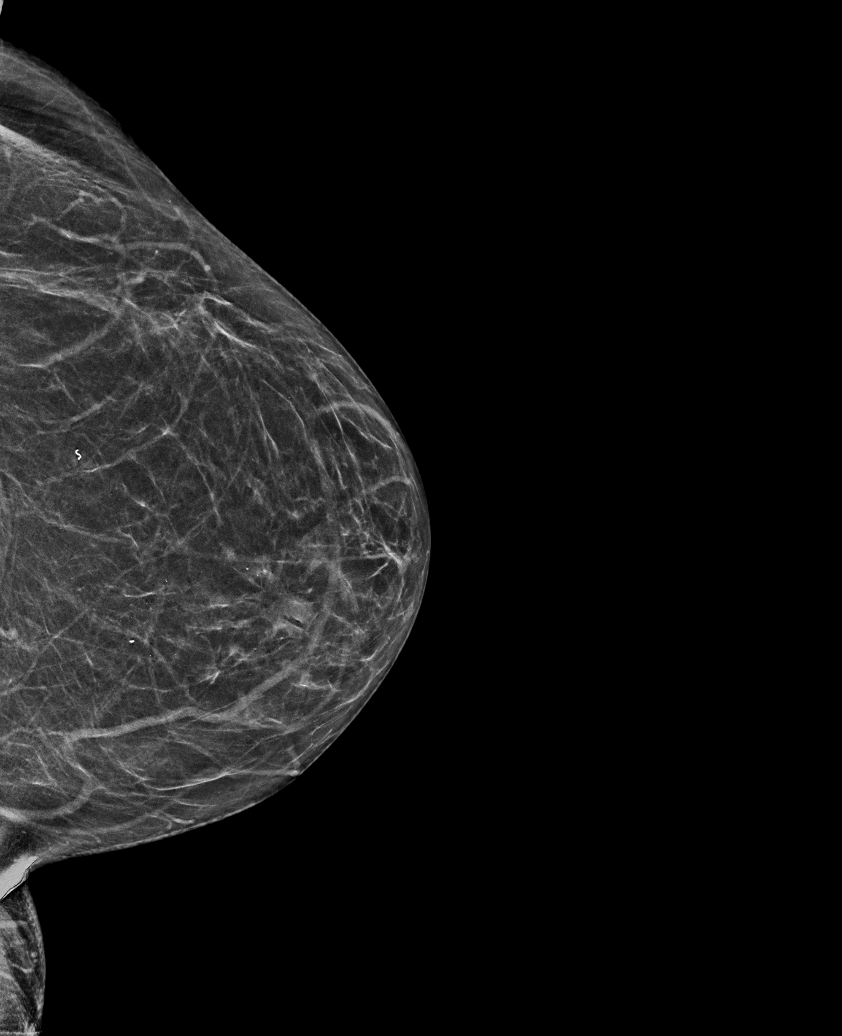

[6 of 36 positions shown; findings below may reference images not displayed]

ACR Breast Density Category b: There are scattered areas of
fibroglandular density.
FINDINGS: There are no findings suspicious for malignancy.
IMPRESSION: No mammographic evidence of malignancy. A result letter of this
screening mammogram will be mailed directly to the patient.

RECOMMENDATION:
Screening mammogram in one year. (Code:51-O-LD2)

BI-RADS CATEGORY  1: Negative.

## 2023-03-14 ENCOUNTER — Telehealth: Payer: Self-pay | Admitting: Internal Medicine

## 2023-03-14 NOTE — Telephone Encounter (Signed)
Called patient to schedule Medicare Annual Wellness Visit (AWV). No voicemail available to leave a message.  Last date of AWV: 08/21/21  Please schedule an appointment at any time with Kennedy Bucker, LPN  .  If any questions, please contact me.  Thank you ,  Verlee Rossetti; Care Guide Ambulatory Clinical Support  l [facility] Direct Dial: [phone]

## 2023-03-17 ENCOUNTER — Telehealth: Payer: Medicare Other

## 2023-04-16 ENCOUNTER — Ambulatory Visit (INDEPENDENT_AMBULATORY_CARE_PROVIDER_SITE_OTHER): Payer: Medicare Other

## 2023-04-16 ENCOUNTER — Telehealth: Payer: Medicare Other

## 2023-04-16 DIAGNOSIS — I10 Essential (primary) hypertension: Secondary | ICD-10-CM

## 2023-04-16 DIAGNOSIS — E1165 Type 2 diabetes mellitus with hyperglycemia: Secondary | ICD-10-CM

## 2023-04-16 NOTE — Patient Instructions (Signed)
Please call the care guide team at [phone] if you need to cancel or reschedule your appointment.   If you are experiencing a Mental Health or Behavioral Health Crisis or need someone to talk to, please call the Suicide and Crisis Lifeline: 988 call the [place] National Suicide Prevention Lifeline: 850-594-4715 or TTY: 579 615 5755 TTY 952-018-4103) to talk to a trained counselor call 1-800-273-TALK (toll free, 24 hour hotline)   Following is a copy of the CCM Program Consent:  CCM service includes personalized support from designated clinical staff supervised by the physician, including individualized plan of care and coordination with other care providers 24/7 contact phone numbers for assistance for urgent and routine care needs. Service will only be billed when office clinical staff spend 20 minutes or more in a month to coordinate care. Only one practitioner may furnish and bill the service in a calendar month. The patient may stop CCM services at amy time (effective at the end of the month) by phone call to the office staff. The patient will be responsible for cost sharing (co-pay) or up to 20% of the service fee (after annual deductible is met)  Following is a copy of your full provider care plan:   Goals Addressed             This Visit's Progress    CCM Expected Outcome:  Monitor, Self-Manage and Reduce Symptoms of Diabetes       Current Barriers:  Knowledge Deficits related to the importance of blood sugars in range to prevent complications from DM Care Coordination needs related to cost constraints of Mounjaro and her insurance not covering Mounjaro in a patient with DM Chronic Disease Management support and education needs related to effective management of DM Financial Constraints.  Last eye exam > 1 year ago in July  Lab Results  Component Value Date   HGBA1C 7.6 (A) 12/13/2022  Previously 8.8 in November.   Planned Interventions: Provided education to patient  about basic DM disease process. Review of DM and the patient has had some fluctuations in A1C over the last several years. Review of goal of A1C of <7.0. A1C trending down. Education provided.  Reviewed medications with patient and discussed importance of medication adherence. The patient works with the pharm D for medication adherence and education. The patient states she has been approved for assistance to get Ozempic. The patient just received her letter. The patient works with the pharm D.  She has been approved through 2024. The patient states what they sent her to the office was 0.5mg  but she is on the 1 mg. She is asking what she needs to do with the 0.5 mg. Will collaborate with the pcp and pharm D and get back with the patient concerning the Ozempic 0.5 mg.  Reviewed prescribed diet with patient heart healthy/ADA diet. Education given. The patient states her biggest meal of the day is Supper. She has been eating a lot of salads but states that her sugars are a little high after eating salads. Review of dressing and this may be the reason. Discussed bedtime snacks. The patient does not eat a bedtime snack. Education provided that she may want to start eating a bedtime snack as this may help with regulation of her blood sugars. Will attach information to the AVS; Counseled on importance of regular laboratory monitoring as prescribed. Labs are up to date. The patient states that she is happy her A1c is trending down. Review of goals    Discussed plans  with patient for ongoing care management follow up and provided patient with direct contact information for care management team;      Provided patient with written educational materials related to hypo and hyperglycemia and importance of correct treatment. Review and education.       Advised patient, providing education and rationale, to check cbg twice daily and when you have symptoms of low or high blood sugar and record. The patient states her blood  sugar range in am is 85 to 100, before meals 130 to 160 and after meals 160 to 185. Education on the goal of fasting <130 or less and the goal of post prandial of <180.  The patient is doing better since being back on Ozempic and mindful of what she is eating. Is happy about her readings and is sure when she has her new A1C in August it will be much better.   call provider for findings outside established parameters;       Referral made to pharmacy team for assistance with cost constraints and side effects of medications. Is working with the pharm D and has been approved for [employer]. Has been approved and is thankful for this.    Review of patient status, including review of consultants reports, relevant laboratory and other test results, and medications completed;       Advised patient to discuss changes in her DM health and well being with provider;      Screening for signs and symptoms of depression related to chronic disease state;        Assessed social determinant of health barriers;   The patient states she is overdue for eye exam. Plans to call and get one scheduled in the next couple of months. Encouraged the patient to have yearly exams. Does check her feet daily for scraps, scratches, cuts, sores. Review of keeping feet clean and dry.       The patient states she needs to make an appointment to get her eyes checked. Education and support given.   Symptom Management: Take medications as prescribed   Attend all scheduled provider appointments Call provider office for new concerns or questions  call the Suicide and Crisis Lifeline: 988 call the [place] National Suicide Prevention Lifeline: 7260546362 or TTY: 9086389588 TTY 662-111-5300) to talk to a trained counselor call 1-800-273-TALK (toll free, 24 hour hotline) if experiencing a Mental Health or Behavioral Health Crisis  schedule appointment with eye doctor check feet daily for cuts, sores or redness trim toenails straight  across manage portion size wash and dry feet carefully every day wear comfortable, cotton socks wear comfortable, well-fitting shoes  Follow Up Plan: Telephone follow up appointment with care management team member scheduled for: 06-25-2023 at 0900 am       CCM Expected Outcome:  Monitor, Self-Manage, and Reduce Symptoms of Hypertension       Current Barriers:  Knowledge Deficits related to the need to have normalized blood pressures to prevent the risk of heart attack or stroke Chronic Disease Management support and education needs related to effective management of HTN BP Readings from Last 3 Encounters:  12/13/22 106/62  11/08/22 116/68  10/31/22 114/76     Planned Interventions: Evaluation of current treatment plan related to hypertension self management and patient's adherence to plan as established by provider. The patient states her blood pressures are stable. Does not take at home. States that this is usually good for her. Knows when to call the provider for changes. Will continue to  monitor.;   Provided education to patient re: stroke prevention, s/s of heart attack and stroke. Praised the patient for continued smoking cessation; Reviewed prescribed diet heart healthy/ADA diet. Education and review. She usually eats her main meal at supper time. States that she eats a lot of salads recently. She denies any issues with heart healthy/ADA diet. Reviewed medications with patient and discussed importance of compliance. Is compliant with medications. Works with the pharm D on a regular basis. Denies any issues with medication compliance;  Counseled on the importance of exercise goals with target of 150 minutes per week. The patient is mowing her yard and actually doing well. She paces her activity. Denies any new issues with activity intolerance  Discussed plans with patient for ongoing care management follow up and provided patient with direct contact information for care management  team; Advised patient, providing education and rationale, to monitor blood pressure daily and record, calling PCP for findings outside established parameters. The patient states she checks her blood pressures when needed. States they are WNL. Denies any acute findings related to blood pressures ;  Reviewed scheduled/upcoming provider appointments including: Saw pcp 12-13-2022. No changes in the plan of care. Knows to call for new concerns or needs Advised patient to discuss changes in her HTN and heart health  with provider; Provided education on prescribed diet heart healthy/ADA. Education and support given. Review of healthy eating options ;  Discussed complications of poorly controlled blood pressure such as heart disease, stroke, circulatory complications, vision complications, kidney impairment, sexual dysfunction;  Screening for signs and symptoms of depression related to chronic disease state;  Assessed social determinant of health barriers;  Review of respiratory conditions. The patient denies any exacerbations with her breathing. States that she feels fine and saw the pulmonary provider in February. She stopped smoking in February and continues not to smoke. Praised for Survey[employer], minerals. The patient is enjoying the warmer weather and likes getting out and working in the yard. Denies any changes in her breathing.  Symptom Management: Take medications as prescribed   Attend all scheduled provider appointments Call provider office for new concerns or questions  call the Suicide and Crisis Lifeline: 988 call the [place] National Suicide Prevention Lifeline: 365-590-0755 or TTY: 306-097-1214 TTY (915) 867-4348) to talk to a trained counselor call 1-800-273-TALK (toll free, 24 hour hotline) if experiencing a Mental Health or Behavioral Health Crisis  check blood pressure weekly learn about high blood pressure keep a blood pressure log take blood pressure log to all doctor appointments call  doctor for signs and symptoms of high blood pressure develop an action plan for high blood pressure keep all doctor appointments take medications for blood pressure exactly as prescribed report new symptoms to your doctor  Follow Up Plan: Telephone follow up appointment with care management team member scheduled for: 06-25-2023 at 0900 am          Patient verbalizes understanding of instructions and care plan provided today and agrees to view in MyChart. Active MyChart status and patient understanding of how to access instructions and care plan via MyChart confirmed with patient.  Telephone follow up appointment with care management team member scheduled for: 06-25-2023 at 0900 am

## 2023-04-16 NOTE — Chronic Care Management (AMB) (Signed)
Chronic Care Management   CCM RN Visit Note  04/16/2023 Name: Brittany Pruitt MRN: [id number] DOB: 10-10-1961  Subjective: Brittany Pruitt is a 62 y.o. year old female who is a primary care patient of [name], NP. The patient was referred to the Chronic Care Management team for assistance with care management needs subsequent to provider initiation of CCM services and plan of care.    Today's Visit:  Engaged with patient by telephone for follow up visit.        Goals Addressed             This Visit's Progress    CCM Expected Outcome:  Monitor, Self-Manage and Reduce Symptoms of Diabetes       Current Barriers:  Knowledge Deficits related to the importance of blood sugars in range to prevent complications from DM Care Coordination needs related to cost constraints of Mounjaro and her insurance not covering Mounjaro in a patient with DM Chronic Disease Management support and education needs related to effective management of DM Financial Constraints.  Last eye exam > 1 year ago in July  Lab Results  Component Value Date   HGBA1C 7.6 (A) 12/13/2022  Previously 8.8 in November.   Planned Interventions: Provided education to patient about basic DM disease process. Review of DM and the patient has had some fluctuations in A1C over the last several years. Review of goal of A1C of <7.0. A1C trending down. Education provided.  Reviewed medications with patient and discussed importance of medication adherence. The patient works with the pharm D for medication adherence and education. The patient states she has been approved for assistance to get Ozempic. The patient just received her letter. The patient works with the pharm D.  She has been approved through 2024. The patient states what they sent her to the office was 0.5mg  but she is on the 1 mg. She is asking what she needs to do with the 0.5 mg. Will collaborate with the pcp and pharm D and get back with the patient concerning  the Ozempic 0.5 mg.  Reviewed prescribed diet with patient heart healthy/ADA diet. Education given. The patient states her biggest meal of the day is Supper. She has been eating a lot of salads but states that her sugars are a little high after eating salads. Review of dressing and this may be the reason. Discussed bedtime snacks. The patient does not eat a bedtime snack. Education provided that she may want to start eating a bedtime snack as this may help with regulation of her blood sugars. Will attach information to the AVS; Counseled on importance of regular laboratory monitoring as prescribed. Labs are up to date. The patient states that she is happy her A1c is trending down. Review of goals    Discussed plans with patient for ongoing care management follow up and provided patient with direct contact information for care management team;      Provided patient with written educational materials related to hypo and hyperglycemia and importance of correct treatment. Review and education.       Advised patient, providing education and rationale, to check cbg twice daily and when you have symptoms of low or high blood sugar and record. The patient states her blood sugar range in am is 85 to 100, before meals 130 to 160 and after meals 160 to 185. Education on the goal of fasting <130 or less and the goal of post prandial of <180.  The patient is doing better  since being back on Ozempic and mindful of what she is eating. Is happy about her readings and is sure when she has her new A1C in August it will be much better.   call provider for findings outside established parameters;       Referral made to pharmacy team for assistance with cost constraints and side effects of medications. Is working with the pharm D and has been approved for [employer]. Has been approved and is thankful for this.    Review of patient status, including review of consultants reports, relevant laboratory and other test results, and  medications completed;       Advised patient to discuss changes in her DM health and well being with provider;      Screening for signs and symptoms of depression related to chronic disease state;        Assessed social determinant of health barriers;   The patient states she is overdue for eye exam. Plans to call and get one scheduled in the next couple of months. Encouraged the patient to have yearly exams. Does check her feet daily for scraps, scratches, cuts, sores. Review of keeping feet clean and dry.       The patient states she needs to make an appointment to get her eyes checked. Education and support given.   Symptom Management: Take medications as prescribed   Attend all scheduled provider appointments Call provider office for new concerns or questions  call the Suicide and Crisis Lifeline: 988 call the [place] National Suicide Prevention Lifeline: 434-630-4353 or TTY: (435)272-2718 TTY (612) 592-0706) to talk to a trained counselor call 1-800-273-TALK (toll free, 24 hour hotline) if experiencing a Mental Health or Behavioral Health Crisis  schedule appointment with eye doctor check feet daily for cuts, sores or redness trim toenails straight across manage portion size wash and dry feet carefully every day wear comfortable, cotton socks wear comfortable, well-fitting shoes  Follow Up Plan: Telephone follow up appointment with care management team member scheduled for: 06-25-2023 at 0900 am       CCM Expected Outcome:  Monitor, Self-Manage, and Reduce Symptoms of Hypertension       Current Barriers:  Knowledge Deficits related to the need to have normalized blood pressures to prevent the risk of heart attack or stroke Chronic Disease Management support and education needs related to effective management of HTN BP Readings from Last 3 Encounters:  12/13/22 106/62  11/08/22 116/68  10/31/22 114/76     Planned Interventions: Evaluation of current treatment plan related to  hypertension self management and patient's adherence to plan as established by provider. The patient states her blood pressures are stable. Does not take at home. States that this is usually good for her. Knows when to call the provider for changes. Will continue to monitor.;   Provided education to patient re: stroke prevention, s/s of heart attack and stroke. Praised the patient for continued smoking cessation; Reviewed prescribed diet heart healthy/ADA diet. Education and review. She usually eats her main meal at supper time. States that she eats a lot of salads recently. She denies any issues with heart healthy/ADA diet. Reviewed medications with patient and discussed importance of compliance. Is compliant with medications. Works with the pharm D on a regular basis. Denies any issues with medication compliance;  Counseled on the importance of exercise goals with target of 150 minutes per week. The patient is mowing her yard and actually doing well. She paces her activity. Denies any new issues with  activity intolerance  Discussed plans with patient for ongoing care management follow up and provided patient with direct contact information for care management team; Advised patient, providing education and rationale, to monitor blood pressure daily and record, calling PCP for findings outside established parameters. The patient states she checks her blood pressures when needed. States they are WNL. Denies any acute findings related to blood pressures ;  Reviewed scheduled/upcoming provider appointments including: Saw pcp 12-13-2022. No changes in the plan of care. Knows to call for new concerns or needs Advised patient to discuss changes in her HTN and heart health  with provider; Provided education on prescribed diet heart healthy/ADA. Education and support given. Review of healthy eating options ;  Discussed complications of poorly controlled blood pressure such as heart disease, stroke, circulatory  complications, vision complications, kidney impairment, sexual dysfunction;  Screening for signs and symptoms of depression related to chronic disease state;  Assessed social determinant of health barriers;  Review of respiratory conditions. The patient denies any exacerbations with her breathing. States that she feels fine and saw the pulmonary provider in February. She stopped smoking in February and continues not to smoke. Praised for Surveyor, minerals. The patient is enjoying the warmer weather and likes getting out and working in the yard. Denies any changes in her breathing.  Symptom Management: Take medications as prescribed   Attend all scheduled provider appointments Call provider office for new concerns or questions  call the Suicide and Crisis Lifeline: 988 call the [place] National Suicide Prevention Lifeline: (336)148-4334 or TTY: (716) 292-9171 TTY (919) 039-1988) to talk to a trained counselor call 1-800-273-TALK (toll free, 24 hour hotline) if experiencing a Mental Health or Behavioral Health Crisis  check blood pressure weekly learn about high blood pressure keep a blood pressure log take blood pressure log to all doctor appointments call doctor for signs and symptoms of high blood pressure develop an action plan for high blood pressure keep all doctor appointments take medications for blood pressure exactly as prescribed report new symptoms to your doctor  Follow Up Plan: Telephone follow up appointment with care management team member scheduled for: 06-25-2023 at 0900 am          Plan:Telephone follow up appointment with care management team member scheduled for:  06-25-2023 at 0900 am  Alto Denver RN, MSN, CCM RN Care Manager  Chronic Care Management Direct Number: [phone]

## 2023-05-02 ENCOUNTER — Telehealth: Payer: Medicare Other

## 2023-05-04 DIAGNOSIS — Z794 Long term (current) use of insulin: Secondary | ICD-10-CM

## 2023-05-04 DIAGNOSIS — I10 Essential (primary) hypertension: Secondary | ICD-10-CM | POA: Diagnosis not present

## 2023-05-04 DIAGNOSIS — E1159 Type 2 diabetes mellitus with other circulatory complications: Secondary | ICD-10-CM

## 2023-05-06 ENCOUNTER — Other Ambulatory Visit: Payer: Self-pay | Admitting: Internal Medicine

## 2023-05-06 DIAGNOSIS — R202 Paresthesia of skin: Secondary | ICD-10-CM

## 2023-05-06 DIAGNOSIS — F419 Anxiety disorder, unspecified: Secondary | ICD-10-CM

## 2023-05-07 ENCOUNTER — Telehealth: Payer: Medicare Other

## 2023-05-07 NOTE — Telephone Encounter (Signed)
Requested medication (s) are due for refill today: Yes  Requested medication (s) are on the active medication list: Yes  Last refill:    Future visit scheduled: Today  Notes to clinic:  See requests.    Requested Prescriptions  Pending Prescriptions Disp Refills   clonazePAM (KLONOPIN) 0.5 MG tablet [Pharmacy Med Name: CLONAZEPAM TABS 0.5MG ] 45 tablet 0    Sig: TAKE ONE-HALF (1/2) TO ONE TABLET TWICE A DAY AS NEEDED FOR ANXIETY     Not Delegated - Psychiatry: Anxiolytics/Hypnotics 2 Failed - 05/06/2023  8:59 AM      Failed - This refill cannot be delegated      Failed - Urine Drug Screen completed in last 360 days      Passed - Patient is not pregnant      Passed - Valid encounter within last 6 months    Recent Outpatient Visits           4 months ago Type 2 diabetes mellitus with hyperglycemia, with long-term current use of insulin (HCC)   [facility] [place], [place], NP   6 months ago Multifocal pneumonia   [place] [facility] [place], [place] W, NP   8 months ago Type 2 diabetes mellitus with hyperglycemia, with long-term current use of insulin [facility])   [place] [facility] [place], [place], NP   11 months ago Encounter for general adult medical examination with abnormal findings   [place] [facility] [place], [place], NP   1 year ago Type 2 diabetes mellitus with hyperglycemia, with long-term current use of insulin [facility])   [place] [facility] [place], [place] W, NP               lisinopril (ZESTRIL) 5 MG tablet [Pharmacy Med Name: LISINOPRIL TABS 5MG ] 90 tablet 3    Sig: TAKE 1 TABLET DAILY     Cardiovascular:  ACE Inhibitors Passed - 05/06/2023  8:59 AM      Passed - Cr in normal range and within 180 days    Creat  Date Value Ref Range Status  12/13/2022 0.65 0.50 - 1.05 mg/dL Final   Creatinine, Urine  Date Value Ref Range Status  12/13/2022 110 20 - 275 mg/dL  Final         Passed - K in normal range and within 180 days    Potassium  Date Value Ref Range Status  12/13/2022 4.9 3.5 - 5.3 mmol/L Final  05/06/2012 4.5 3.5 - 5.1 mmol/L Final         Passed - Patient is not pregnant      Passed - Last BP in normal range    BP Readings from Last 1 Encounters:  12/13/22 106/62         Passed - Valid encounter within last 6 months    Recent Outpatient Visits           4 months ago Type 2 diabetes mellitus with hyperglycemia, with long-term current use of insulin [facility])   [place] [facility] [place], [place], NP   6 months ago Multifocal pneumonia   [place] [facility] [place], [place], NP   8 months ago Type 2 diabetes mellitus with hyperglycemia, with long-term current use of insulin [facility])   [place] [facility] [place], [place], NP   11 months ago Encounter for general adult medical examination  with abnormal findings   [place] [facility] [place], [place], NP   1 year ago Type 2 diabetes mellitus with hyperglycemia, with long-term current use of insulin [facility])   [place] [facility] [place], [place], NP               citalopram (CELEXA) 20 MG tablet [Pharmacy Med Name: CITALOPRAM HYDROBROMIDE TABS 20MG ] 90 tablet 3    Sig: TAKE 1 TABLET DAILY     Psychiatry:  Antidepressants - SSRI Passed - 05/06/2023  8:59 AM      Passed - Completed PHQ-2 or PHQ-9 in the last 360 days      Passed - Valid encounter within last 6 months    Recent Outpatient Visits           4 months ago Type 2 diabetes mellitus with hyperglycemia, with long-term current use of insulin [facility])   [place] [facility] [place], [place], NP   6 months ago Multifocal pneumonia   [place] [facility] [place], [place], NP   8 months ago Type 2 diabetes mellitus with hyperglycemia, with long-term current use of insulin [facility])   [place] [facility] [place], [place], NP   11 months ago Encounter for general adult medical examination with abnormal findings   [facility] [place], Salv[place], NP   1 year ago Type 2 diabetes mellitus with hyperglycemia, with long-term current use of insulin [facility])   [place] [facility] [place], [place], NP               gabapentin (NEURONTIN) 300 MG capsule [Pharmacy Med Name: GABAPENTIN CAPS 300MG ] 360 capsule 3    Sig: TAKE 1 CAPSULE FOUR TIMES A DAY     Neurology: Anticonvulsants - gabapentin Passed - 05/06/2023  8:59 AM      Passed - Cr in normal range and within 360 days    Creat  Date Value Ref Range Status  12/13/2022 0.65 0.50 - 1.05 mg/dL Final   Creatinine, Urine  Date Value Ref Range Status  12/13/2022 110 20 - 275 mg/dL Final         Passed - Completed PHQ-2 or PHQ-9 in the last 360 days      Passed - Valid encounter within last 12 months    Recent Outpatient Visits           4 months ago Type 2 diabetes mellitus with hyperglycemia, with long-term current use of insulin [facility])   [place] [facility] [place], [place], NP   6 months ago Multifocal pneumonia   [place] [facility] [place], [place], NP   8 months ago Type 2 diabetes mellitus with hyperglycemia, with long-term current use of insulin [facility])   [place] [facility] [place], [place], NP   11 months ago Encounter for general adult medical examination with abnormal findings   [place] [facility] [place], [place], NP   1 year ago Type 2 diabetes mellitus with hyperglycemia, with long-term current use of insulin [facility])   [place] [facility] [place], [place], NP               simvastatin (ZOCOR) 20 MG tablet [Pharmacy Med Name: SIMVASTATIN TABS 20MG ] 90 tablet 3    Sig: TAKE 1 TABLET DAILY AT 6 P.M.  Cardiovascular:  Antilipid - Statins Failed - 05/06/2023  8:59 AM       Failed - Lipid Panel in normal range within the last 12 months    Cholesterol, Total  Date Value Ref Range Status  02/28/2017 135 100 - 199 mg/dL Final   Cholesterol  Date Value Ref Range Status  12/13/2022 129 <200 mg/dL Final   LDL Cholesterol (Calc)  Date Value Ref Range Status  12/13/2022 64 mg/dL (calc) Final    Comment:    Reference range: <100 . Desirable range <100 mg/dL for primary prevention;   <70 mg/dL for patients with CHD or diabetic patients  with > or = 2 CHD risk factors. Marland Kitchen LDL-C is now calculated using the Martin-Hopkins  calculation, which is a validated novel method providing  better accuracy than the Friedewald equation in the  estimation of LDL-C.  Horald Pollen et al. Lenox Ahr. 6962;952(84): 2061-2068  (http://education.Quest[employer].com/faq/FAQ164)    HDL  Date Value Ref Range Status  12/13/2022 36 (L) > OR = 50 mg/dL Final  13/24/4010 38 (L) >39 mg/dL Final   Triglycerides  Date Value Ref Range Status  12/13/2022 233 (H) <150 mg/dL Final    Comment:    . If a non-fasting specimen was collected, consider repeat triglyceride testing on a fasting specimen if clinically indicated.  Perry Mount et al. J. of Clin. Lipidol. 2015;9:129-169. Marland Kitchen          Passed - Patient is not pregnant      Passed - Valid encounter within last 12 months    Recent Outpatient Visits           4 months ago Type 2 diabetes mellitus with hyperglycemia, with long-term current use of insulin (HCC)   [place] [facility] [place], [place], NP   6 months ago Multifocal pneumonia   [place] [facility] [place], [place], NP   8 months ago Type 2 diabetes mellitus with hyperglycemia, with long-term current use of insulin [facility])   [place] [facility] [place], [place], NP   11 months ago Encounter for general adult medical examination with abnormal findings   [place] [facility] [place], Salv[place], NP   1  year ago Type 2 diabetes mellitus with hyperglycemia, with long-term current use of insulin [place] [facility])   [place] [facility] [place], [place], NP               glipiZIDE (GLUCOTROL) 10 MG tablet [Pharmacy Med Name: GLIPIZIDE TABS 10MG ] 180 tablet 3    Sig: TAKE 1 TABLET TWICE A DAY BEFORE MEALS     Endocrinology:  Diabetes - Sulfonylureas Passed - 05/06/2023  8:59 AM      Passed - HBA1C is between 0 and 7.9 and within 180 days    Hemoglobin A1C  Date Value Ref Range Status  12/13/2022 7.6 (A) 4.0 - 5.6 % Final   Hgb A1c MFr Bld  Date Value Ref Range Status  05/31/2022 8.7 (H) <5.7 % of total Hgb Final    Comment:    For someone without known diabetes, a hemoglobin A1c value of 6.5% or greater indicates that they may have  diabetes and this should be confirmed with a follow-up  test. . For someone with known diabetes, a value <7% indicates  that their diabetes is well controlled and a value  greater than or equal to 7% indicates suboptimal  control. A1c targets should be  individualized based on  duration of diabetes, age, comorbid conditions, and  other considerations. . Currently, no consensus exists regarding use of hemoglobin A1c for diagnosis of diabetes for children. .          Passed - Cr in normal range and within 360 days    Creat  Date Value Ref Range Status  12/13/2022 0.65 0.50 - 1.05 mg/dL Final   Creatinine, Urine  Date Value Ref Range Status  12/13/2022 110 20 - 275 mg/dL Final         Passed - Valid encounter within last 6 months    Recent Outpatient Visits           4 months ago Type 2 diabetes mellitus with hyperglycemia, with long-term current use of insulin [facility])   [place] [facility] [place], [place], NP   6 months ago Multifocal pneumonia   [place] [facility] [place], [place], NP   8 months ago Type 2 diabetes mellitus with hyperglycemia, with long-term current use of insulin [facility])    [place] [facility] [place], [place], NP   11 months ago Encounter for general adult medical examination with abnormal findings   [place] [facility] [place], [place], NP   1 year ago Type 2 diabetes mellitus with hyperglycemia, with long-term current use of insulin [facility])   [place] [facility] [place], [place], NP               busPIRone (BUSPAR) 5 MG tablet [Pharmacy Med Name: BUSPIRONE HCL TABS 5MG ] 180 tablet 3    Sig: TAKE 1 TABLET TWICE A DAY     Psychiatry: Anxiolytics/Hypnotics - Non-controlled Passed - 05/06/2023  8:59 AM      Passed - Valid encounter within last 12 months    Recent Outpatient Visits           4 months ago Type 2 diabetes mellitus with hyperglycemia, with long-term current use of insulin [facility])   [place] [facility] [place], [place], NP   6 months ago Multifocal pneumonia   [place] [facility] [place], [place], NP   8 months ago Type 2 diabetes mellitus with hyperglycemia, with long-term current use of insulin [facility])   [place] [facility] [place], [place], NP   11 months ago Encounter for general adult medical examination with abnormal findings   [place] [facility] [place], [place], NP   1 year ago Type 2 diabetes mellitus with hyperglycemia, with long-term current use of insulin [facility])   [place] [facility] [place], [place], NP               meclizine (ANTIVERT) 25 MG tablet [Pharmacy Med Name: MECLIZINE TABS 25MG ] 30 tablet 35    Sig: TAKE 1 TABLET THREE TIMES A DAY AS NEEDED FOR DIZZINESS     Not Delegated - Gastroenterology: Antiemetics Failed - 05/06/2023  8:59 AM      Failed - This refill cannot be delegated      Passed - Valid encounter within last 6 months    Recent Outpatient Visits           4 months ago Type 2 diabetes mellitus with hyperglycemia, with long-term current use of insulin  [facility])    [facility] [place], [place], NP   6 months ago  Multifocal pneumonia   [place] [facility] [place], [place], NP   8 months ago Type 2 diabetes mellitus with hyperglycemia, with long-term current use of insulin [facility])   [place] [facility] [place], [place], NP   11 months ago Encounter for general adult medical examination with abnormal findings   [place] [facility] [place], [place], NP   1 year ago Type 2 diabetes mellitus with hyperglycemia, with long-term current use of insulin [facility])   [place] [facility] [address], [place], [place]

## 2023-05-09 ENCOUNTER — Telehealth: Payer: Self-pay | Admitting: Pharmacist

## 2023-05-09 NOTE — Progress Notes (Signed)
   Outreach Note  05/07/2023 Name: Brittany Pruitt MRN: [id number] DOB: 1961-08-13  Referred by: [name], NP Reason for referral : No chief complaint on file.   Was unable to reach patient via telephone today and have left HIPAA compliant voicemail asking patient to return my call.   Follow Up Plan: Will collaborate with Care Guide to outreach to schedule follow up with me  Estelle Grumbles, PharmD, [facility] Clinical Pharmacist [facility] [phone]

## 2023-05-12 ENCOUNTER — Other Ambulatory Visit: Payer: Self-pay | Admitting: Internal Medicine

## 2023-05-12 NOTE — Telephone Encounter (Signed)
Requested Prescriptions  Pending Prescriptions Disp Refills   omeprazole (PRILOSEC) 20 MG capsule [Pharmacy Med Name: OMEPRAZOLE DR CAPS 20MG ] 90 capsule 2    Sig: TAKE 1 CAPSULE DAILY     Gastroenterology: Proton Pump Inhibitors Passed - 05/12/2023  1:45 AM      Passed - Valid encounter within last 12 months    Recent Outpatient Visits           5 months ago Type 2 diabetes mellitus with hyperglycemia, with long-term current use of insulin [facility])   [place] [facility] [place], Salvadore Oxford, NP   6 months ago Multifocal pneumonia   [place] [facility] [place], [place] W, NP   8 months ago Type 2 diabetes mellitus with hyperglycemia, with long-term current use of insulin [facility])   [place] [place], Salvadore Oxford, NP   11 months ago Encounter for general adult medical examination with abnormal findings   [place] [facility] [place], [place] W, NP   1 year ago Type 2 diabetes mellitus with hyperglycemia, with long-term current use of insulin [facility])   [place] [facility] [place], Salvadore Oxford, NP       Future Appointments             In 1 month [place], Salvadore Oxford, NP [place] [facility], [facility]

## 2023-05-19 ENCOUNTER — Other Ambulatory Visit: Payer: Self-pay | Admitting: Internal Medicine

## 2023-05-19 ENCOUNTER — Ambulatory Visit: Payer: Medicare Other

## 2023-05-19 VITALS — Ht 63.0 in | Wt 170.0 lb

## 2023-05-19 DIAGNOSIS — Z0000 Encounter for general adult medical examination without abnormal findings: Secondary | ICD-10-CM

## 2023-05-19 NOTE — Progress Notes (Signed)
Subjective:   Brittany Pruitt is a 62 y.o. female who presents for Medicare Annual (Subsequent) preventive examination.  Visit Complete: Virtual  I connected with  Brittany Pruitt on 05/19/23 by a audio enabled telemedicine application and verified that I am speaking with the correct person using two identifiers.  Patient Location: Home  Provider Location: Office/Clinic  I discussed the limitations of evaluation and management by telemedicine. The patient expressed understanding and agreed to proceed.  Per patient no change in vitals since last visit, unable to obtain new vitals due to telehealth visit     Review of Systems     Cardiac Risk Factors include: diabetes mellitus;dyslipidemia;hypertension;obesity (BMI >30kg/m2)     Objective:    Today's Vitals   05/19/23 1111  Weight: 170 lb (77.1 kg)  Height: 5\' 3"  (1.6 m)   Body mass index is 30.11 kg/m.     05/19/2023   11:24 AM 10/29/2022    9:01 PM 02/09/2016    7:42 AM  Advanced Directives  Does Patient Have a Medical Advance Directive? No No No  Would patient like information on creating a medical advance directive?   No - patient declined information    Current Medications (verified) Outpatient Encounter Medications as of 05/19/2023  Medication Sig   acetaminophen (TYLENOL) 500 MG tablet Take 500 mg by mouth every 6 (six) hours as needed for moderate pain.   albuterol (VENTOLIN HFA) 108 (90 Base) MCG/ACT inhaler Inhale 2 puffs into the lungs every 6 (six) hours as needed for wheezing or shortness of breath.   busPIRone (BUSPAR) 5 MG tablet TAKE 1 TABLET TWICE A DAY   Cholecalciferol (VITAMIN D3) 50 MCG (2000 UT) capsule Take 2,000 Units by mouth daily.   citalopram (CELEXA) 20 MG tablet TAKE 1 TABLET DAILY   clonazePAM (KLONOPIN) 0.5 MG tablet TAKE ONE-HALF (1/2) TO ONE TABLET TWICE A DAY AS NEEDED FOR ANXIETY   cyanocobalamin (VITAMIN B12) 1000 MCG tablet Take 1,000 mcg by mouth daily.   gabapentin  (NEURONTIN) 300 MG capsule TAKE 1 CAPSULE FOUR TIMES A DAY   glipiZIDE (GLUCOTROL) 10 MG tablet TAKE 1 TABLET TWICE A DAY BEFORE MEALS   insulin glargine (LANTUS SOLOSTAR) 100 UNIT/ML Solostar Pruitt Inject 35 Units into the skin 2 (two) times daily.   lisinopril (ZESTRIL) 5 MG tablet TAKE 1 TABLET DAILY   meclizine (ANTIVERT) 25 MG tablet TAKE 1 TABLET THREE TIMES A DAY AS NEEDED FOR DIZZINESS   Multiple Vitamins-Minerals (CENTRUM SILVER 50+WOMEN PO) Take 1 tablet by mouth daily.   Omega-3 Fatty Acids (FISH OIL) 1000 MG CAPS Take 1 capsule by mouth in the morning and at bedtime.   omeprazole (PRILOSEC) 20 MG capsule TAKE 1 CAPSULE DAILY   Semaglutide, 1 MG/DOSE, (OZEMPIC, 1 MG/DOSE,) 4 MG/3ML SOPN Inject 1 mg into the skin once a week.   simvastatin (ZOCOR) 20 MG tablet TAKE 1 TABLET DAILY AT 6 P.M.   No facility-administered encounter medications on file as of 05/19/2023.    Allergies (verified) Sulfa antibiotics   History: Past Medical History:  Diagnosis Date   Anxiety    Chicken pox    Depression    Diabetes mellitus without complication (HCC)    Frequent headaches    Hyperlipidemia    Past Surgical History:  Procedure Laterality Date   ABDOMINAL HYSTERECTOMY  2013   total   BREAST BIOPSY Left 2013   benign   CESAREAN SECTION     COLONOSCOPY WITH PROPOFOL N/A 02/09/2016   Procedure:  COLONOSCOPY WITH PROPOFOL;  Surgeon: Wallace Cullens, MD;  Location: [facility] ENDOSCOPY;  Service: Gastroenterology;  Laterality: N/A;   TONSILLECTOMY AND ADENOIDECTOMY     Family History  Problem Relation Age of Onset   Uterine cancer Mother    Breast cancer Mother 103   Pancreatic cancer Mother    Heart disease Father    Hypertension Father    Anxiety disorder Father    Diabetes Father    Uterine cancer Maternal Aunt    Breast cancer Maternal Aunt 53   Heart disease Paternal Grandfather    Social History   Socioeconomic History   Marital status: Married    Spouse name: Disney Ruggiero    Number of children: Not on file   Years of education: Not on file   Highest education level: Not on file  Occupational History   Not on file  Tobacco Use   Smoking status: Former    Current packs/day: 0.00    Average packs/day: 1 pack/day for 30.0 years (30.0 ttl pk-yrs)    Types: Cigarettes    Quit date: 01/14/2023    Years since quitting: 0.3   Smokeless tobacco: Never  Vaping Use   Vaping status: Never Used  Substance and Sexual Activity   Alcohol use: No    Alcohol/week: 0.0 standard drinks of alcohol   Drug use: Never   Sexual activity: Yes    Partners: Male  Other Topics Concern   Not on file  Social History Narrative   Pt lives with husband Casimiro Needle.  Is not currently working.    Social Determinants of Health   Financial Resource Strain: Low Risk  (05/19/2023)   Overall Financial Resource Strain (CARDIA)    Difficulty of Paying Living Expenses: Not hard at all  Food Insecurity: No Food Insecurity (05/19/2023)   Hunger Vital Sign    Worried About Running Out of Food in the Last Year: Never true    Ran Out of Food in the Last Year: Never true  Transportation Needs: No Transportation Needs (05/19/2023)   PRAPARE - Administrator, Civil Service (Medical): No    Lack of Transportation (Non-Medical): No  Physical Activity: Inactive (05/19/2023)   Exercise Vital Sign    Days of Exercise per Week: 0 days    Minutes of Exercise per Session: 0 min  Stress: No Stress Concern Present (05/19/2023)   [employer] of Occupational Health - Occupational Stress Questionnaire    Feeling of Stress : Not at all  Social Connections: Moderately Isolated (05/19/2023)   Social Connection and Isolation Panel [NHANES]    Frequency of Communication with Friends and Family: More than three times a week    Frequency of Social Gatherings with Friends and Family: More than three times a week    Attends Religious Services: Never    Database administrator or Organizations: No     Attends Engineer, structural: Never    Marital Status: Married    Tobacco Counseling Counseling given: Not Answered   Clinical Intake:  Pre-visit preparation completed: Yes  Pain : No/denies pain     Nutritional Status: BMI > 30  Obese Nutritional Risks: None Diabetes: Yes CBG done?: No Did pt. bring in CBG monitor from home?: No  How often do you need to have someone help you when you read instructions, pamphlets, or other written materials from your doctor or pharmacy?: 1 - Never  Interpreter Needed?: No  Information entered by :: NAllen LPN  Activities of Daily Living    05/19/2023   11:13 AM 12/13/2022   11:07 AM  In your present state of health, do you have any difficulty performing the following activities:  Hearing? 1 1  Comment has hearing loss, but no hearing aids   Vision? 0 1  Difficulty concentrating or making decisions? 0 0  Walking or climbing stairs? 0 1  Dressing or bathing? 0 0  Doing errands, shopping? 0 1  Preparing Food and eating ? N   Using the Toilet? N   In the past six months, have you accidently leaked urine? N   Do you have problems with loss of bowel control? N   Managing your Medications? N   Managing your Finances? N   Housekeeping or managing your Housekeeping? N     Patient Care Team: [name], NP as PCP - General (Internal Medicine) Ronney Asters Jackelyn Poling, RPH-CPP as Pharmacist Arlana Pouch Megan Mans, RN as Case Manager (General Practice)  Indicate any recent Medical Services you may have received from other than [facility] providers in the past year (date may be approximate).     Assessment:   This is a routine wellness examination for [place].  Hearing/Vision screen Hearing Screening - Comments:: States has hearing issues but does not have hearing aids or see audiology Vision Screening - Comments:: Regular eye exams, Dr. Yvette Rack  Dietary issues and exercise activities discussed:     Goals Addressed              This Visit's Progress    Patient Stated       05/19/2023, keeping A1C under control       Depression Screen    05/19/2023   11:27 AM 12/13/2022   11:06 AM 09/06/2022   11:52 AM 05/31/2022    1:16 PM 02/21/2022    1:41 PM 11/21/2021   11:12 AM 08/21/2021    2:31 PM  PHQ 2/9 Scores  PHQ - 2 Score 0 2 2 2 2 2 2   PHQ- 9 Score 0 8 8 9 8 5 4     Fall Risk    05/19/2023   11:25 AM 01/20/2023    8:39 AM 12/13/2022   11:07 AM 10/07/2022    9:39 AM 09/06/2022   11:52 AM  Fall Risk   Falls in the past year? 0 0 0 0 0  Number falls in past yr: 0 0  0   Injury with Fall? 0 0 0 0 0  Risk for fall due to : Medication side effect No Fall Risks No Fall Risks Impaired balance/gait;Other (Comment)   Risk for fall due to: Comment    has vertigo- admits balance is off   Follow up Falls prevention discussed;Falls evaluation completed Falls evaluation completed;Education provided  Falls evaluation completed;Education provided;Falls prevention discussed     MEDICARE RISK AT HOME:  Medicare Risk at Home - 05/19/23 1125     Any stairs in or around the home? No    If so, are there any without handrails? No    Home free of loose throw rugs in walkways, pet beds, electrical cords, etc? Yes    Adequate lighting in your home to reduce risk of falls? Yes    Life alert? No    Use of a cane, walker or w/c? No    Grab bars in the bathroom? No    Shower chair or bench in shower? Yes    Elevated toilet seat or a handicapped toilet? Yes  TIMED UP AND GO:  Was the test performed?  No    Cognitive Function:        05/19/2023   11:28 AM  6CIT Screen  What Year? 0 points  What month? 0 points  What time? 0 points  Count back from 20 0 points  Months in reverse 0 points  Repeat phrase 0 points  Total Score 0 points    Immunizations Immunization History  Administered Date(s) Administered   Influenza,inj,Quad PF,6+ Mos 09/03/2019, 12/08/2020, 08/21/2021, 09/06/2022   PFIZER(Purple  Top)SARS-COV-2 Vaccination 02/18/2020, 03/11/2020   Pneumococcal Conjugate-13 05/25/2015   Pneumococcal Polysaccharide-23 06/17/2019   Tdap 06/16/2017    TDAP status: Up to date  Flu Vaccine status: Up to date  Pneumococcal vaccine status: Up to date  Covid-19 vaccine status: Information provided on how to obtain vaccines.   Qualifies for Shingles Vaccine? Yes   Zostavax completed No   Shingrix Completed?: No.    Education has been provided regarding the importance of this vaccine. Patient has been advised to call insurance company to determine out of pocket expense if they have not yet received this vaccine. Advised may also receive vaccine at local pharmacy or Health Dept. Verbalized acceptance and understanding.  Screening Tests Health Maintenance  Topic Date Due   Zoster Vaccines- Shingrix (1 of 2) Never done   OPHTHALMOLOGY EXAM  06/05/2019   COVID-19 Vaccine (3 - Pfizer risk series) 04/08/2020   FOOT EXAM  02/22/2023   INFLUENZA VACCINE  06/05/2023   HEMOGLOBIN A1C  06/13/2023   Diabetic kidney evaluation - eGFR measurement  12/14/2023   Diabetic kidney evaluation - Urine ACR  12/14/2023   Lung Cancer Screening  01/14/2024   Medicare Annual Wellness (AWV)  05/18/2024   MAMMOGRAM  09/13/2024   Colonoscopy  02/08/2026   DTaP/Tdap/Td (2 - Td or Tdap) 06/17/2027   Hepatitis C Screening  Completed   HIV Screening  Completed   HPV VACCINES  Aged Out    Health Maintenance  Health Maintenance Due  Topic Date Due   Zoster Vaccines- Shingrix (1 of 2) Never done   OPHTHALMOLOGY EXAM  06/05/2019   COVID-19 Vaccine (3 - Pfizer risk series) 04/08/2020   FOOT EXAM  02/22/2023    Colorectal cancer screening: Type of screening: Colonoscopy. Completed 02/09/2016. Repeat every 10 years  Mammogram status: Completed 09/13/2022. Repeat every year  Bone Density status: n/a  Lung Cancer Screening: (Low Dose CT Chest recommended if Age 77-80 years, 20 pack-year currently smoking OR  have quit w/in 15years.) does qualify.   Lung Cancer Screening Referral: CT scan 01/14/2023  Additional Screening:  Hepatitis C Screening: does qualify; Completed 12/08/2020  Vision Screening: Recommended annual ophthalmology exams for early detection of glaucoma and other disorders of the eye. Is the patient up to date with their annual eye exam?  No  Who is the provider or what is the name of the office in which the patient attends annual eye exams? Dr. Alvester Morin If pt is not established with a provider, would they like to be referred to a provider to establish care? No .   Dental Screening: Recommended annual dental exams for proper oral hygiene  Diabetic Foot Exam: Diabetic Foot Exam: Overdue, Pt has been advised about the importance in completing this exam. Pt is scheduled for diabetic foot exam on next appointment.  Community Resource Referral / Chronic Care Management: CRR required this visit?  No   CCM required this visit?  No     Plan:  I have personally reviewed and noted the following in the patient's chart:   Medical and social history Use of alcohol, tobacco or illicit drugs  Current medications and supplements including opioid prescriptions. Patient is not currently taking opioid prescriptions. Functional ability and status Nutritional status Physical activity Advanced directives List of other physicians Hospitalizations, surgeries, and ER visits in previous 12 months Vitals Screenings to include cognitive, depression, and falls Referrals and appointments  In addition, I have reviewed and discussed with patient certain preventive protocols, quality metrics, and best practice recommendations. A written personalized care plan for preventive services as well as general preventive health recommendations were provided to patient.     Barb Merino, LPN   07/02/5620   After Visit Summary: (MyChart) Due to this being a telephonic visit, the after visit summary with  patients personalized plan was offered to patient via MyChart   Nurse Notes: none

## 2023-05-19 NOTE — Patient Instructions (Signed)
Brittany Pruitt , Thank you for taking time to come for your Medicare Wellness Visit. I appreciate your ongoing commitment to your health goals. Please review the following plan we discussed and let me know if I can assist you in the future.   These are the goals we discussed:  Goals      CCM Expected Outcome:  Monitor, Self-Manage and Reduce Symptoms of Diabetes     Current Barriers:  Knowledge Deficits related to the importance of blood sugars in range to prevent complications from DM Care Coordination needs related to cost constraints of Mounjaro and her insurance not covering Mounjaro in a patient with DM Chronic Disease Management support and education needs related to effective management of DM Financial Constraints.  Last eye exam > 1 year ago in July  Lab Results  Component Value Date   HGBA1C 7.6 (A) 12/13/2022  Previously 8.8 in November.   Planned Interventions: Provided education to patient about basic DM disease process. Review of DM and the patient has had some fluctuations in A1C over the last several years. Review of goal of A1C of <7.0. A1C trending down. Education provided.  Reviewed medications with patient and discussed importance of medication adherence. The patient works with the pharm D for medication adherence and education. The patient states she has been approved for assistance to get Ozempic. The patient just received her letter. The patient works with the pharm D.  She has been approved through 2024. The patient states what they sent her to the office was 0.5mg  but she is on the 1 mg. She is asking what she needs to do with the 0.5 mg. Will collaborate with the pcp and pharm D and get back with the patient concerning the Ozempic 0.5 mg.  Reviewed prescribed diet with patient heart healthy/ADA diet. Education given. The patient states her biggest meal of the day is Supper. She has been eating a lot of salads but states that her sugars are a little high after eating  salads. Review of dressing and this may be the reason. Discussed bedtime snacks. The patient does not eat a bedtime snack. Education provided that she may want to start eating a bedtime snack as this may help with regulation of her blood sugars. Will attach information to the AVS; Counseled on importance of regular laboratory monitoring as prescribed. Labs are up to date. The patient states that she is happy her A1c is trending down. Review of goals    Discussed plans with patient for ongoing care management follow up and provided patient with direct contact information for care management team;      Provided patient with written educational materials related to hypo and hyperglycemia and importance of correct treatment. Review and education.       Advised patient, providing education and rationale, to check cbg twice daily and when you have symptoms of low or high blood sugar and record. The patient states her blood sugar range in am is 85 to 100, before meals 130 to 160 and after meals 160 to 185. Education on the goal of fasting <130 or less and the goal of post prandial of <180.  The patient is doing better since being back on Ozempic and mindful of what she is eating. Is happy about her readings and is sure when she has her new A1C in August it will be much better.   call provider for findings outside established parameters;       Referral made to pharmacy team  for assistance with cost constraints and side effects of medications. Is working with the pharm D and has been approved for [employer]. Has been approved and is thankful for this.    Review of patient status, including review of consultants reports, relevant laboratory and other test results, and medications completed;       Advised patient to discuss changes in her DM health and well being with provider;      Screening for signs and symptoms of depression related to chronic disease state;        Assessed social determinant of health barriers;    The patient states she is overdue for eye exam. Plans to call and get one scheduled in the next couple of months. Encouraged the patient to have yearly exams. Does check her feet daily for scraps, scratches, cuts, sores. Review of keeping feet clean and dry.       The patient states she needs to make an appointment to get her eyes checked. Education and support given.   Symptom Management: Take medications as prescribed   Attend all scheduled provider appointments Call provider office for new concerns or questions  call the Suicide and Crisis Lifeline: 988 call the [place] National Suicide Prevention Lifeline: 615-163-0240 or TTY: 218 361 1831 TTY 325-178-4186) to talk to a trained counselor call 1-800-273-TALK (toll free, 24 hour hotline) if experiencing a Mental Health or Behavioral Health Crisis  schedule appointment with eye doctor check feet daily for cuts, sores or redness trim toenails straight across manage portion size wash and dry feet carefully every day wear comfortable, cotton socks wear comfortable, well-fitting shoes  Follow Up Plan: Telephone follow up appointment with care management team member scheduled for: 06-25-2023 at 0900 am       CCM Expected Outcome:  Monitor, Self-Manage, and Reduce Symptoms of Hypertension     Current Barriers:  Knowledge Deficits related to the need to have normalized blood pressures to prevent the risk of heart attack or stroke Chronic Disease Management support and education needs related to effective management of HTN BP Readings from Last 3 Encounters:  12/13/22 106/62  11/08/22 116/68  10/31/22 114/76     Planned Interventions: Evaluation of current treatment plan related to hypertension self management and patient's adherence to plan as established by provider. The patient states her blood pressures are stable. Does not take at home. States that this is usually good for her. Knows when to call the provider for changes. Will  continue to monitor.;   Provided education to patient re: stroke prevention, s/s of heart attack and stroke. Praised the patient for continued smoking cessation; Reviewed prescribed diet heart healthy/ADA diet. Education and review. She usually eats her main meal at supper time. States that she eats a lot of salads recently. She denies any issues with heart healthy/ADA diet. Reviewed medications with patient and discussed importance of compliance. Is compliant with medications. Works with the pharm D on a regular basis. Denies any issues with medication compliance;  Counseled on the importance of exercise goals with target of 150 minutes per week. The patient is mowing her yard and actually doing well. She paces her activity. Denies any new issues with activity intolerance  Discussed plans with patient for ongoing care management follow up and provided patient with direct contact information for care management team; Advised patient, providing education and rationale, to monitor blood pressure daily and record, calling PCP for findings outside established parameters. The patient states she checks her blood pressures when needed. States  they are WNL. Denies any acute findings related to blood pressures ;  Reviewed scheduled/upcoming provider appointments including: Saw pcp 12-13-2022. No changes in the plan of care. Knows to call for new concerns or needs Advised patient to discuss changes in her HTN and heart health  with provider; Provided education on prescribed diet heart healthy/ADA. Education and support given. Review of healthy eating options ;  Discussed complications of poorly controlled blood pressure such as heart disease, stroke, circulatory complications, vision complications, kidney impairment, sexual dysfunction;  Screening for signs and symptoms of depression related to chronic disease state;  Assessed social determinant of health barriers;  Review of respiratory conditions. The patient  denies any exacerbations with her breathing. States that she feels fine and saw the pulmonary provider in February. She stopped smoking in February and continues not to smoke. Praised for Surveyor, minerals. The patient is enjoying the warmer weather and likes getting out and working in the yard. Denies any changes in her breathing.  Symptom Management: Take medications as prescribed   Attend all scheduled provider appointments Call provider office for new concerns or questions  call the Suicide and Crisis Lifeline: 988 call the [place] National Suicide Prevention Lifeline: 856-350-0461 or TTY: 779-873-8401 TTY 430 527 1406) to talk to a trained counselor call 1-800-273-TALK (toll free, 24 hour hotline) if experiencing a Mental Health or Behavioral Health Crisis  check blood pressure weekly learn about high blood pressure keep a blood pressure log take blood pressure log to all doctor appointments call doctor for signs and symptoms of high blood pressure develop an action plan for high blood pressure keep all doctor appointments take medications for blood pressure exactly as prescribed report new symptoms to your doctor  Follow Up Plan: Telephone follow up appointment with care management team member scheduled for: 06-25-2023 at 0900 am       Patient Stated     05/19/2023, keeping A1C under control     Pharmacy Goals     Our goal A1c is less than 7%. This corresponds with fasting sugars less than 130 and 2 hour after meal sugars less than 180. Please keep a log of your results when checking your blood sugar   Our goal bad cholesterol, or LDL, is less than 70 . This is why it is important to continue taking your simvastatin.  Please have your medications and blood sugar log with you for our next telephone call.  Estelle Grumbles, PharmD, BCACP Clinical Pharmacist [facility] [phone]         This is a list of the screening recommended for you  and due dates:  Health Maintenance  Topic Date Due   Zoster (Shingles) Vaccine (1 of 2) Never done   Eye exam for diabetics  06/05/2019   COVID-19 Vaccine (3 - Pfizer risk series) 04/08/2020   Complete foot exam   02/22/2023   Flu Shot  06/05/2023   Hemoglobin A1C  06/13/2023   Yearly kidney function blood test for diabetes  12/14/2023   Yearly kidney health urinalysis for diabetes  12/14/2023   Screening for Lung Cancer  01/14/2024   Medicare Annual Wellness Visit  05/18/2024   Mammogram  09/13/2024   Colon Cancer Screening  02/08/2026   DTaP/Tdap/Td vaccine (2 - Td or Tdap) 06/17/2027   Hepatitis C Screening  Completed   HIV Screening  Completed   HPV Vaccine  Aged Out    Advanced directives: Advance directive discussed with you today.   Conditions/risks  identified: none  Next appointment: Follow up in one year for your annual wellness visit.   Preventive Care 40-64 Years, Female Preventive care refers to lifestyle choices and visits with your health care provider that can promote health and wellness. What does preventive care include? A yearly physical exam. This is also called an annual well check. Dental exams once or twice a year. Routine eye exams. Ask your health care provider how often you should have your eyes checked. Personal lifestyle choices, including: Daily care of your teeth and gums. Regular physical activity. Eating a healthy diet. Avoiding tobacco and drug use. Limiting alcohol use. Practicing safe sex. Taking low-dose aspirin daily starting at age 44. Taking vitamin and mineral supplements as recommended by your health care provider. What happens during an annual well check? The services and screenings done by your health care provider during your annual well check will depend on your age, overall health, lifestyle risk factors, and family history of disease. Counseling  Your health care provider may ask you questions about your: Alcohol  use. Tobacco use. Drug use. Emotional well-being. Home and relationship well-being. Sexual activity. Eating habits. Work and work Astronomer. Method of birth control. Menstrual cycle. Pregnancy history. Screening  You may have the following tests or measurements: Height, weight, and BMI. Blood pressure. Lipid and cholesterol levels. These may be checked every 5 years, or more frequently if you are over 73 years old. Skin check. Lung cancer screening. You may have this screening every year starting at age 62 if you have a 30-pack-year history of smoking and currently smoke or have quit within the past 15 years. Fecal occult blood test (FOBT) of the stool. You may have this test every year starting at age 43. Flexible sigmoidoscopy or colonoscopy. You may have a sigmoidoscopy every 5 years or a colonoscopy every 10 years starting at age 54. Hepatitis C blood test. Hepatitis B blood test. Sexually transmitted disease (STD) testing. Diabetes screening. This is done by checking your blood sugar (glucose) after you have not eaten for a while (fasting). You may have this done every 1-3 years. Mammogram. This may be done every 1-2 years. Talk to your health care provider about when you should start having regular mammograms. This may depend on whether you have a family history of breast cancer. BRCA-related cancer screening. This may be done if you have a family history of breast, ovarian, tubal, or peritoneal cancers. Pelvic exam and Pap test. This may be done every 3 years starting at age 97. Starting at age 72, this may be done every 5 years if you have a Pap test in combination with an HPV test. Bone density scan. This is done to screen for osteoporosis. You may have this scan if you are at high risk for osteoporosis. Discuss your test results, treatment options, and if necessary, the need for more tests with your health care provider. Vaccines  Your health care provider may recommend  certain vaccines, such as: Influenza vaccine. This is recommended every year. Tetanus, diphtheria, and acellular pertussis (Tdap, Td) vaccine. You may need a Td booster every 10 years. Zoster vaccine. You may need this after age 77. Pneumococcal 13-valent conjugate (PCV13) vaccine. You may need this if you have certain conditions and were not previously vaccinated. Pneumococcal polysaccharide (PPSV23) vaccine. You may need one or two doses if you smoke cigarettes or if you have certain conditions. Talk to your health care provider about which screenings and vaccines you need and how often  you need them. This information is not intended to replace advice given to you by your health care provider. Make sure you discuss any questions you have with your health care provider. Document Released: 11/17/2015 Document Revised: 07/10/2016 Document Reviewed: 08/22/2015 Elsevier Interactive Patient Education  2017 [employer].    Fall Prevention in the Home Falls can cause injuries. They can happen to people of all ages. There are many things you can do to make your home safe and to help prevent falls. What can I do on the outside of my home? Regularly fix the edges of walkways and driveways and fix any cracks. Remove anything that might make you trip as you walk through a door, such as a raised step or threshold. Trim any bushes or trees on the path to your home. Use bright outdoor lighting. Clear any walking paths of anything that might make someone trip, such as rocks or tools. Regularly check to see if handrails are loose or broken. Make sure that both sides of any steps have handrails. Any raised decks and porches should have guardrails on the edges. Have any leaves, snow, or ice cleared regularly. Use sand or salt on walking paths during winter. Clean up any spills in your garage right away. This includes oil or grease spills. What can I do in the bathroom? Use night lights. Install grab bars  by the toilet and in the tub and shower. Do not use towel bars as grab bars. Use non-skid mats or decals in the tub or shower. If you need to sit down in the shower, use a plastic, non-slip stool. Keep the floor dry. Clean up any water that spills on the floor as soon as it happens. Remove soap buildup in the tub or shower regularly. Attach bath mats securely with double-sided non-slip rug tape. Do not have throw rugs and other things on the floor that can make you trip. What can I do in the bedroom? Use night lights. Make sure that you have a light by your bed that is easy to reach. Do not use any sheets or blankets that are too big for your bed. They should not hang down onto the floor. Have a firm chair that has side arms. You can use this for support while you get dressed. Do not have throw rugs and other things on the floor that can make you trip. What can I do in the kitchen? Clean up any spills right away. Avoid walking on wet floors. Keep items that you use a lot in easy-to-reach places. If you need to reach something above you, use a strong step stool that has a grab bar. Keep electrical cords out of the way. Do not use floor polish or wax that makes floors slippery. If you must use wax, use non-skid floor wax. Do not have throw rugs and other things on the floor that can make you trip. What can I do with my stairs? Do not leave any items on the stairs. Make sure that there are handrails on both sides of the stairs and use them. Fix handrails that are broken or loose. Make sure that handrails are as long as the stairways. Check any carpeting to make sure that it is firmly attached to the stairs. Fix any carpet that is loose or worn. Avoid having throw rugs at the top or bottom of the stairs. If you do have throw rugs, attach them to the floor with carpet tape. Make sure that you have a  light switch at the top of the stairs and the bottom of the stairs. If you do not have them, ask  someone to add them for you. What else can I do to help prevent falls? Wear shoes that: Do not have high heels. Have rubber bottoms. Are comfortable and fit you well. Are closed at the toe. Do not wear sandals. If you use a stepladder: Make sure that it is fully opened. Do not climb a closed stepladder. Make sure that both sides of the stepladder are locked into place. Ask someone to hold it for you, if possible. Clearly mark and make sure that you can see: Any grab bars or handrails. First and last steps. Where the edge of each step is. Use tools that help you move around (mobility aids) if they are needed. These include: Canes. Walkers. Scooters. Crutches. Turn on the lights when you go into a dark area. Replace any light bulbs as soon as they burn out. Set up your furniture so you have a clear path. Avoid moving your furniture around. If any of your floors are uneven, fix them. If there are any pets around you, be aware of where they are. Review your medicines with your doctor. Some medicines can make you feel dizzy. This can increase your chance of falling. Ask your doctor what other things that you can do to help prevent falls. This information is not intended to replace advice given to you by your health care provider. Make sure you discuss any questions you have with your health care provider. Document Released: 08/17/2009 Document Revised: 03/28/2016 Document Reviewed: 11/25/2014 Elsevier Interactive Patient Education  2017 [employer].

## 2023-05-20 NOTE — Telephone Encounter (Signed)
Requested Prescriptions  Pending Prescriptions Disp Refills   LANTUS SOLOSTAR 100 UNIT/ML Solostar Pen [Pharmacy Med Name: LANTUS SOLOSTAR PEN 5'S 100U/ML] 60 mL 3    Sig: INJECT 35 UNITS UNDER THE SKIN TWICE A DAY     Endocrinology:  Diabetes - Insulins Passed - 05/19/2023 12:21 PM      Passed - HBA1C is between 0 and 7.9 and within 180 days    Hemoglobin A1C  Date Value Ref Range Status  12/13/2022 7.6 (A) 4.0 - 5.6 % Final   Hgb A1c MFr Bld  Date Value Ref Range Status  05/31/2022 8.7 (H) <5.7 % of total Hgb Final    Comment:    For someone without known diabetes, a hemoglobin A1c value of 6.5% or greater indicates that they may have  diabetes and this should be confirmed with a follow-up  test. . For someone with known diabetes, a value <7% indicates  that their diabetes is well controlled and a value  greater than or equal to 7% indicates suboptimal  control. A1c targets should be individualized based on  duration of diabetes, age, comorbid conditions, and  other considerations. . Currently, no consensus exists regarding use of hemoglobin A1c for diagnosis of diabetes for children. Verna Czech - Valid encounter within last 6 months    Recent Outpatient Visits           5 months ago Type 2 diabetes mellitus with hyperglycemia, with long-term current use of insulin [facility])   [place] [facility] [place], Salvadore [place], NP   6 months ago Multifocal pneumonia   [place] [facility] [place], [place], NP   8 months ago Type 2 diabetes mellitus with hyperglycemia, with long-term current use of insulin [facility])   [place] [facility] [place], Salvadore Oxford, NP   11 months ago Encounter for general adult medical examination with abnormal findings   [place] [facility] [place], [place], NP   1 year ago Type 2 diabetes mellitus with hyperglycemia, with long-term current use of insulin [facility])   [place] [facility] [place], Salvadore Oxford, NP       Future Appointments             In 1 month [name], Salvadore Oxford, NP [facility], [facility]

## 2023-05-27 ENCOUNTER — Telehealth: Payer: Self-pay

## 2023-05-27 NOTE — Telephone Encounter (Signed)
Copied from CRM [id number]. Topic: General - Other >> May 27, 2023  3:06 PM Ja-Kwan M wrote: Reason for CRM: Pt requests call back to advise if the Ozempic is ready for pick up. Cb# [phone]

## 2023-05-27 NOTE — Telephone Encounter (Signed)
Patient aware samples are ready for pick up 

## 2023-06-17 DIAGNOSIS — H524 Presbyopia: Secondary | ICD-10-CM | POA: Diagnosis not present

## 2023-06-17 DIAGNOSIS — E113393 Type 2 diabetes mellitus with moderate nonproliferative diabetic retinopathy without macular edema, bilateral: Secondary | ICD-10-CM | POA: Diagnosis not present

## 2023-06-18 LAB — HM DIABETES EYE EXAM

## 2023-06-25 ENCOUNTER — Other Ambulatory Visit: Payer: Self-pay

## 2023-06-25 ENCOUNTER — Other Ambulatory Visit: Payer: Medicare Other

## 2023-06-25 ENCOUNTER — Telehealth: Payer: Medicare Other

## 2023-06-25 NOTE — Patient Outreach (Signed)
Care Management   Visit Note  06/25/2023 Name: Brittany Pruitt MRN: [id number] DOB: Jan 08, 1961  Subjective: Brittany Pruitt is a 62 y.o. year old female who is a primary care patient of [name], NP. The Care Management team was consulted for assistance.      Engaged with patient spoke with patient by telephone.    Goals Addressed             This Visit's Progress    RNCM Care Management  Expected Outcome:  Monitor, Self-Manage and Reduce Symptoms of Diabetes       Current Barriers:  Knowledge Deficits related to the importance of blood sugars in range to prevent complications from DM Care Coordination needs related to cost constraints of Mounjaro and her insurance not covering Mounjaro in a patient with DM Chronic Disease Management support and education needs related to effective management of DM Financial Constraints.  Last eye exam > 1 year ago in July  Lab Results  Component Value Date   HGBA1C 7.6 (A) 12/13/2022  Previously 8.8 in November.   Planned Interventions: Provided education to patient about basic DM disease process. Review of DM and the patient has had some fluctuations in A1C over the last several years. Review of goal of A1C of <7.0. A1C trending down. The patient will have new bloodwork the end of August. She is hoping her levels will be much lower. She feels like they will be lower.  Education provided.  Reviewed medications with patient and discussed importance of medication adherence. The patient works with the pharm D for medication adherence and education. The patient states she has been approved for assistance to get Ozempic. The patient just received her letter. The patient works with the pharm D.  She has been approved through 2024. She has her Ozempic and is taking as prescribed. Knows to call for any new needs or changes.  Reviewed prescribed diet with patient heart healthy/ADA diet. Education given. The patient states her biggest meal of the day  is Supper. She has been eating a lot of salads but states that her sugars are a little high after eating salads. Review of dressing and this may be the reason. Discussed bedtime snacks. The patient does not eat a bedtime snack. Education provided that she may want to start eating a bedtime snack as this may help with regulation of her blood sugars.  Counseled on importance of regular laboratory monitoring as prescribed. Labs are up to date. The patient states that she is happy her A1c is trending down. Review of goals    Discussed plans with patient for ongoing care management follow up and provided patient with direct contact information for care management team;      Provided patient with written educational materials related to hypo and hyperglycemia and importance of correct treatment. Review and education.       Advised patient, providing education and rationale, to check cbg twice daily and when you have symptoms of low or high blood sugar and record. The patient states her blood sugar range in am is 85 to 100, before meals 130 to 160 and after meals 160 to 185. Education on the goal of fasting <130 or less and the goal of post prandial of <180.  The patient is doing better since being back on Ozempic and mindful of what she is eating. Is happy about her readings and is sure when she has her new A1C in August it will be much better.  call provider for findings outside established parameters;       Referral made to pharmacy team for assistance with cost constraints and side effects of medications. Is working with the pharm D and has been approved for [employer]. Has been approved and is thankful for this.    Review of patient status, including review of consultants reports, relevant laboratory and other test results, and medications completed;       Advised patient to discuss changes in her DM health and well being with provider;      Screening for signs and symptoms of depression related to chronic  disease state;        Assessed social determinant of health barriers;   The patient states she is overdue for eye exam. Plans to call and get one scheduled in the next couple of months. Encouraged the patient to have yearly exams. Does check her feet daily for scraps, scratches, cuts, sores. Review of keeping feet clean and dry.       The patient states she needs to make an appointment to get her eyes checked. Education and support given.  Education provided on flu vaccine for fall 2024  Symptom Management: Take medications as prescribed   Attend all scheduled provider appointments Call provider office for new concerns or questions  call the Suicide and Crisis Lifeline: 988 call the [place] National Suicide Prevention Lifeline: 3218457254 or TTY: (564) 845-3063 TTY (930)603-1664) to talk to a trained counselor call 1-800-273-TALK (toll free, 24 hour hotline) if experiencing a Mental Health or Behavioral Health Crisis  schedule appointment with eye doctor check feet daily for cuts, sores or redness trim toenails straight across manage portion size wash and dry feet carefully every day wear comfortable, cotton socks wear comfortable, well-fitting shoes  Follow Up Plan: Telephone follow up appointment with care management team member scheduled for: 09-03-2023 at 0900 am       RNCM Care Management  Expected Outcome:  Monitor, Self-Manage, and Reduce Symptoms of Hypertension       Current Barriers:  Knowledge Deficits related to the need to have normalized blood pressures to prevent the risk of heart attack or stroke Chronic Disease Management support and education needs related to effective management of HTN BP Readings from Last 3 Encounters:  12/13/22 106/62  11/08/22 116/68  10/31/22 114/76     Planned Interventions: Evaluation of current treatment plan related to hypertension self management and patient's adherence to plan as established by provider. The patient states her blood  pressures are stable. Does not take at home. States that this is usually good for her. Knows when to call the provider for changes. Will continue to monitor.;   Provided education to patient re: stroke prevention, s/s of heart attack and stroke. Praised the patient for continued smoking cessation; Reviewed prescribed diet heart healthy/ADA diet. Education and review. She usually eats her main meal at supper time. States that she eats a lot of salads recently. She denies any issues with heart healthy/ADA diet. Reviewed medications with patient and discussed importance of compliance. Is compliant with medications. Works with the pharm D on a regular basis. Denies any issues with medication compliance;  Counseled on the importance of exercise goals with target of 150 minutes per week. The patient is mowing her yard and actually doing well. She paces her activity. Denies any new issues with activity intolerance. Enjoys being outside. States she is doing well. Denies any new concerns or needs at this time.  Discussed plans with patient  for ongoing care management follow up and provided patient with direct contact information for care management team; Advised patient, providing education and rationale, to monitor blood pressure daily and record, calling PCP for findings outside established parameters. The patient states she checks her blood pressures when needed. States they are WNL. Denies any acute findings related to blood pressures ;  Reviewed scheduled/upcoming provider appointments including: 07-04-2023 at 120 pm Advised patient to discuss changes in her HTN and heart health  with provider; Provided education on prescribed diet heart healthy/ADA. Education and support given. Review of healthy eating options ;  Discussed complications of poorly controlled blood pressure such as heart disease, stroke, circulatory complications, vision complications, kidney impairment, sexual dysfunction;  Screening for signs  and symptoms of depression related to chronic disease state;  Assessed social determinant of health barriers;  Review of respiratory conditions. The patient denies any exacerbations with her breathing. States that she feels fine and saw the pulmonary provider in February. She stopped smoking in February and continues not to smoke. Praised for [employer], minerals. The patient is enjoying the warmer weather and likes getting out and working in the yard. Denies any changes in her breathing. Review of triggers that may cause her to have shortness of breath. When she is mowing the yards she monitors for changes and paces her activity. Feels her breathing is good and only time she gets a little winded is when she is mowing the yard and pushing it.   Symptom Management: Take medications as prescribed   Attend all scheduled provider appointments Call provider office for new concerns or questions  call the Suicide and Crisis Lifeline: 988 call the [place] National Suicide Prevention Lifeline: 6620156729 or TTY: (442)731-1944 TTY (682)111-7785) to talk to a trained counselor call 1-800-273-TALK (toll free, 24 hour hotline) if experiencing a Mental Health or Behavioral Health Crisis  check blood pressure weekly learn about high blood pressure keep a blood pressure log take blood pressure log to all doctor appointments call doctor for signs and symptoms of high blood pressure develop an action plan for high blood pressure keep all doctor appointments take medications for blood pressure exactly as prescribed report new symptoms to your doctor  Follow Up Plan: Telephone follow up appointment with care management team member scheduled for: 09-03-2023 at 0900 am            Consent to Services:  Patient was given information about care management services, agreed to services, and gave verbal consent to participate.   Plan: Telephone follow up appointment with care management team member scheduled  for: 09-03-2023 at9 am   Alto Denver RN, MSN, CCM RN Care Manager  [facility]  Ambulatory Care Management  Direct Number: [phone]

## 2023-06-25 NOTE — Patient Instructions (Signed)
Visit Information  Thank you for taking time to visit with me today. Please don't hesitate to contact me if I can be of assistance to you before our next scheduled telephone appointment.  Following are the goals we discussed today:   Goals Addressed             This Visit's Progress    RNCM Care Management  Expected Outcome:  Monitor, Self-Manage and Reduce Symptoms of Diabetes       Current Barriers:  Knowledge Deficits related to the importance of blood sugars in range to prevent complications from DM Care Coordination needs related to cost constraints of Mounjaro and her insurance not covering Mounjaro in a patient with DM Chronic Disease Management support and education needs related to effective management of DM Financial Constraints.  Last eye exam > 1 year ago in July  Lab Results  Component Value Date   HGBA1C 7.6 (A) 12/13/2022  Previously 8.8 in November.   Planned Interventions: Provided education to patient about basic DM disease process. Review of DM and the patient has had some fluctuations in A1C over the last several years. Review of goal of A1C of <7.0. A1C trending down. The patient will have new bloodwork the end of August. She is hoping her levels will be much lower. She feels like they will be lower.  Education provided.  Reviewed medications with patient and discussed importance of medication adherence. The patient works with the pharm D for medication adherence and education. The patient states she has been approved for assistance to get Ozempic. The patient just received her letter. The patient works with the pharm D.  She has been approved through 2024. She has her Ozempic and is taking as prescribed. Knows to call for any new needs or changes.  Reviewed prescribed diet with patient heart healthy/ADA diet. Education given. The patient states her biggest meal of the day is Supper. She has been eating a lot of salads but states that her sugars are a little high after  eating salads. Review of dressing and this may be the reason. Discussed bedtime snacks. The patient does not eat a bedtime snack. Education provided that she may want to start eating a bedtime snack as this may help with regulation of her blood sugars.  Counseled on importance of regular laboratory monitoring as prescribed. Labs are up to date. The patient states that she is happy her A1c is trending down. Review of goals    Discussed plans with patient for ongoing care management follow up and provided patient with direct contact information for care management team;      Provided patient with written educational materials related to hypo and hyperglycemia and importance of correct treatment. Review and education.       Advised patient, providing education and rationale, to check cbg twice daily and when you have symptoms of low or high blood sugar and record. The patient states her blood sugar range in am is 85 to 100, before meals 130 to 160 and after meals 160 to 185. Education on the goal of fasting <130 or less and the goal of post prandial of <180.  The patient is doing better since being back on Ozempic and mindful of what she is eating. Is happy about her readings and is sure when she has her new A1C in August it will be much better.   call provider for findings outside established parameters;       Referral made to pharmacy team  for assistance with cost constraints and side effects of medications. Is working with the pharm D and has been approved for [employer]. Has been approved and is thankful for this.    Review of patient status, including review of consultants reports, relevant laboratory and other test results, and medications completed;       Advised patient to discuss changes in her DM health and well being with provider;      Screening for signs and symptoms of depression related to chronic disease state;        Assessed social determinant of health barriers;   The patient states she is  overdue for eye exam. Plans to call and get one scheduled in the next couple of months. Encouraged the patient to have yearly exams. Does check her feet daily for scraps, scratches, cuts, sores. Review of keeping feet clean and dry.       The patient states she needs to make an appointment to get her eyes checked. Education and support given.  Education provided on flu vaccine for fall 2024  Symptom Management: Take medications as prescribed   Attend all scheduled provider appointments Call provider office for new concerns or questions  call the Suicide and Crisis Lifeline: 988 call the [place] National Suicide Prevention Lifeline: 937-459-7136 or TTY: (573)045-0241 TTY (608)097-6972) to talk to a trained counselor call 1-800-273-TALK (toll free, 24 hour hotline) if experiencing a Mental Health or Behavioral Health Crisis  schedule appointment with eye doctor check feet daily for cuts, sores or redness trim toenails straight across manage portion size wash and dry feet carefully every day wear comfortable, cotton socks wear comfortable, well-fitting shoes  Follow Up Plan: Telephone follow up appointment with care management team member scheduled for: 09-03-2023 at 0900 am       RNCM Care Management  Expected Outcome:  Monitor, Self-Manage, and Reduce Symptoms of Hypertension       Current Barriers:  Knowledge Deficits related to the need to have normalized blood pressures to prevent the risk of heart attack or stroke Chronic Disease Management support and education needs related to effective management of HTN BP Readings from Last 3 Encounters:  12/13/22 106/62  11/08/22 116/68  10/31/22 114/76     Planned Interventions: Evaluation of current treatment plan related to hypertension self management and patient's adherence to plan as established by provider. The patient states her blood pressures are stable. Does not take at home. States that this is usually good for her. Knows when to  call the provider for changes. Will continue to monitor.;   Provided education to patient re: stroke prevention, s/s of heart attack and stroke. Praised the patient for continued smoking cessation; Reviewed prescribed diet heart healthy/ADA diet. Education and review. She usually eats her main meal at supper time. States that she eats a lot of salads recently. She denies any issues with heart healthy/ADA diet. Reviewed medications with patient and discussed importance of compliance. Is compliant with medications. Works with the pharm D on a regular basis. Denies any issues with medication compliance;  Counseled on the importance of exercise goals with target of 150 minutes per week. The patient is mowing her yard and actually doing well. She paces her activity. Denies any new issues with activity intolerance. Enjoys being outside. States she is doing well. Denies any new concerns or needs at this time.  Discussed plans with patient for ongoing care management follow up and provided patient with direct contact information for care management team; Advised  patient, providing education and rationale, to monitor blood pressure daily and record, calling PCP for findings outside established parameters. The patient states she checks her blood pressures when needed. States they are WNL. Denies any acute findings related to blood pressures ;  Reviewed scheduled/upcoming provider appointments including: 07-04-2023 at 120 pm Advised patient to discuss changes in her HTN and heart health  with provider; Provided education on prescribed diet heart healthy/ADA. Education and support given. Review of healthy eating options ;  Discussed complications of poorly controlled blood pressure such as heart disease, stroke, circulatory complications, vision complications, kidney impairment, sexual dysfunction;  Screening for signs and symptoms of depression related to chronic disease state;  Assessed social determinant of health  barriers;  Review of respiratory conditions. The patient denies any exacerbations with her breathing. States that she feels fine and saw the pulmonary provider in February. She stopped smoking in February and continues not to smoke. Praised for [employer], [employer]. The patient is enjoying the warmer weather and likes getting out and working in the yard. Denies any changes in her breathing. Review of triggers that may cause her to have shortness of breath. When she is mowing the yards she monitors for changes and paces her activity. Feels her breathing is good and only time she gets a little winded is when she is mowing the yard and pushing it.   Symptom Management: Take medications as prescribed   Attend all scheduled provider appointments Call provider office for new concerns or questions  call the Suicide and Crisis Lifeline: 988 call the [place] National Suicide Prevention Lifeline: 939-020-2000 or TTY: (218)297-0864 TTY (712)667-5279) to talk to a trained counselor call 1-800-273-TALK (toll free, 24 hour hotline) if experiencing a Mental Health or Behavioral Health Crisis  check blood pressure weekly learn about high blood pressure keep a blood pressure log take blood pressure log to all doctor appointments call doctor for signs and symptoms of high blood pressure develop an action plan for high blood pressure keep all doctor appointments take medications for blood pressure exactly as prescribed report new symptoms to your doctor  Follow Up Plan: Telephone follow up appointment with care management team member scheduled for: 09-03-2023 at 0900 am           Our next appointment is by telephone on 09-03-2023 at 9 am  Please call the care guide team at [phone] if you need to cancel or reschedule your appointment.   If you are experiencing a Mental Health or Behavioral Health Crisis or need someone to talk to, please call the Suicide and Crisis Lifeline: 988 call the [place] National  Suicide Prevention Lifeline: (769)502-5158 or TTY: 607-574-6214 TTY 251 243 5551) to talk to a trained counselor call 1-800-273-TALK (toll free, 24 hour hotline)   Patient verbalizes understanding of instructions and care plan provided today and agrees to view in MyChart. Active MyChart status and patient understanding of how to access instructions and care plan via MyChart confirmed with patient.       Alto Denver RN, MSN, CCM RN Care Manager  [facility]  Ambulatory Care Management  Direct Number: [phone]

## 2023-07-01 ENCOUNTER — Encounter: Payer: Self-pay | Admitting: Internal Medicine

## 2023-07-04 ENCOUNTER — Encounter: Payer: Self-pay | Admitting: Internal Medicine

## 2023-07-04 ENCOUNTER — Ambulatory Visit (INDEPENDENT_AMBULATORY_CARE_PROVIDER_SITE_OTHER): Payer: Medicare Other | Admitting: Internal Medicine

## 2023-07-04 VITALS — BP 118/74 | HR 61 | Temp 95.9°F | Ht 63.5 in | Wt 170.0 lb

## 2023-07-04 DIAGNOSIS — E663 Overweight: Secondary | ICD-10-CM

## 2023-07-04 DIAGNOSIS — E1142 Type 2 diabetes mellitus with diabetic polyneuropathy: Secondary | ICD-10-CM

## 2023-07-04 DIAGNOSIS — Z0001 Encounter for general adult medical examination with abnormal findings: Secondary | ICD-10-CM

## 2023-07-04 DIAGNOSIS — Z23 Encounter for immunization: Secondary | ICD-10-CM | POA: Diagnosis not present

## 2023-07-04 DIAGNOSIS — Z6829 Body mass index (BMI) 29.0-29.9, adult: Secondary | ICD-10-CM

## 2023-07-04 DIAGNOSIS — Z794 Long term (current) use of insulin: Secondary | ICD-10-CM | POA: Diagnosis not present

## 2023-07-04 DIAGNOSIS — Z1231 Encounter for screening mammogram for malignant neoplasm of breast: Secondary | ICD-10-CM

## 2023-07-04 NOTE — Assessment & Plan Note (Signed)
Encouraged diet and exercise for weight loss ?

## 2023-07-04 NOTE — Progress Notes (Signed)
Subjective:    Patient ID: Brittany Pruitt, female    DOB: [birth year], 62 y.o.   MRN: [id number]  HPI  Patient presents to clinic today for her annual exam.  Flu: 09/2022 Tetanus: 06/2017 COVID: X 2 Pneumovax: 06/2019 Prevnar: 05/2015 Shingrix: Never Pap smear: Hysterectomy Mammogram: 09/2022 Bone density: 09/2021 Colon screening: 02/2016 Vision screening: annually Dentist: as needed  Diet: She does eat some meat. She consumes fruits and veggies. She tries to avoid fried foods. She drinks mostly water, coffee. Exercise: mow the grass, work in the yard   Review of Systems     Past Medical History:  Diagnosis Date   Anxiety    Chicken pox    Depression    Diabetes mellitus without complication (HCC)    Frequent headaches    Hyperlipidemia     Current Outpatient Medications  Medication Sig Dispense Refill   acetaminophen (TYLENOL) 500 MG tablet Take 500 mg by mouth every 6 (six) hours as needed for moderate pain.     albuterol (VENTOLIN HFA) 108 (90 Base) MCG/ACT inhaler Inhale 2 puffs into the lungs every 6 (six) hours as needed for wheezing or shortness of breath. 8 g 2   busPIRone (BUSPAR) 5 MG tablet TAKE 1 TABLET TWICE A DAY 180 tablet 0   Cholecalciferol (VITAMIN D3) 50 MCG (2000 UT) capsule Take 2,000 Units by mouth daily.     citalopram (CELEXA) 20 MG tablet TAKE 1 TABLET DAILY 90 tablet 0   clonazePAM (KLONOPIN) 0.5 MG tablet TAKE ONE-HALF (1/2) TO ONE TABLET TWICE A DAY AS NEEDED FOR ANXIETY 45 tablet 0   cyanocobalamin (VITAMIN B12) 1000 MCG tablet Take 1,000 mcg by mouth daily.     gabapentin (NEURONTIN) 300 MG capsule TAKE 1 CAPSULE FOUR TIMES A DAY 360 capsule 0   glipiZIDE (GLUCOTROL) 10 MG tablet TAKE 1 TABLET TWICE A DAY BEFORE MEALS 180 tablet 0   LANTUS SOLOSTAR 100 UNIT/ML Solostar Pen INJECT 35 UNITS UNDER THE SKIN TWICE A DAY 60 mL 3   lisinopril (ZESTRIL) 5 MG tablet TAKE 1 TABLET DAILY 90 tablet 0   meclizine (ANTIVERT) 25 MG tablet TAKE 1 TABLET  THREE TIMES A DAY AS NEEDED FOR DIZZINESS 30 tablet 0   Multiple Vitamins-Minerals (CENTRUM SILVER 50+WOMEN PO) Take 1 tablet by mouth daily.     Omega-3 Fatty Acids (FISH OIL) 1000 MG CAPS Take 1 capsule by mouth in the morning and at bedtime.     omeprazole (PRILOSEC) 20 MG capsule TAKE 1 CAPSULE DAILY 90 capsule 2   Semaglutide, 1 MG/DOSE, (OZEMPIC, 1 MG/DOSE,) 4 MG/3ML SOPN Inject 1 mg into the skin once a week.     simvastatin (ZOCOR) 20 MG tablet TAKE 1 TABLET DAILY AT 6 P.M. 90 tablet 0   No current facility-administered medications for this visit.    Allergies  Allergen Reactions   Sulfa Antibiotics Hives    Family History  Problem Relation Age of Onset   Uterine cancer Mother    Breast cancer Mother 44   Pancreatic cancer Mother    Heart disease Father    Hypertension Father    Anxiety disorder Father    Diabetes Father    Uterine cancer Maternal Aunt    Breast cancer Maternal Aunt 54   Heart disease Paternal Grandfather     Social History   Socioeconomic History   Marital status: Married    Spouse name: Evolet Varriale   Number of children: Not on file   Years  of education: Not on file   Highest education level: 12th grade  Occupational History   Not on file  Tobacco Use   Smoking status: Former    Current packs/day: 0.00    Average packs/day: 1 pack/day for 30.0 years (30.0 ttl pk-yrs)    Types: Cigarettes    Quit date: 01/14/2023    Years since quitting: 0.4   Smokeless tobacco: Never  Vaping Use   Vaping status: Never Used  Substance and Sexual Activity   Alcohol use: No    Alcohol/week: 0.0 standard drinks of alcohol   Drug use: Never   Sexual activity: Yes    Partners: Male  Other Topics Concern   Not on file  Social History Narrative   Pt lives with husband Casimiro Needle.  Is not currently working.    Social Determinants of Health   Financial Resource Strain: Medium Risk (07/01/2023)   Overall Financial Resource Strain (CARDIA)    Difficulty of  Paying Living Expenses: Somewhat hard  Food Insecurity: No Food Insecurity (07/01/2023)   Hunger Vital Sign    Worried About Running Out of Food in the Last Year: Never true    Ran Out of Food in the Last Year: Never true  Transportation Needs: No Transportation Needs (07/01/2023)   PRAPARE - Administrator, Civil Service (Medical): No    Lack of Transportation (Non-Medical): No  Physical Activity: Insufficiently Active (07/01/2023)   Exercise Vital Sign    Days of Exercise per Week: 1 day    Minutes of Exercise per Session: 120 min  Stress: Stress Concern Present (07/01/2023)   [employer] of Occupational Health - Occupational Stress Questionnaire    Feeling of Stress : To some extent  Social Connections: Moderately Isolated (07/01/2023)   Social Connection and Isolation Panel [NHANES]    Frequency of Communication with Friends and Family: More than three times a week    Frequency of Social Gatherings with Friends and Family: Three times a week    Attends Religious Services: Never    Active Member of Clubs or Organizations: No    Attends Banker Meetings: Never    Marital Status: Married  Catering manager Violence: Not At Risk (05/19/2023)   Humiliation, Afraid, Rape, and Kick questionnaire    Fear of Current or Ex-Partner: No    Emotionally Abused: No    Physically Abused: No    Sexually Abused: No     Constitutional: Patient reports admit headaches.  Denies fever, malaise, fatigue, or abrupt weight changes.  HEENT: Denies eye pain, eye redness, ear pain, ringing in the ears, wax buildup, runny nose, nasal congestion, bloody nose, or sore throat. Respiratory: Denies difficulty breathing, shortness of breath, cough or sputum production.   Cardiovascular: Denies chest pain, chest tightness, palpitations or swelling in the hands or feet.  Gastrointestinal: Denies abdominal pain, bloating, constipation, diarrhea or blood in the stool.  GU: Denies urgency,  frequency, pain with urination, burning sensation, blood in urine, odor or discharge. Musculoskeletal: Denies decrease in range of motion, difficulty with gait, muscle pain or joint pain and swelling.  Skin: Denies redness, rashes, lesions or ulcercations.  Neurological: Patient reports neuropathic pain, difficulty with speech.  Denies dizziness, difficulty with memory, or problems with balance and coordination.  Psych: Patient has a history of anxiety and depression.  Denies SI/HI.  No other specific complaints in a complete review of systems (except as listed in HPI above).  Objective:   Physical Exam BP 118/74 (  BP Location: Left Arm, Patient Position: Sitting, Cuff Size: Large)   Pulse 61   Temp (!) 95.9 F (35.5 C) (Temporal)   Ht 5' 3.5" (1.613 m)   Wt 170 lb (77.1 kg)   SpO2 98%   BMI 29.64 kg/m   Wt Readings from Last 3 Encounters:  05/19/23 170 lb (77.1 kg)  12/13/22 193 lb (87.5 kg)  11/08/22 194 lb (88 kg)    General: Appears her stated age, overweight, in NAD. Skin: Warm, dry and intact.  Abrasions noted to bilateral shins. HEENT: Head: normal shape and size; Eyes: sclera white, no icterus, conjunctiva pink, PERRLA and EOMs intact;  Neck:  Neck supple, trachea midline. No masses, lumps or thyromegaly present.  Cardiovascular: Normal rate and rhythm. S1,S2 noted.  No murmur, rubs or gallops noted. No JVD or BLE edema. No carotid bruits noted. Pulmonary/Chest: Normal effort and positive vesicular breath sounds. No respiratory distress. No wheezes, rales or ronchi noted.  Abdomen: Normal bowel sounds.  Musculoskeletal: Strength 5/5 BUE/BLE.  No difficulty with gait.  Neurological: Alert and oriented. Cranial nerves II-XII grossly intact. Coordination normal.  Psychiatric: Mood and affect normal. Behavior is normal. Judgment and thought content normal.     BMET    Component Value Date/Time   NA 140 12/13/2022 1110   NA 141 03/16/2018 1537   NA 137 05/06/2012 0949    K 4.9 12/13/2022 1110   K 4.5 05/06/2012 0949   CL 103 12/13/2022 1110   CL 103 05/06/2012 0949   CO2 26 12/13/2022 1110   CO2 27 05/06/2012 0949   GLUCOSE 88 12/13/2022 1110   GLUCOSE 123 (H) 05/06/2012 0949   BUN 8 12/13/2022 1110   BUN 13 03/16/2018 1537   BUN 10 05/06/2012 0949   CREATININE 0.65 12/13/2022 1110   CALCIUM 9.6 12/13/2022 1110   CALCIUM 9.2 05/06/2012 0949   GFRNONAA >60 10/31/2022 0341   GFRNONAA >60 05/06/2012 0949   GFRAA 103 03/16/2018 1537   GFRAA >60 05/06/2012 0949    Lipid Panel     Component Value Date/Time   CHOL 129 12/13/2022 1110   CHOL 135 02/28/2017 1604   TRIG 233 (H) 12/13/2022 1110   HDL 36 (L) 12/13/2022 1110   HDL 38 (L) 02/28/2017 1604   CHOLHDL 3.6 12/13/2022 1110   VLDL 34.0 12/08/2020 1432   LDLCALC 64 12/13/2022 1110    CBC    Component Value Date/Time   WBC 11.3 (H) 12/13/2022 1110   RBC 5.09 12/13/2022 1110   HGB 15.1 12/13/2022 1110   HGB 15.6 03/16/2018 1537   HCT 45.3 (H) 12/13/2022 1110   HCT 47.5 (H) 03/16/2018 1537   PLT 286 12/13/2022 1110   PLT 314 03/16/2018 1537   MCV 89.0 12/13/2022 1110   MCV 88 03/16/2018 1537   MCV 88 05/06/2012 0949   MCH 29.7 12/13/2022 1110   MCHC 33.3 12/13/2022 1110   RDW 14.0 12/13/2022 1110   RDW 14.1 03/16/2018 1537   RDW 13.7 05/06/2012 0949   LYMPHSABS 1.7 10/31/2022 0341   LYMPHSABS 5.1 (H) 02/28/2017 1604   MONOABS 0.7 10/31/2022 0341   EOSABS 0.0 10/31/2022 0341   EOSABS 0.1 02/28/2017 1604   BASOSABS 0.0 10/31/2022 0341   BASOSABS 0.1 02/28/2017 1604    Hgb A1C Lab Results  Component Value Date   HGBA1C 7.6 (A) 12/13/2022            Assessment & Plan:   Preventative health maintenance:  Flu shot today  Tetanus UTD Encouraged her to get her COVID booster Pneumovax and Prevnar UTD Discussed Shingrix vaccine, she will check coverage with her insurance company and schedule visit if she would like to have this done She no longer needs Pap  smears Mammogram ordered-she will call to schedule Bone density UTD Colon screening UTD Encouraged her to consume a balanced diet and exercise regimen Advised her to see an eye doctor and dentist annually We will check CBC, c-Met, lipid, A1c today  RTC in 6 months, follow-up chronic conditions Nicki Reaper, NP

## 2023-07-04 NOTE — Patient Instructions (Signed)
Health Maintenance for Postmenopausal Women Menopause is a normal process in which your ability to get pregnant comes to an end. This process happens slowly over many months or years, usually between the ages of 48 and 55. Menopause is complete when you have missed your menstrual period for 12 months. It is important to talk with your health care provider about some of the most common conditions that affect women after menopause (postmenopausal women). These include heart disease, cancer, and bone loss (osteoporosis). Adopting a healthy lifestyle and getting preventive care can help to promote your health and wellness. The actions you take can also lower your chances of developing some of these common conditions. What are the signs and symptoms of menopause? During menopause, you may have the following symptoms: Hot flashes. These can be moderate or severe. Night sweats. Decrease in sex drive. Mood swings. Headaches. Tiredness (fatigue). Irritability. Memory problems. Problems falling asleep or staying asleep. Talk with your health care provider about treatment options for your symptoms. Do I need hormone replacement therapy? Hormone replacement therapy is effective in treating symptoms that are caused by menopause, such as hot flashes and night sweats. Hormone replacement carries certain risks, especially as you become older. If you are thinking about using estrogen or estrogen with progestin, discuss the benefits and risks with your health care provider. How can I reduce my risk for heart disease and stroke? The risk of heart disease, heart attack, and stroke increases as you age. One of the causes may be a change in the body's hormones during menopause. This can affect how your body uses dietary fats, triglycerides, and cholesterol. Heart attack and stroke are medical emergencies. There are many things that you can do to help prevent heart disease and stroke. Watch your blood pressure High  blood pressure causes heart disease and increases the risk of stroke. This is more likely to develop in people who have high blood pressure readings or are overweight. Have your blood pressure checked: Every 3-5 years if you are 18-39 years of age. Every year if you are 40 years old or older. Eat a healthy diet  Eat a diet that includes plenty of vegetables, fruits, low-fat dairy products, and lean protein. Do not eat a lot of foods that are high in solid fats, added sugars, or sodium. Get regular exercise Get regular exercise. This is one of the most important things you can do for your health. Most adults should: Try to exercise for at least 150 minutes each week. The exercise should increase your heart rate and make you sweat (moderate-intensity exercise). Try to do strengthening exercises at least twice each week. Do these in addition to the moderate-intensity exercise. Spend less time sitting. Even light physical activity can be beneficial. Other tips Work with your health care provider to achieve or maintain a healthy weight. Do not use any products that contain nicotine or tobacco. These products include cigarettes, chewing tobacco, and vaping devices, such as e-cigarettes. If you need help quitting, ask your health care provider. Know your numbers. Ask your health care provider to check your cholesterol and your blood sugar (glucose). Continue to have your blood tested as directed by your health care provider. Do I need screening for cancer? Depending on your health history and family history, you may need to have cancer screenings at different stages of your life. This may include screening for: Breast cancer. Cervical cancer. Lung cancer. Colorectal cancer. What is my risk for osteoporosis? After menopause, you may be   at increased risk for osteoporosis. Osteoporosis is a condition in which bone destruction happens more quickly than new bone creation. To help prevent osteoporosis or  the bone fractures that can happen because of osteoporosis, you may take the following actions: If you are 19-50 years old, get at least 1,000 mg of calcium and at least 600 international units (IU) of vitamin D per day. If you are older than age 50 but younger than age 70, get at least 1,200 mg of calcium and at least 600 international units (IU) of vitamin D per day. If you are older than age 70, get at least 1,200 mg of calcium and at least 800 international units (IU) of vitamin D per day. Smoking and drinking excessive alcohol increase the risk of osteoporosis. Eat foods that are rich in calcium and vitamin D, and do weight-bearing exercises several times each week as directed by your health care provider. How does menopause affect my mental health? Depression may occur at any age, but it is more common as you become older. Common symptoms of depression include: Feeling depressed. Changes in sleep patterns. Changes in appetite or eating patterns. Feeling an overall lack of motivation or enjoyment of activities that you previously enjoyed. Frequent crying spells. Talk with your health care provider if you think that you are experiencing any of these symptoms. General instructions See your health care provider for regular wellness exams and vaccines. This may include: Scheduling regular health, dental, and eye exams. Getting and maintaining your vaccines. These include: Influenza vaccine. Get this vaccine each year before the flu season begins. Pneumonia vaccine. Shingles vaccine. Tetanus, diphtheria, and pertussis (Tdap) booster vaccine. Your health care provider may also recommend other immunizations. Tell your health care provider if you have ever been abused or do not feel safe at home. Summary Menopause is a normal process in which your ability to get pregnant comes to an end. This condition causes hot flashes, night sweats, decreased interest in sex, mood swings, headaches, or lack  of sleep. Treatment for this condition may include hormone replacement therapy. Take actions to keep yourself healthy, including exercising regularly, eating a healthy diet, watching your weight, and checking your blood pressure and blood sugar levels. Get screened for cancer and depression. Make sure that you are up to date with all your vaccines. This information is not intended to replace advice given to you by your health care provider. Make sure you discuss any questions you have with your health care provider. Document Revised: 03/12/2021 Document Reviewed: 03/12/2021 Elsevier Patient Education  2024 Elsevier Inc.  

## 2023-07-05 LAB — COMPLETE METABOLIC PANEL WITH GFR
AG Ratio: 1.8 (calc) (ref 1.0–2.5)
ALT: 11 U/L (ref 6–29)
AST: 16 U/L (ref 10–35)
Albumin: 4.1 g/dL (ref 3.6–5.1)
Alkaline phosphatase (APISO): 53 U/L (ref 37–153)
BUN: 12 mg/dL (ref 7–25)
CO2: 22 mmol/L (ref 20–32)
Calcium: 9.1 mg/dL (ref 8.6–10.4)
Chloride: 103 mmol/L (ref 98–110)
Creat: 0.8 mg/dL (ref 0.50–1.05)
Globulin: 2.3 g/dL (ref 1.9–3.7)
Glucose, Bld: 35 mg/dL — CL (ref 65–99)
Potassium: 4.6 mmol/L (ref 3.5–5.3)
Sodium: 137 mmol/L (ref 135–146)
Total Bilirubin: 0.5 mg/dL (ref 0.2–1.2)
Total Protein: 6.4 g/dL (ref 6.1–8.1)
eGFR: 83 mL/min/{1.73_m2} (ref >=60)

## 2023-07-05 LAB — HEMOGLOBIN A1C
Hgb A1c MFr Bld: 5.8 %{Hb} — ABNORMAL HIGH (ref <5.7)
Mean Plasma Glucose: 120 mg/dL
eAG (mmol/L): 6.6 mmol/L

## 2023-07-05 LAB — CBC
HCT: 43.7 % (ref 35.0–45.0)
Hemoglobin: 14.2 g/dL (ref 11.7–15.5)
MCH: 28.7 pg (ref 27.0–33.0)
MCHC: 32.5 g/dL (ref 32.0–36.0)
MCV: 88.3 fL (ref 80.0–100.0)
MPV: 11 fL (ref 7.5–12.5)
Platelets: 304 10*3/uL (ref 140–400)
RBC: 4.95 10*6/uL (ref 3.80–5.10)
RDW: 13.3 % (ref 11.0–15.0)
WBC: 12.5 10*3/uL — ABNORMAL HIGH (ref 3.8–10.8)

## 2023-07-05 LAB — LIPID PANEL
Cholesterol: 125 mg/dL (ref <200)
HDL: 38 mg/dL — ABNORMAL LOW (ref >=50)
LDL Cholesterol (Calc): 67 mg/dL
Non-HDL Cholesterol (Calc): 87 mg/dL (ref <130)
Total CHOL/HDL Ratio: 3.3 (calc) (ref <5.0)
Triglycerides: 113 mg/dL (ref <150)

## 2023-07-11 ENCOUNTER — Other Ambulatory Visit: Payer: Self-pay | Admitting: Family Medicine

## 2023-07-11 ENCOUNTER — Ambulatory Visit: Payer: Medicare Other | Admitting: Pharmacist

## 2023-07-11 DIAGNOSIS — Z794 Long term (current) use of insulin: Secondary | ICD-10-CM

## 2023-07-11 MED ORDER — GLIPIZIDE 5 MG PO TABS
ORAL_TABLET | ORAL | 1 refills | Status: DC
Start: 2023-07-11 — End: 2024-05-20

## 2023-07-11 NOTE — Progress Notes (Signed)
07/11/2023 Name: Brittany Pruitt MRN: [id number] DOB: 1961/08/25  Chief Complaint  Patient presents with   Medication Management   Medication Assistance   Medication Adherence    Brittany Pruitt is a 62 y.o. year old female who presented for a telephone visit.   They were referred to the pharmacist by their PCP for assistance in managing diabetes and medication access.    Subjective:  Care Team: Primary Care Provider: [name], NP ; Next Scheduled Visit: 01/02/2024 Nurse Care Manager: Carma Lair, RN; Next Scheduled Visit: 09/03/2023  Medication Access/Adherence  Current Pharmacy:  [facility] - [place], [place] - [address] [place] [address] Phone: [phone] Fax: [phone]  [id number] - [place], [place] - [address] AT [facility] OF [address] [place] [address] Phone: [phone] Fax: [phone]   Patient reports affordability concerns with their medications: No  Patient reports access/transportation concerns to their pharmacy: No  Patient reports adherence concerns with their medications:  Yes, admits over past months started taking full tablet of glipizide 10 mg  twice daily before meals, rather than previous glipizide 10 mg - 1/2 tablet (5 mg) twice daily before meals as got tired of splitting the tablet   Diabetes:  Current medications:  - Lantus 35 units twice daily - Ozempic 1 mg weekly (reports tolerating well  Reports tolerating well  Reports has noticed improvement in appetite control/ability to limit portion sizes since on Ozempic - Recently taking full tablet of glipizide 10 mg  twice daily before meals, rather than previous glipizide 10 mg - 1/2 tablet (5 mg) twice daily before meals as got tired of splitting the tablet  Medications tried in the past: metformin (GI side effects)  Recalls current glucose readings ranging:  Morning  fasting: 85-100 After supper: 150-160   Admits to having recent episodes of feeling shaky with reading ~70 as well as had more significant hypoglycemia at recent office visit with PCP on 8/30. Note per lab from 8/30, glucose was 35 at visit.  - Patient reports treated low glucose on 8/30 with glucose tablets and then had a meal - In addition to higher dose of glipizide, patient also attributes this low reading to skipping breakfast that morning in anticipation of having lab work with office visit.  Current physical activity: reports active walking and gardening most days/week  Reports working on weight loss Reports improvement in appetite control with Ozempic Current weight ~170 lbs  Statin: simvastatin 20 mg daily  Current medication access support: enrolled in patient assistance program for Ozempic from [employer] through 11/04/2023 - Reports currently has >3 month supply of Ozempic remaining   Objective:  Lab Results  Component Value Date   HGBA1C 5.8 (H) 07/04/2023    Lab Results  Component Value Date   CREATININE 0.80 07/04/2023   BUN 12 07/04/2023   NA 137 07/04/2023   K 4.6 07/04/2023   CL 103 07/04/2023   CO2 22 07/04/2023    Lab Results  Component Value Date   CHOL 125 07/04/2023   HDL 38 (L) 07/04/2023   LDLCALC 67 07/04/2023   TRIG 113 07/04/2023   CHOLHDL 3.3 07/04/2023    Medications Reviewed Today     Reviewed by Manuela Neptune, RPH-CPP (Pharmacist) on 07/11/23 at 0944  Med List Status: <None>   Medication Order Taking? Sig Documenting Provider Last Dose Status Informant  acetaminophen (  TYLENOL) 500 MG tablet [id number]  Take 500 mg by mouth every 6 (six) hours as needed for moderate pain. [provider]  Active Self  albuterol (VENTOLIN HFA) 108 (90 Base) MCG/ACT inhaler [id number]  Inhale 2 puffs into the lungs every 6 (six) hours as needed for wheezing or shortness of breath. [place], Alvira Philips, DO  Active   busPIRone (BUSPAR) 5 MG  tablet [id number]  TAKE 1 TABLET TWICE A DAY [name], Salvadore Oxford, NP  Active   Cholecalciferol (VITAMIN D3) 50 MCG (2000 UT) capsule [id number]  Take 2,000 Units by mouth daily. [provider]  Active Self  citalopram (CELEXA) 20 MG tablet [id number]  TAKE 1 TABLET DAILY [name], Salvadore Oxford, NP  Active   clonazePAM (KLONOPIN) 0.5 MG tablet [id number]  TAKE ONE-HALF (1/2) TO ONE TABLET TWICE A DAY AS NEEDED FOR ANXIETY [name], Salvadore Oxford, NP  Active   cyanocobalamin (VITAMIN B12) 1000 MCG tablet [id number]  Take 1,000 mcg by mouth daily. [provider]  Active Self  gabapentin (NEURONTIN) 300 MG capsule [id number]  TAKE 1 CAPSULE FOUR TIMES A DAY [name], Salvadore Oxford, NP  Active   glipiZIDE (GLUCOTROL) 10 MG tablet [id number]  TAKE 1 TABLET TWICE A DAY BEFORE MEALS [name], NP  Active   LANTUS SOLOSTAR 100 UNIT/ML Solostar Pen [id number] Yes INJECT 35 UNITS UNDER THE SKIN TWICE A DAY [name], NP Taking Active   lisinopril (ZESTRIL) 5 MG tablet [id number]  TAKE 1 TABLET DAILY [name], NP  Active   meclizine (ANTIVERT) 25 MG tablet [id number]  TAKE 1 TABLET THREE TIMES A DAY AS NEEDED FOR DIZZINESS [name], NP  Active   Multiple Vitamins-Minerals (CENTRUM SILVER 50+WOMEN PO) [id number]  Take 1 tablet by mouth daily. [provider]  Active Self  Omega-3 Fatty Acids (FISH OIL) 1000 MG CAPS [id number]  Take 1 capsule by mouth in the morning and at bedtime. [provider]  Active Self  omeprazole (PRILOSEC) 20 MG capsule [id number]  TAKE 1 CAPSULE DAILY [name], Salvadore Oxford, NP  Active   Semaglutide, 1 MG/DOSE, (OZEMPIC, 1 MG/DOSE,) 4 MG/3ML SOPN [id number] Yes Inject 1 mg into the skin once a week. [provider] Taking Active   simvastatin (ZOCOR) 20 MG tablet [id number]  TAKE 1 TABLET DAILY AT 6 P.M. [name], NP  Active               Assessment/Plan:   Congratulate patient on continued smoking cessation  Diabetes: - Reviewed goal A1c,  goal fasting, and goal 2 hour post prandial glucose - Reviewed dietary modifications including Encourage patient to have regular well-balanced meals and snacks spread throughout the day, while controlling carbohydrate portion sizes  Advise patient against skipping meals - Will collaborate with PCP regarding recommendation to have patient reduce glipizide dose back to 5 mg twice daily before meals (30 minutes before breakfast and supper) Will ask provider to send new Rx for glipizide 5 mg tablet strength prescription to pharmacy for patient if she agrees with this change so that patient will not need to split tablet - Review counseling with patient on s/s of low blood sugar and how to treat lows Review rules of 15s - importance of using 15 grams of sugar to treat low, recheck blood sugar in 15 minutes, treat again if remains low or, if back to normal, having meal if mealtime or snack  Review examples of sources of 15 grams of sugar Encourage patient to  continue to carry glucose tablets with her - Counsel patient to continue to monitor home blood sugar, keep log of results, have this record to review during upcoming appointments and to contact office sooner if needed for reading outside of established parameters or for symptoms such as any episodes of hypoglycemia    Follow Up Plan: Clinical Pharmacist will follow up with patient by telephone on 08/25/2023 at 10:30 AM   Estelle Grumbles, PharmD, [facility] Clinical Pharmacist [facility] [phone]

## 2023-07-11 NOTE — Patient Instructions (Signed)
Goals Addressed             This Visit's Progress    Pharmacy Goals       Our goal A1c is less than 7%. This corresponds with fasting sugars less than 130 and 2 hour after meal sugars less than 180. Please keep a log of your results when checking your blood sugar   Our goal bad cholesterol, or LDL, is less than 70 . This is why it is important to continue taking your simvastatin.  Please have your medications and blood sugar log with you for our next telephone call.   Wallace Cullens, PharmD, [place] [phone]

## 2023-07-14 ENCOUNTER — Other Ambulatory Visit: Payer: Self-pay | Admitting: Internal Medicine

## 2023-07-18 ENCOUNTER — Encounter: Payer: Self-pay | Admitting: Internal Medicine

## 2023-07-18 ENCOUNTER — Ambulatory Visit: Payer: Self-pay

## 2023-07-18 ENCOUNTER — Telehealth (INDEPENDENT_AMBULATORY_CARE_PROVIDER_SITE_OTHER): Payer: Medicare Other | Admitting: Internal Medicine

## 2023-07-18 DIAGNOSIS — J432 Centrilobular emphysema: Secondary | ICD-10-CM | POA: Diagnosis not present

## 2023-07-18 DIAGNOSIS — U071 COVID-19: Secondary | ICD-10-CM

## 2023-07-18 MED ORDER — NIRMATRELVIR/RITONAVIR (PAXLOVID)TABLET
3.0000 | ORAL_TABLET | Freq: Two times a day (BID) | ORAL | 0 refills | Status: AC
Start: 2023-07-18 — End: 2023-07-23

## 2023-07-18 MED ORDER — PREDNISONE 20 MG PO TABS
20.0000 mg | ORAL_TABLET | Freq: Every day | ORAL | 0 refills | Status: DC
Start: 2023-07-18 — End: 2023-08-25

## 2023-07-18 NOTE — Telephone Encounter (Signed)
Virtual visit today at [employer],   -Vernona Rieger

## 2023-07-18 NOTE — Progress Notes (Signed)
Virtual Visit via Video Note  I connected with Brittany Pruitt on 07/18/23 at  4:00 PM EDT by a video enabled telemedicine application and verified that I am speaking with the correct person using two identifiers.  Location: Patient: Home Provider: Office   I discussed the limitations of evaluation and management by telemedicine and the availability of in person appointments. The patient expressed understanding and agreed to proceed.  History of Present Illness:  Patient reports fatigue, headache, runny nose, scratchy throat, cough and shortness of breath.  This started 2 days ago. She is blowing clear mucous out of her nose. She denies difficulty swallowing. The cough is mostly nonproductive. She denies nasal congestion, ear pain, nausea, vomiting or diarrhea.  She has had fever, chills and bodyaches.  She has had sick contacts with similar symptoms. She has a history of COPD, hospitalized 10/2022 for the same.    Past Medical History:  Diagnosis Date   Anxiety    Chicken pox    Depression    Diabetes mellitus without complication (HCC)    Frequent headaches    Hyperlipidemia     Current Outpatient Medications  Medication Sig Dispense Refill   acetaminophen (TYLENOL) 500 MG tablet Take 500 mg by mouth every 6 (six) hours as needed for moderate pain.     albuterol (VENTOLIN HFA) 108 (90 Base) MCG/ACT inhaler Inhale 2 puffs into the lungs every 6 (six) hours as needed for wheezing or shortness of breath. 8 g 2   busPIRone (BUSPAR) 5 MG tablet TAKE 1 TABLET TWICE A DAY 180 tablet 0   Cholecalciferol (VITAMIN D3) 50 MCG (2000 UT) capsule Take 2,000 Units by mouth daily.     citalopram (CELEXA) 20 MG tablet TAKE 1 TABLET DAILY 90 tablet 0   clonazePAM (KLONOPIN) 0.5 MG tablet TAKE ONE-HALF (1/2) TO ONE TABLET TWICE A DAY AS NEEDED FOR ANXIETY 45 tablet 0   cyanocobalamin (VITAMIN B12) 1000 MCG tablet Take 1,000 mcg by mouth daily.     gabapentin (NEURONTIN) 300 MG capsule TAKE 1  CAPSULE FOUR TIMES A DAY 360 capsule 0   glipiZIDE (GLUCOTROL) 5 MG tablet TAKE 1 TABLET TWICE A DAY BEFORE MEALS 180 tablet 1   LANTUS SOLOSTAR 100 UNIT/ML Solostar Pruitt INJECT 35 UNITS UNDER THE SKIN TWICE A DAY 60 mL 3   lisinopril (ZESTRIL) 5 MG tablet TAKE 1 TABLET DAILY 90 tablet 0   meclizine (ANTIVERT) 25 MG tablet TAKE 1 TABLET THREE TIMES A DAY AS NEEDED FOR DIZZINESS 30 tablet 0   Multiple Vitamins-Minerals (CENTRUM SILVER 50+WOMEN PO) Take 1 tablet by mouth daily.     Omega-3 Fatty Acids (FISH OIL) 1000 MG CAPS Take 1 capsule by mouth in the morning and at bedtime.     omeprazole (PRILOSEC) 20 MG capsule TAKE 1 CAPSULE DAILY 90 capsule 2   Semaglutide, 1 MG/DOSE, (OZEMPIC, 1 MG/DOSE,) 4 MG/3ML SOPN Inject 1 mg into the skin once a week.     simvastatin (ZOCOR) 20 MG tablet TAKE 1 TABLET DAILY AT 6 P.M. 90 tablet 0   No current facility-administered medications for this visit.    Allergies  Allergen Reactions   Sulfa Antibiotics Hives    Family History  Problem Relation Age of Onset   Uterine cancer Mother    Breast cancer Mother 40   Pancreatic cancer Mother    Heart disease Father    Hypertension Father    Anxiety disorder Father    Diabetes Father    Uterine  cancer Maternal Aunt    Breast cancer Maternal Aunt 60   Heart disease Paternal Grandfather     Social History   Socioeconomic History   Marital status: Married    Spouse name: Zeida Ratkovich   Number of children: Not on file   Years of education: Not on file   Highest education level: 12th grade  Occupational History   Not on file  Tobacco Use   Smoking status: Former    Current packs/day: 0.00    Average packs/day: 1 pack/day for 30.0 years (30.0 ttl pk-yrs)    Types: Cigarettes    Quit date: 01/14/2023    Years since quitting: 0.5   Smokeless tobacco: Never  Vaping Use   Vaping status: Never Used  Substance and Sexual Activity   Alcohol use: No    Alcohol/week: 0.0 standard drinks of alcohol    Drug use: Never   Sexual activity: Yes    Partners: Male  Other Topics Concern   Not on file  Social History Narrative   Pt lives with husband Casimiro Needle.  Is not currently working.    Social Determinants of Health   Financial Resource Strain: Medium Risk (07/01/2023)   Overall Financial Resource Strain (CARDIA)    Difficulty of Paying Living Expenses: Somewhat hard  Food Insecurity: No Food Insecurity (07/01/2023)   Hunger Vital Sign    Worried About Running Out of Food in the Last Year: Never true    Ran Out of Food in the Last Year: Never true  Transportation Needs: No Transportation Needs (07/01/2023)   PRAPARE - Administrator, Civil Service (Medical): No    Lack of Transportation (Non-Medical): No  Physical Activity: Insufficiently Active (07/01/2023)   Exercise Vital Sign    Days of Exercise per Week: 1 day    Minutes of Exercise per Session: 120 min  Stress: Stress Concern Present (07/01/2023)   [employer] of Occupational Health - Occupational Stress Questionnaire    Feeling of Stress : To some extent  Social Connections: Moderately Isolated (07/01/2023)   Social Connection and Isolation Panel [NHANES]    Frequency of Communication with Friends and Family: More than three times a week    Frequency of Social Gatherings with Friends and Family: Three times a week    Attends Religious Services: Never    Active Member of Clubs or Organizations: No    Attends Banker Meetings: Never    Marital Status: Married  Catering manager Violence: Not At Risk (05/19/2023)   Humiliation, Afraid, Rape, and Kick questionnaire    Fear of Current or Ex-Partner: No    Emotionally Abused: No    Physically Abused: No    Sexually Abused: No     Constitutional: Patient reports fatigue, headache.  Denies fever, malaise, or abrupt weight changes.  HEENT: Patient reports runny nose and scratchy throat.  Denies eye pain, eye redness, ear pain, ringing in the ears, wax  buildup, nasal congestion, bloody nose. Respiratory: Patient reports cough and shortness of breath with exertion.  Denies difficulty breathing.   Cardiovascular: Denies chest pain, chest tightness, palpitations or swelling in the hands or feet.  Gastrointestinal: Denies abdominal pain, bloating, constipation, diarrhea or blood in the stool.  Musculoskeletal: Patient reports body aches.  Denies decrease in range of motion, difficulty with gait, or joint swelling.  Skin: Denies redness, rashes, lesions or ulcercations.    No other specific complaints in a complete review of systems (except as listed in HPI  above).  Observations/Objective:  There were no vitals taken for this visit. Wt Readings from Last 3 Encounters:  07/04/23 170 lb (77.1 kg)  05/19/23 170 lb (77.1 kg)  12/13/22 193 lb (87.5 kg)    General: Appears her stated age, appears unwell but in NAD. HEENT:  Nose: No congestion noted; Throat/Mouth: Hoarseness noted Pulmonary/Chest: Normal effort. No respiratory distress.  Neurological: Alert and oriented.   BMET    Component Value Date/Time   NA 137 07/04/2023 1325   NA 141 03/16/2018 1537   NA 137 05/06/2012 0949   K 4.6 07/04/2023 1325   K 4.5 05/06/2012 0949   CL 103 07/04/2023 1325   CL 103 05/06/2012 0949   CO2 22 07/04/2023 1325   CO2 27 05/06/2012 0949   GLUCOSE 35 (LL) 07/04/2023 1325   GLUCOSE 123 (H) 05/06/2012 0949   BUN 12 07/04/2023 1325   BUN 13 03/16/2018 1537   BUN 10 05/06/2012 0949   CREATININE 0.80 07/04/2023 1325   CALCIUM 9.1 07/04/2023 1325   CALCIUM 9.2 05/06/2012 0949   GFRNONAA >60 10/31/2022 0341   GFRNONAA >60 05/06/2012 0949   GFRAA 103 03/16/2018 1537   GFRAA >60 05/06/2012 0949    Lipid Panel     Component Value Date/Time   CHOL 125 07/04/2023 1325   CHOL 135 02/28/2017 1604   TRIG 113 07/04/2023 1325   HDL 38 (L) 07/04/2023 1325   HDL 38 (L) 02/28/2017 1604   CHOLHDL 3.3 07/04/2023 1325   VLDL 34.0 12/08/2020 1432    LDLCALC 67 07/04/2023 1325    CBC    Component Value Date/Time   WBC 12.5 (H) 07/04/2023 1325   RBC 4.95 07/04/2023 1325   HGB 14.2 07/04/2023 1325   HGB 15.6 03/16/2018 1537   HCT 43.7 07/04/2023 1325   HCT 47.5 (H) 03/16/2018 1537   PLT 304 07/04/2023 1325   PLT 314 03/16/2018 1537   MCV 88.3 07/04/2023 1325   MCV 88 03/16/2018 1537   MCV 88 05/06/2012 0949   MCH 28.7 07/04/2023 1325   MCHC 32.5 07/04/2023 1325   RDW 13.3 07/04/2023 1325   RDW 14.1 03/16/2018 1537   RDW 13.7 05/06/2012 0949   LYMPHSABS 1.7 10/31/2022 0341   LYMPHSABS 5.1 (H) 02/28/2017 1604   MONOABS 0.7 10/31/2022 0341   EOSABS 0.0 10/31/2022 0341   EOSABS 0.1 02/28/2017 1604   BASOSABS 0.0 10/31/2022 0341   BASOSABS 0.1 02/28/2017 1604    Hgb A1C Lab Results  Component Value Date   HGBA1C 5.8 (H) 07/04/2023        Assessment and Plan:  COVID-19:  Positive home COVID test Encourage rest and fluids Recommend Zyrtec and Flonase OTC for symptom management Rx for Paxlovid 3 tabs twice daily x 5 days Rx for prednisone 20 mg daily x 5 days for symptom management, advised her to monitor sugars She declines Rx for cough suppressant or inhaler at this time Recommend Delsym or Robitussin OTC as needed for cough Urgent care precautions discussed     Follow Up Instructions:    I discussed the assessment and treatment plan with the patient. The patient was provided an opportunity to ask questions and all were answered. The patient agreed with the plan and demonstrated an understanding of the instructions.   The patient was advised to call back or seek an in-person evaluation if the symptoms worsen or if the condition fails to improve as anticipated.    Nicki Reaper, NP

## 2023-07-18 NOTE — Telephone Encounter (Signed)
Chief Complaint: COVID positive Symptoms: Headache, scratchy throat, a little SOB w/exertion, fever 101.6 Frequency: Symptom onset Wednesday Pertinent Negatives: Patient denies other symptoms Disposition: [] ED /[] Urgent Care (no appt availability in office) / [x] Appointment(In office/virtual)/ []  [facility]/ [] Home Care/ [] Refused Recommended Disposition /[] [facility]/ []  Follow-up with PCP Additional Notes: Patient says she was around her daughter and granddaughter who tested positive on Tuesday, her husband and her did home test today and both are positive. She says she was told by Rene Kocher to call her directly if this happens again, because the last time she did a virtual visit with a provider who didn't know her and ended up in the hospital with pneumonia because nothing was prescribed. She says she doesn't want to end up there again, so she's calling to let Rene Kocher know. Advised first available on MyChart VV is on Monday, called the office and no answer. Advised patient I will send this to the office for a call back with an appointment, if available. After hanging up with the patient, spoke to [place] who says patient will be scheduled at 1600 today and she will call the patient.     Summary: covid   Pt states that she tested positive for covid and is wanting to see if there is anything her PCP can call in for her. Symptoms: headache, runny nose, scratchy throat. Please advise.    [id number] [place], [place] - [address] Phone: [phone] Fax: [phone]     Reason for Disposition  MILD difficulty breathing (e.g., minimal/no SOB at rest, SOB with walking, pulse <100)  Answer Assessment - Initial Assessment Questions 1. COVID-19 DIAGNOSIS: "How do you know that you have COVID?" (e.g., positive lab test or self-test, diagnosed by doctor or NP/PA, symptoms after exposure).     Home test 2. COVID-19  EXPOSURE: "Was there any known exposure to COVID before the symptoms began?" CDC Definition of close contact: within 6 feet (2 meters) for a total of 15 minutes or more over a 24-hour period.      Yes, daughter, granddaughter and husband 3. ONSET: "When did the COVID-19 symptoms start?"      Wednesday 4. WORST SYMPTOM: "What is your worst symptom?" (e.g., cough, fever, shortness of breath, muscle aches)     Headache and scratchy throat 5. COUGH: "Do you have a cough?" If Yes, ask: "How bad is the cough?"       Slight 6. FEVER: "Do you have a fever?" If Yes, ask: "What is your temperature, how was it measured, and when did it start?"     Yes 101.6 this morning (tylenol for fever) 7. RESPIRATORY STATUS: "Describe your breathing?" (e.g., normal; shortness of breath, wheezing, unable to speak)      A little SOB when walking around 8. BETTER-SAME-WORSE: "Are you getting better, staying the same or getting worse compared to yesterday?"  If getting worse, ask, "In what way?"     Worse 9. OTHER SYMPTOMS: "Do you have any other symptoms?"  (e.g., chills, fatigue, headache, loss of smell or taste, muscle pain, sore throat)     Body aches, fatigue 10. HIGH RISK DISEASE: "Do you have any chronic medical problems?" (e.g., asthma, heart or lung disease, weak immune system, obesity, etc.)       COPD 11. O2 SATURATION MONITOR:  "Do you use an oxygen saturation monitor (pulse oximeter) at home?" If Yes, ask "What  is your reading (oxygen level) today?" "What is your usual oxygen saturation reading?" (e.g., 95%)       No  Protocols used: Coronavirus (COVID-19) Diagnosed or Suspected-A-AH

## 2023-07-18 NOTE — Telephone Encounter (Signed)
Pt had a virtual visit at 1pm today.   Thanks,   -Vernona Rieger

## 2023-07-18 NOTE — Patient Instructions (Signed)

## 2023-08-18 ENCOUNTER — Other Ambulatory Visit: Payer: Self-pay | Admitting: Internal Medicine

## 2023-08-18 DIAGNOSIS — F32A Depression, unspecified: Secondary | ICD-10-CM

## 2023-08-18 DIAGNOSIS — F419 Anxiety disorder, unspecified: Secondary | ICD-10-CM

## 2023-08-19 NOTE — Telephone Encounter (Signed)
Requested Prescriptions  Pending Prescriptions Disp Refills   lisinopril (ZESTRIL) 5 MG tablet [Pharmacy Med Name: LISINOPRIL TABS 5MG ] 90 tablet 0    Sig: TAKE 1 TABLET DAILY     Cardiovascular:  ACE Inhibitors Passed - 08/18/2023 10:05 AM      Passed - Cr in normal range and within 180 days    Creat  Date Value Ref Range Status  07/04/2023 0.80 0.50 - 1.05 mg/dL Final   Creatinine, Urine  Date Value Ref Range Status  12/13/2022 110 20 - 275 mg/dL Final         Passed - K in normal range and within 180 days    Potassium  Date Value Ref Range Status  07/04/2023 4.6 3.5 - 5.3 mmol/L Final  05/06/2012 4.5 3.5 - 5.1 mmol/L Final         Passed - Patient is not pregnant      Passed - Last BP in normal range    BP Readings from Last 1 Encounters:  07/04/23 118/74         Passed - Valid encounter within last 6 months    Recent Outpatient Visits           1 month ago COVID-19   [facility] [place], [place] W, NP   1 month ago Type 2 diabetes mellitus with diabetic polyneuropathy, with long-term current use of insulin (HCC)   [facility], Gentry Fitz A, RPH-CPP   1 month ago Encounter for general adult medical examination with abnormal findings   [place] [place], [place] W, NP   8 months ago Type 2 diabetes mellitus with hyperglycemia, with long-term current use of insulin [facility])   [place] [facility] [place], Salvadore Oxford, NP   9 months ago Multifocal pneumonia   [facility] [place], Salvadore Oxford, NP       Future Appointments             In 4 months [name], Salvadore Oxford, NP [place] [facility]             citalopram (CELEXA) 20 MG tablet [Pharmacy Med Name: CITALOPRAM HYDROBROMIDE TABS 20MG ] 90 tablet 0    Sig: TAKE 1 TABLET DAILY     Psychiatry:  Antidepressants - SSRI Passed - 08/18/2023 10:05 AM      Passed - Completed  PHQ-2 or PHQ-9 in the last 360 days      Passed - Valid encounter within last 6 months    Recent Outpatient Visits           1 month ago COVID-19   [facility] [place], [place] W, NP   1 month ago Type 2 diabetes mellitus with diabetic polyneuropathy, with long-term current use of insulin (HCC)   [place] [facility], Gentry Fitz A, RPH-CPP   1 month ago Encounter for general adult medical examination with abnormal findings   [place] [facility] [place], [place] W, NP   8 months ago Type 2 diabetes mellitus with hyperglycemia, with long-term current use of insulin [facility])   [place] [facility] [place], Salvadore Oxford, NP   9 months ago Multifocal pneumonia   [place] [facility] [place], Salvadore Oxford, NP       Future Appointments  In 4 months [name], Salvadore Oxford, NP [place] [facility], [facility]             simvastatin (ZOCOR) 20 MG tablet [Pharmacy Med Name: SIMVASTATIN TABS 20MG ] 90 tablet     Sig: TAKE 1 TABLET DAILY AT 6 P.M.     Cardiovascular:  Antilipid - Statins Failed - 08/18/2023 10:05 AM      Failed - Lipid Panel in normal range within the last 12 months    Cholesterol, Total  Date Value Ref Range Status  02/28/2017 135 100 - 199 mg/dL Final   Cholesterol  Date Value Ref Range Status  07/04/2023 125 <200 mg/dL Final   LDL Cholesterol (Calc)  Date Value Ref Range Status  07/04/2023 67 mg/dL (calc) Final    Comment:    Reference range: <100 . Desirable range <100 mg/dL for primary prevention;   <70 mg/dL for patients with CHD or diabetic patients  with > or = 2 CHD risk factors. Marland Kitchen LDL-C is now calculated using the Martin-Hopkins  calculation, which is a validated novel method providing  better accuracy than the Friedewald equation in the  estimation of LDL-C.  Horald Pollen et al. Lenox Ahr. 1610;960(45): 2061-2068   (http://education.Quest[employer].com/faq/FAQ164)    HDL  Date Value Ref Range Status  07/04/2023 38 (L) > OR = 50 mg/dL Final  40/98/1191 38 (L) >39 mg/dL Final   Triglycerides  Date Value Ref Range Status  07/04/2023 113 <150 mg/dL Final         Passed - Patient is not pregnant      Passed - Valid encounter within last 12 months    Recent Outpatient Visits           1 month ago COVID-19   [facility] [place], [place] W, NP   1 month ago Type 2 diabetes mellitus with diabetic polyneuropathy, with long-term current use of insulin (HCC)   [place] D[facility], Gentry Fitz A, RPH-CPP   1 month ago Encounter for general adult medical examination with abnormal findings   [place] [facility] [place], [place] W, NP   8 months ago Type 2 diabetes mellitus with hyperglycemia, with long-term current use of insulin [facility])   [place] [facility] [place], Salvadore Oxford, NP   9 months ago Multifocal pneumonia   [place] [facility] [place], Salvadore Oxford, NP       Future Appointments             In 4 months [name], Salvadore Oxford, NP [place], [facility]             meclizine (ANTIVERT) 25 MG tablet [Pharmacy Med Name: MECLIZINE TABS 25MG ] 30 tablet     Sig: TAKE 1 TABLET THREE TIMES A DAY AS NEEDED FOR DIZZINESS     Not Delegated - Gastroenterology: Antiemetics Failed - 08/18/2023 10:05 AM      Failed - This refill cannot be delegated      Passed - Valid encounter within last 6 months    Recent Outpatient Visits           1 month ago COVID-19   [facility] [place], [place] W, NP   1 month ago Type 2 diabetes mellitus with diabetic polyneuropathy, with long-term current use of insulin [facility])   [place] [facility] D[facility], Gentry Fitz A, RPH-CPP   1 month  ago Encounter for general adult medical examination with abnormal  findings   [place] [facility] [place], [place] W, NP   8 months ago Type 2 diabetes mellitus with hyperglycemia, with long-term current use of insulin [facility])   [place] [facility] [place], Salvadore Oxford, NP   9 months ago Multifocal pneumonia   [place] [facility] [place], Salvadore Oxford, NP       Future Appointments             In 4 months [name], Salvadore Oxford, NP [facility], [facility]             busPIRone (BUSPAR) 5 MG tablet [Pharmacy Med Name: BUSPIRONE HCL TABS 5MG ] 180 tablet 0    Sig: TAKE 1 TABLET TWICE A DAY     Psychiatry: Anxiolytics/Hypnotics - Non-controlled Passed - 08/18/2023 10:05 AM      Passed - Valid encounter within last 12 months    Recent Outpatient Visits           1 month ago COVID-19   [facility] [place], [place] W, NP   1 month ago Type 2 diabetes mellitus with diabetic polyneuropathy, with long-term current use of insulin (HCC)   [place] [facility] D[facility], Gentry Fitz A, RPH-CPP   1 month ago Encounter for general adult medical examination with abnormal findings   [place] [facility] [place], [place] W, NP   8 months ago Type 2 diabetes mellitus with hyperglycemia, with long-term current use of insulin [facility])   [place] [facility] [place], Salvadore Oxford, NP   9 months ago Multifocal pneumonia   [place] [facility] [place], Salvadore Oxford, NP       Future Appointments             In 4 months [name], Salvadore Oxford, NP [place] [facility], [facility]             clonazePAM (KLONOPIN) 0.5 MG tablet [Pharmacy Med Name: CLONAZEPAM TABS 0.5MG ] 45 tablet     Sig: TAKE ONE-HALF (1/2) TO ONE TABLET TWICE A DAY AS NEEDED FOR ANXIETY     Not Delegated - Psychiatry: Anxiolytics/Hypnotics 2 Failed - 08/18/2023 10:05 AM      Failed - This refill cannot be delegated      Failed - Urine Drug Screen  completed in last 360 days      Passed - Patient is not pregnant      Passed - Valid encounter within last 6 months    Recent Outpatient Visits           1 month ago COVID-19   [facility] [place], [place], NP   1 month ago Type 2 diabetes mellitus with diabetic polyneuropathy, with long-term current use of insulin (HCC)   [place], Gentry Fitz A, RPH-CPP   1 month ago Encounter for general adult medical examination with abnormal findings   [place] [facility] [place], [place] W, NP   8 months ago Type 2 diabetes mellitus with hyperglycemia, with long-term current use of insulin [facility])   [place] [place], Salvadore Oxford, NP   9 months ago Multifocal pneumonia   [place] [facility] [place], Salvadore Oxford, NP       Future Appointments  In 4 months [name], Salvadore Oxford, NP [place] [facility] [place], [facility]

## 2023-08-19 NOTE — Telephone Encounter (Signed)
Requested medication (s) are due for refill today: yes  Requested medication (s) are on the active medication list: yes  Last refill:  05/07/23 for both medications  Future visit scheduled: yes  Notes to clinic:  both meds not delegated to NT to RF   Requested Prescriptions  Pending Prescriptions Disp Refills   meclizine (ANTIVERT) 25 MG tablet [Pharmacy Med Name: MECLIZINE TABS 25MG ] 30 tablet     Sig: TAKE 1 TABLET THREE TIMES A DAY AS NEEDED FOR DIZZINESS     Not Delegated - Gastroenterology: Antiemetics Failed - 08/18/2023 10:05 AM      Failed - This refill cannot be delegated      Passed - Valid encounter within last 6 months    Recent Outpatient Visits           1 month ago COVID-19   [facility] [place], [place] W, NP   1 month ago Type 2 diabetes mellitus with diabetic polyneuropathy, with long-term current use of insulin (HCC)   [facility] D[place], Gentry Fitz A, RPH-CPP   1 month ago Encounter for general adult medical examination with abnormal findings   [facility] [place], [place] W, NP   8 months ago Type 2 diabetes mellitus with hyperglycemia, with long-term current use of insulin [facility])   [facility] [place], Salvadore Oxford, NP   9 months ago Multifocal pneumonia   [facility] [place], Salvadore Oxford, NP       Future Appointments             In 4 months [name], Salvadore Oxford, NP [facility]             clonazePAM (KLONOPIN) 0.5 MG tablet [Pharmacy Med Name: CLONAZEPAM TABS 0.5MG ] 45 tablet     Sig: TAKE ONE-HALF (1/2) TO ONE TABLET TWICE A DAY AS NEEDED FOR ANXIETY     Not Delegated - Psychiatry: Anxiolytics/Hypnotics 2 Failed - 08/18/2023 10:05 AM      Failed - This refill cannot be delegated      Failed - Urine Drug Screen completed in last 360 days      Passed - Patient is not pregnant      Passed -  Valid encounter within last 6 months    Recent Outpatient Visits           1 month ago COVID-19   [facility] [place], [place], NP   1 month ago Type 2 diabetes mellitus with diabetic polyneuropathy, with long-term current use of insulin (HCC)   [facility] D[facility], Gentry Fitz A, RPH-CPP   1 month ago Encounter for general adult medical examination with abnormal findings   [facility] [place], [place], NP   8 months ago Type 2 diabetes mellitus with hyperglycemia, with long-term current use of insulin [facility])   [facility] [facility] [place], Salvadore Oxford, NP   9 months ago Multifocal pneumonia   [facility] [place], Salvadore Oxford, NP       Future Appointments             In 4 months [name], Salvadore Oxford, NP  [facility]            Signed Prescriptions Disp Refills   lisinopril (ZESTRIL) 5 MG  tablet 90 tablet 0    Sig: TAKE 1 TABLET DAILY     Cardiovascular:  ACE Inhibitors Passed - 08/18/2023 10:05 AM      Passed - Cr in normal range and within 180 days    Creat  Date Value Ref Range Status  07/04/2023 0.80 0.50 - 1.05 mg/dL Final   Creatinine, Urine  Date Value Ref Range Status  12/13/2022 110 20 - 275 mg/dL Final         Passed - K in normal range and within 180 days    Potassium  Date Value Ref Range Status  07/04/2023 4.6 3.5 - 5.3 mmol/L Final  05/06/2012 4.5 3.5 - 5.1 mmol/L Final         Passed - Patient is not pregnant      Passed - Last BP in normal range    BP Readings from Last 1 Encounters:  07/04/23 118/74         Passed - Valid encounter within last 6 months    Recent Outpatient Visits           1 month ago COVID-19   [facility] [place], [place], NP   1 month ago Type 2 diabetes mellitus with diabetic polyneuropathy, with long-term current use of insulin (HCC)   [place]  [facility] D[facility], Gentry Fitz A, RPH-CPP   1 month ago Encounter for general adult medical examination with abnormal findings   [facility] [place], [place], NP   8 months ago Type 2 diabetes mellitus with hyperglycemia, with long-term current use of insulin [facility])   [place] [facility] [place], Salvadore Oxford, NP   9 months ago Multifocal pneumonia   [facility] [place], Salvadore Oxford, NP       Future Appointments             In 4 months [name], Salvadore Oxford, NP [facility]             citalopram (CELEXA) 20 MG tablet 90 tablet 0    Sig: TAKE 1 TABLET DAILY     Psychiatry:  Antidepressants - SSRI Passed - 08/18/2023 10:05 AM      Passed - Completed PHQ-2 or PHQ-9 in the last 360 days      Passed - Valid encounter within last 6 months    Recent Outpatient Visits           1 month ago COVID-19   [facility] [place], [place], NP   1 month ago Type 2 diabetes mellitus with diabetic polyneuropathy, with long-term current use of insulin (HCC)   [facility] D[facility], Gentry Fitz A, RPH-CPP   1 month ago Encounter for general adult medical examination with abnormal findings   [facility] [place], [place], NP   8 months ago Type 2 diabetes mellitus with hyperglycemia, with long-term current use of insulin [facility])   [facility] [place], Salvadore Oxford, NP   9 months ago Multifocal pneumonia   [facility] [place], Salvadore Oxford, NP       Future Appointments             In 4 months [name], Salvadore Oxford, NP [facility] [place]  simvastatin (ZOCOR) 20 MG tablet 90 tablet 0    Sig: TAKE 1 TABLET DAILY AT 6 P.M.     Cardiovascular:  Antilipid - Statins Failed - 08/18/2023 10:05 AM      Failed - Lipid Panel in normal range  within the last 12 months    Cholesterol, Total  Date Value Ref Range Status  02/28/2017 135 100 - 199 mg/dL Final   Cholesterol  Date Value Ref Range Status  07/04/2023 125 <200 mg/dL Final   LDL Cholesterol (Calc)  Date Value Ref Range Status  07/04/2023 67 mg/dL (calc) Final    Comment:    Reference range: <100 . Desirable range <100 mg/dL for primary prevention;   <70 mg/dL for patients with CHD or diabetic patients  with > or = 2 CHD risk factors. Marland Kitchen LDL-C is now calculated using the Martin-Hopkins  calculation, which is a validated novel method providing  better accuracy than the Friedewald equation in the  estimation of LDL-C.  Horald Pollen et al. Lenox Ahr. 1610;960(45): 2061-2068  (http://education.Quest[employer].com/faq/FAQ164)    HDL  Date Value Ref Range Status  07/04/2023 38 (L) > OR = 50 mg/dL Final  40/98/1191 38 (L) >39 mg/dL Final   Triglycerides  Date Value Ref Range Status  07/04/2023 113 <150 mg/dL Final         Passed - Patient is not pregnant      Passed - Valid encounter within last 12 months    Recent Outpatient Visits           1 month ago COVID-19   [facility] [place], [place], NP   1 month ago Type 2 diabetes mellitus with diabetic polyneuropathy, with long-term current use of insulin (HCC)   [place] D[facility], Gentry Fitz A, RPH-CPP   1 month ago Encounter for general adult medical examination with abnormal findings   [facility] [place], [place], NP   8 months ago Type 2 diabetes mellitus with hyperglycemia, with long-term current use of insulin [facility])   [place] [facility] [place], Salvadore Oxford, NP   9 months ago Multifocal pneumonia   [place] [facility] [place], Salvadore Oxford, NP       Future Appointments             In 4 months [name], Salvadore Oxford, NP [facility]             busPIRone  (BUSPAR) 5 MG tablet 180 tablet 0    Sig: TAKE 1 TABLET TWICE A DAY     Psychiatry: Anxiolytics/Hypnotics - Non-controlled Passed - 08/18/2023 10:05 AM      Passed - Valid encounter within last 12 months    Recent Outpatient Visits           1 month ago COVID-19   [facility] [place], [place], NP   1 month ago Type 2 diabetes mellitus with diabetic polyneuropathy, with long-term current use of insulin (HCC)   [facility] Delles, Jackelyn Poling, RPH-CPP   1 month ago Encounter for general adult medical examination with abnormal findings   [place] [facility] [place], [place], NP   8 months ago Type 2 diabetes mellitus with hyperglycemia, with long-term current use of insulin [facility])   [facility] [place], Salvadore [place], [place]   9  months ago Multifocal pneumonia   [place] [place] [place], Salvadore Oxford, NP       Future Appointments             In 4 months [name], Salvadore Oxford, NP [facility], [place]

## 2023-08-25 ENCOUNTER — Encounter: Payer: Self-pay | Admitting: Pharmacist

## 2023-08-25 ENCOUNTER — Ambulatory Visit: Payer: Medicare Other | Admitting: Pharmacist

## 2023-08-25 DIAGNOSIS — E1142 Type 2 diabetes mellitus with diabetic polyneuropathy: Secondary | ICD-10-CM

## 2023-08-25 DIAGNOSIS — Z794 Long term (current) use of insulin: Secondary | ICD-10-CM

## 2023-08-25 NOTE — Patient Instructions (Signed)
Goals Addressed             This Visit's Progress    Pharmacy Goals       Please watch the mail for an envelope from [facility] containing the patient assistance program application. Please complete this application and bring to office to have it faxed back to Attention: Linward Foster at Fax # [phone] along with a copy of your Medicare Part D prescription card and a copy of your proof of income document.  If you need to call Joni Reining, you can reach her at [phone].  Our goal A1c is less than 7%. This corresponds with fasting sugars less than 130 and 2 hour after meal sugars less than 180. Please keep a log of your results when checking your blood sugar   Our goal bad cholesterol, or LDL, is less than 70 . This is why it is important to continue taking your simvastatin.  Please have your medications and blood sugar log with you for our next telephone call.   Estelle Grumbles, PharmD, [facility] Clinical Pharmacist [facility] [phone]

## 2023-08-25 NOTE — Progress Notes (Signed)
08/25/2023 Name: Brittany Pruitt MRN: [id number] DOB: [birth year]  Chief Complaint  Patient presents with   Medication Assistance   Medication Management    Brittany Pruitt is a 62 y.o. year old female who presented for a telephone visit.   They were referred to the pharmacist by their PCP for assistance in managing diabetes and medication access.      Subjective:   Care Team: Primary Care Provider: [name], NP ; Next Scheduled Visit: 01/02/2024 Nurse Care Manager: Carma Lair, RN; Next Scheduled Visit: 09/03/2023    Medication Access/Adherence  Current Pharmacy:  [facility] - [place], [place] - [address] [place] [address] Phone: [phone] Fax: [phone]  [id number] - [place], [place] - [address] AT [facility] [address] [place] [address] Phone: [phone] Fax: [phone]   Patient reports affordability concerns with their medications: No  Patient reports access/transportation concerns to their pharmacy: No  Patient reports adherence concerns with their medications:  No     Diabetes:   Current medications:  - Lantus 35 units twice daily - Ozempic 1 mg weekly (reports tolerating well             Reports tolerating well - glipizide 5 mg twice daily before meals  Confirms taking as directed   Medications tried in the past: metformin (GI side effects)   Recalls current glucose readings ranging:  Morning fasting: 85-100 After supper: 160-185   Denies symptoms of hypoglycemia   Current physical activity: reports active walking and gardening most days/week   Reports working on weight loss Reports improvement in appetite control with Ozempic Current weight ~165 lbs   Statin: simvastatin 20 mg daily   Current medication access support: enrolled in patient assistance program for Ozempic from [employer] through 11/04/2023 -  Reports currently has >2 month supply of Ozempic remaining   Objective:  Lab Results  Component Value Date   HGBA1C 5.8 (H) 07/04/2023    Lab Results  Component Value Date   CREATININE 0.80 07/04/2023   BUN 12 07/04/2023   NA 137 07/04/2023   K 4.6 07/04/2023   CL 103 07/04/2023   CO2 22 07/04/2023    Lab Results  Component Value Date   CHOL 125 07/04/2023   HDL 38 (L) 07/04/2023   LDLCALC 67 07/04/2023   TRIG 113 07/04/2023   CHOLHDL 3.3 07/04/2023   BP Readings from Last 3 Encounters:  07/04/23 118/74  12/13/22 106/62  11/08/22 116/68   Pulse Readings from Last 3 Encounters:  07/04/23 61  12/13/22 80  11/08/22 66     Medications Reviewed Today     Reviewed by Manuela Neptune, RPH-CPP (Pharmacist) on 08/25/23 at 1044  Med List Status: <None>   Medication Order Taking? Sig Documenting Provider Last Dose Status Informant  acetaminophen (TYLENOL) 500 MG tablet [id number]  Take 500 mg by mouth every 6 (six) hours as needed for moderate pain. [provider]  Active Self  albuterol (VENTOLIN HFA) 108 (90 Base) MCG/ACT inhaler [id number]  Inhale 2 puffs into the lungs every 6 (six) hours as needed for wheezing or shortness of breath. [place], Alvira Philips, DO  Active   busPIRone (BUSPAR) 5 MG tablet [id number]  TAKE 1 TABLET TWICE A DAY [name], Salvadore Oxford, NP  Active   Cholecalciferol (VITAMIN D3) 50 MCG (2000 UT) capsule [id number]  Take 2,000 Units by  mouth daily. [provider]  Active Self  citalopram (CELEXA) 20 MG tablet [id number]  TAKE 1 TABLET DAILY [name], Salvadore Oxford, NP  Active   clonazePAM (KLONOPIN) 0.5 MG tablet [id number]  TAKE ONE-HALF (1/2) TO ONE TABLET TWICE A DAY AS NEEDED FOR ANXIETY [name], Salvadore Oxford, NP  Active   cyanocobalamin (VITAMIN B12) 1000 MCG tablet [id number]  Take 1,000 mcg by mouth daily. [provider]  Active Self  gabapentin (NEURONTIN) 300 MG capsule [id number]  TAKE 1 CAPSULE FOUR TIMES A DAY [name], Salvadore Oxford, NP  Active    glipiZIDE (GLUCOTROL) 5 MG tablet [id number] Yes TAKE 1 TABLET TWICE A DAY BEFORE MEALS Smitty Cords, DO Taking Active   LANTUS SOLOSTAR 100 UNIT/ML Solostar Pen [id number] Yes INJECT 35 UNITS UNDER THE SKIN TWICE A DAY [name], NP Taking Active   lisinopril (ZESTRIL) 5 MG tablet [id number] Yes TAKE 1 TABLET DAILY [name], NP Taking Active   meclizine (ANTIVERT) 25 MG tablet [id number]  TAKE 1 TABLET THREE TIMES A DAY AS NEEDED FOR DIZZINESS [name], NP  Active   Multiple Vitamins-Minerals (CENTRUM SILVER 50+WOMEN PO) [id number]  Take 1 tablet by mouth daily. [provider]  Active Self  Omega-3 Fatty Acids (FISH OIL) 1000 MG CAPS [id number]  Take 1 capsule by mouth in the morning and at bedtime. [provider]  Active Self  omeprazole (PRILOSEC) 20 MG capsule [id number]  TAKE 1 CAPSULE DAILY [name], Salvadore Oxford, NP  Active   Semaglutide, 1 MG/DOSE, (OZEMPIC, 1 MG/DOSE,) 4 MG/3ML SOPN [id number] Yes Inject 1 mg into the skin once a week. [provider] Taking Active   simvastatin (ZOCOR) 20 MG tablet [id number] Yes TAKE 1 TABLET DAILY AT 6 P.M. [name], NP Taking Active               Assessment/Plan:   Congratulate patient on continued smoking cessation   Diabetes: - Reviewed goal A1c, goal fasting, and goal 2 hour post prandial glucose - Reviewed dietary modifications including Encourage patient to have regular well-balanced meals and snacks spread throughout the day, while controlling carbohydrate portion sizes             Advise patient against skipping meals - Counsel patient to continue to monitor home blood sugar, keep log of results, have this record to review during upcoming appointments and to contact office sooner if needed for reading outside of established parameters or for symptoms   - Will collaborate with PCP and CPhT to aid patient with applying for re-enrollment in for Ozempic patient assistance from [employer] for 2025 calendar year   Follow Up Plan: Clinical Pharmacist will follow up with patient by telephone on 11/24/2023 at 10:00 AM  Estelle Grumbles, PharmD, [facility] Clinical Pharmacist [facility] [phone]

## 2023-09-03 ENCOUNTER — Other Ambulatory Visit: Payer: Self-pay | Admitting: *Deleted

## 2023-09-03 ENCOUNTER — Other Ambulatory Visit: Payer: Self-pay

## 2023-09-03 NOTE — Patient Outreach (Signed)
Care Management   Visit Note  09/03/2023 Name: Brittany Pruitt MRN: [id number] DOB: January 11, 1961  Subjective: Brittany Pruitt is a 62 y.o. year old female who is a primary care patient of [name], NP. The Care Management team was consulted for assistance.      Engaged with patient spoke with patient by telephone.    Goals Addressed             This Visit's Progress    RNCM Care Management  Expected Outcome:  Monitor, Self-Manage and Reduce Symptoms of Diabetes       Current Barriers:  Knowledge Deficits related to the importance of blood sugars in range to prevent complications from DM Care Coordination needs related to cost constraints of Mounjaro and her insurance not covering Mounjaro in a patient with DM Chronic Disease Management support and education needs related to effective management of DM Financial Constraints.  Last eye exam August 2024 with no diabetic retinopathy being noted Lab Results  Component Value Date   HGBA1C 5.8 (H) 07/04/2023    Planned Interventions: Provided education to patient about basic DM disease process. Patient A1C below goal and patient stated she is doing great with her DM management. She is very pleased with her progress and states is not experiencing any low blood sugars and the highest she has seen is around 180. Education provided.  Reviewed medications with patient and discussed importance of medication adherence. The patient works with the pharm D for medication adherence and education. Continues to tolerate all medications as prescribed and she is currently awaiting pap paperwork for 2025 for Ozempic Reviewed prescribed diet with patient heart healthy/ADA diet. Education given. Patient has been managing her diabetes well with diet and medication regime. Counseled on importance of regular laboratory monitoring as prescribed. Labs are up to date. Patient has surpassed A1C goal and states she is doing really good at this  time. Discussed plans with patient for ongoing care management follow up and provided patient with direct contact information for care management team;      Provided patient with written educational materials related to hypo and hyperglycemia and importance of correct treatment. Review and education.       Advised patient, providing education and rationale, to check cbg twice daily and when you have symptoms of low or high blood sugar and record. The patient states her blood sugar range in am is 85 to 100, before meals 130 to 160 and after meals 160 to 185. Education on the goal of fasting <130 or less and the goal of post prandial of <180.  The patient is doing better since being back on Ozempic and mindful of what she is eating.  call provider for findings outside established parameters;       Referral made to pharmacy team for assistance with cost constraints and side effects of medications. Is working with the pharm D and has been approved for [employer]. Has been approved and is thankful for this.    Review of patient status, including review of consultants reports, relevant laboratory and other test results, and medications completed;       Advised patient to discuss changes in her DM health and well being with provider;      Screening for signs and symptoms of depression related to chronic disease state;        Assessed social determinant of health barriers;   The patient states she is overdue for eye exam. Plans to call and get one scheduled  in the next couple of months. Encouraged the patient to have yearly exams. Does check her feet daily for scraps, scratches, cuts, sores. Review of keeping feet clean and dry.       The patient states she needs to make an appointment to get her eyes checked. Education and support given.    Symptom Management: Take medications as prescribed   Attend all scheduled provider appointments Call provider office for new concerns or questions  call the Suicide and  Crisis Lifeline: 988 call the [place] National Suicide Prevention Lifeline: 219 700 9688 or TTY: 201-041-8994 TTY 9023415420) to talk to a trained counselor call 1-800-273-TALK (toll free, 24 hour hotline) if experiencing a Mental Health or Behavioral Health Crisis  schedule appointment with eye doctor check feet daily for cuts, sores or redness trim toenails straight across manage portion size wash and dry feet carefully every day wear comfortable, cotton socks wear comfortable, well-fitting shoes  Follow Up Plan: Telephone follow up appointment with care management team member scheduled for: 10-27-2023 at 0900 am       RNCM Care Management  Expected Outcome:  Monitor, Self-Manage, and Reduce Symptoms of Hypertension       Current Barriers:  Knowledge Deficits related to the need to have normalized blood pressures to prevent the risk of heart attack or stroke Chronic Disease Management support and education needs related to effective management of HTN BP Readings from Last 3 Encounters:  07/04/23 118/74  12/13/22 106/62  11/08/22 116/68     Planned Interventions: Evaluation of current treatment plan related to hypertension self management and patient's adherence to plan as established by provider. The patient states her blood pressures are stable. Does not take at home. States that this is usually good for her. Knows when to call the provider for changes. Will continue to monitor.;   Provided education to patient re: stroke prevention, s/s of heart attack and stroke. Praised the patient for continued smoking cessation; Reviewed prescribed diet heart healthy/ADA diet. Education and review.  Reviewed medications with patient and discussed importance of compliance. Is compliant with medications. Works with the pharm D on a regular basis. Denies any issues with medication compliance;  Counseled on the importance of exercise goals with target of 150 minutes per week. The patient is mowing  her yard and actually doing well. She paces her activity. Denies any new issues with activity intolerance. Enjoys being outside. States she is doing well. Denies any new concerns or needs at this time.  Discussed plans with patient for ongoing care management follow up and provided patient with direct contact information for care management team; Advised patient, providing education and rationale, to monitor blood pressure daily and record, calling PCP for findings outside established parameters. The patient states she checks her blood pressures when needed. States they are WNL. Denies any acute findings related to blood pressures ;  Reviewed scheduled/upcoming provider appointments including: 01-02-2024 with PCP Advised patient to discuss changes in her HTN and heart health  with provider; Provided education on prescribed diet heart healthy/ADA. Education and support given. Review of healthy eating options ;  Discussed complications of poorly controlled blood pressure such as heart disease, stroke, circulatory complications, vision complications, kidney impairment, sexual dysfunction;  Screening for signs and symptoms of depression related to chronic disease state;  Assessed social determinant of health barriers;  Praised patient for all of her great accomplishments with her chronic disease management  Symptom Management: Take medications as prescribed   Attend all scheduled provider appointments  Call provider office for new concerns or questions  call the Suicide and Crisis Lifeline: 988 call the [place] National Suicide Prevention Lifeline: 6363931056 or TTY: 939-868-5351 TTY 606-149-9835) to talk to a trained counselor call 1-800-273-TALK (toll free, 24 hour hotline) if experiencing a Mental Health or Behavioral Health Crisis  check blood pressure weekly learn about high blood pressure keep a blood pressure log take blood pressure log to all doctor appointments call doctor for signs and  symptoms of high blood pressure develop an action plan for high blood pressure keep all doctor appointments take medications for blood pressure exactly as prescribed report new symptoms to your doctor  Follow Up Plan: Telephone follow up appointment with care management team member scheduled for: 10-27-2023 at 0900 am              Consent to Services:  Patient was given information about care management services, agreed to services, and gave verbal consent to participate.   Plan: Telephone follow up appointment with care management team member scheduled for: 10-27-2023 at 9:00 am  Danise Edge, BSN RN RN Care Manager  [facility]  Ambulatory Care Management  Direct Number: [phone]

## 2023-09-03 NOTE — Patient Instructions (Signed)
Visit Information  Thank you for taking time to visit with me today. Please don't hesitate to contact me if I can be of assistance to you before our next scheduled telephone appointment.  Following are the goals we discussed today:   Goals Addressed             This Visit's Progress    RNCM Care Management  Expected Outcome:  Monitor, Self-Manage and Reduce Symptoms of Diabetes       Current Barriers:  Knowledge Deficits related to the importance of blood sugars in range to prevent complications from DM Care Coordination needs related to cost constraints of Mounjaro and her insurance not covering Mounjaro in a patient with DM Chronic Disease Management support and education needs related to effective management of DM Financial Constraints.  Last eye exam August 2024 with no diabetic retinopathy being noted Lab Results  Component Value Date   HGBA1C 5.8 (H) 07/04/2023    Planned Interventions: Provided education to patient about basic DM disease process. Patient A1C below goal and patient stated she is doing great with her DM management. She is very pleased with her progress and states is not experiencing any low blood sugars and the highest she has seen is around 180. Education provided.  Reviewed medications with patient and discussed importance of medication adherence. The patient works with the pharm D for medication adherence and education. Continues to tolerate all medications as prescribed and she is currently awaiting pap paperwork for 2025 for Ozempic Reviewed prescribed diet with patient heart healthy/ADA diet. Education given. Patient has been managing her diabetes well with diet and medication regime. Counseled on importance of regular laboratory monitoring as prescribed. Labs are up to date. Patient has surpassed A1C goal and states she is doing really good at this time. Discussed plans with patient for ongoing care management follow up and provided patient with direct  contact information for care management team;      Provided patient with written educational materials related to hypo and hyperglycemia and importance of correct treatment. Review and education.       Advised patient, providing education and rationale, to check cbg twice daily and when you have symptoms of low or high blood sugar and record. The patient states her blood sugar range in am is 85 to 100, before meals 130 to 160 and after meals 160 to 185. Education on the goal of fasting <130 or less and the goal of post prandial of <180.  The patient is doing better since being back on Ozempic and mindful of what she is eating.  call provider for findings outside established parameters;       Referral made to pharmacy team for assistance with cost constraints and side effects of medications. Is working with the pharm D and has been approved for [employer]. Has been approved and is thankful for this.    Review of patient status, including review of consultants reports, relevant laboratory and other test results, and medications completed;       Advised patient to discuss changes in her DM health and well being with provider;      Screening for signs and symptoms of depression related to chronic disease state;        Assessed social determinant of health barriers;   The patient states she is overdue for eye exam. Plans to call and get one scheduled in the next couple of months. Encouraged the patient to have yearly exams. Does check her feet daily  for scraps, scratches, cuts, sores. Review of keeping feet clean and dry.       The patient states she needs to make an appointment to get her eyes checked. Education and support given.    Symptom Management: Take medications as prescribed   Attend all scheduled provider appointments Call provider office for new concerns or questions  call the Suicide and Crisis Lifeline: 988 call the [place] National Suicide Prevention Lifeline: (970)183-9411 or TTY: 2183329897  TTY (475) 316-1860) to talk to a trained counselor call 1-800-273-TALK (toll free, 24 hour hotline) if experiencing a Mental Health or Behavioral Health Crisis  schedule appointment with eye doctor check feet daily for cuts, sores or redness trim toenails straight across manage portion size wash and dry feet carefully every day wear comfortable, cotton socks wear comfortable, well-fitting shoes  Follow Up Plan: Telephone follow up appointment with care management team member scheduled for: 10-27-2023 at 0900 am       RNCM Care Management  Expected Outcome:  Monitor, Self-Manage, and Reduce Symptoms of Hypertension       Current Barriers:  Knowledge Deficits related to the need to have normalized blood pressures to prevent the risk of heart attack or stroke Chronic Disease Management support and education needs related to effective management of HTN BP Readings from Last 3 Encounters:  07/04/23 118/74  12/13/22 106/62  11/08/22 116/68     Planned Interventions: Evaluation of current treatment plan related to hypertension self management and patient's adherence to plan as established by provider. The patient states her blood pressures are stable. Does not take at home. States that this is usually good for her. Knows when to call the provider for changes. Will continue to monitor.;   Provided education to patient re: stroke prevention, s/s of heart attack and stroke. Praised the patient for continued smoking cessation; Reviewed prescribed diet heart healthy/ADA diet. Education and review.  Reviewed medications with patient and discussed importance of compliance. Is compliant with medications. Works with the pharm D on a regular basis. Denies any issues with medication compliance;  Counseled on the importance of exercise goals with target of 150 minutes per week. The patient is mowing her yard and actually doing well. She paces her activity. Denies any new issues with activity intolerance.  Enjoys being outside. States she is doing well. Denies any new concerns or needs at this time.  Discussed plans with patient for ongoing care management follow up and provided patient with direct contact information for care management team; Advised patient, providing education and rationale, to monitor blood pressure daily and record, calling PCP for findings outside established parameters. The patient states she checks her blood pressures when needed. States they are WNL. Denies any acute findings related to blood pressures ;  Reviewed scheduled/upcoming provider appointments including: 01-02-2024 with PCP Advised patient to discuss changes in her HTN and heart health  with provider; Provided education on prescribed diet heart healthy/ADA. Education and support given. Review of healthy eating options ;  Discussed complications of poorly controlled blood pressure such as heart disease, stroke, circulatory complications, vision complications, kidney impairment, sexual dysfunction;  Screening for signs and symptoms of depression related to chronic disease state;  Assessed social determinant of health barriers;  Praised patient for all of her great accomplishments with her chronic disease management  Symptom Management: Take medications as prescribed   Attend all scheduled provider appointments Call provider office for new concerns or questions  call the Suicide and Crisis Lifeline: 988 call the  [place] National Suicide Prevention Lifeline: 743-850-3343 or TTY: (701) 263-0572 TTY 531-586-9312) to talk to a trained counselor call 1-800-273-TALK (toll free, 24 hour hotline) if experiencing a Mental Health or Behavioral Health Crisis  check blood pressure weekly learn about high blood pressure keep a blood pressure log take blood pressure log to all doctor appointments call doctor for signs and symptoms of high blood pressure develop an action plan for high blood pressure keep all doctor  appointments take medications for blood pressure exactly as prescribed report new symptoms to your doctor  Follow Up Plan: Telephone follow up appointment with care management team member scheduled for: 10-27-2023 at 0900 am           Our next appointment is by telephone on 10-27-2023 at 9:00 am  Please call the care guide team at [phone] if you need to cancel or reschedule your appointment.   If you are experiencing a Mental Health or Behavioral Health Crisis or need someone to talk to, please call the Suicide and Crisis Lifeline: 988 call the [place] National Suicide Prevention Lifeline: 404-357-2940 or TTY: 424-423-0140 TTY 7437979703) to talk to a trained counselor call 1-800-273-TALK (toll free, 24 hour hotline) call 911   Patient verbalizes understanding of instructions and care plan provided today and agrees to view in MyChart. Active MyChart status and patient understanding of how to access instructions and care plan via MyChart confirmed with patient.     Telephone follow up appointment with care management team member scheduled for: 10-27-2023 at 9:00 am  Danise Edge, BSN RN RN Care Manager  [facility]  Ambulatory Care Management  Direct Number: [phone]

## 2023-09-16 ENCOUNTER — Telehealth: Payer: Self-pay

## 2023-09-16 NOTE — Telephone Encounter (Signed)
 PAP: PAP application for [employer], Publishing rights manager ([employer])) has been mailed to pt's home address on file. Will fax provider portion of application to provider's office when pt's portion is received.    PLEASE BE ADVISED

## 2023-09-16 NOTE — Progress Notes (Unsigned)
Pharmacy Medication Assistance Program Note    09/16/2023  Patient ID: Natally Hellenbrand, female   DOB: [birth year], 62 y.o.   MRN: [id number]     09/16/2023  Outreach Medication One  Initial Outreach Date (Medication One) 09/16/2023  [facility] Ozempic  Dose of Ozempic 1 MG  Type of Assistance Manufacturer Assistance  Date Application Sent to Patient 09/16/2023  Application Items Requested Application;Proof of Income  Name of Prescriber REGINA [name]       Signature

## 2023-09-16 NOTE — Telephone Encounter (Signed)
-----   Message from Manuela Neptune sent at 08/25/2023 10:55 AM EDT ----- Joni Reining,  Would you please assist patient with re-enrollment for Ozempic PAP from [employer]isk for 2025, as prescribed by PCP?  Thank you!  Gentry Fitz

## 2023-09-22 NOTE — Telephone Encounter (Signed)
A user error has taken place: medication ordered in error, not dispensed to this patient.  Please be advised I used wrong smart phase I mailed NOVO NORDISK PAP FOR OZEMPIC TO HOME NOT FARXIGA   PLEASE BE ADIVSED

## 2023-10-22 NOTE — Telephone Encounter (Signed)
RECEIVED PT PG AND FAXED PROVIDER PAGES TO PROVIDER REGINA [name] OFFICE   for OZEMPIC(NOVO NORDISK) for patient assistance.   PLEASE BE ADVISED

## 2023-10-23 ENCOUNTER — Telehealth: Payer: Medicare Other | Admitting: Physician Assistant

## 2023-10-23 DIAGNOSIS — J069 Acute upper respiratory infection, unspecified: Secondary | ICD-10-CM | POA: Diagnosis not present

## 2023-10-23 MED ORDER — DOXYCYCLINE HYCLATE 100 MG PO TABS
100.0000 mg | ORAL_TABLET | Freq: Two times a day (BID) | ORAL | 0 refills | Status: DC
Start: 2023-10-23 — End: 2024-01-06

## 2023-10-23 MED ORDER — BENZONATATE 100 MG PO CAPS: 100.0000 mg | ORAL_CAPSULE | Freq: Three times a day (TID) | ORAL | 0 refills | Status: DC | PRN

## 2023-10-23 NOTE — Progress Notes (Signed)
E-Visit for Upper Respiratory Infection   We are sorry you are not feeling well.  Here is how we plan to help!  Based on what you have shared with me, it looks like you may have a viral upper respiratory infection.  Upper respiratory infections are caused by a large number of viruses; however, rhinovirus is the most common cause. Having only a couple of days of symptoms, a virus is the most likely cause. However, giving your medical history, I have added on some additional treatments below to start if symptoms not turning the corner over the next few days with the other recommendations given.  Symptoms vary from person to person, with common symptoms including sore throat, cough, fatigue or lack of energy and feeling of general discomfort.  A low-grade fever of up to 100.4 may present, but is often uncommon.  Symptoms vary however, and are closely related to a person's age or underlying illnesses.  The most common symptoms associated with an upper respiratory infection are nasal discharge or congestion, cough, sneezing, headache and pressure in the ears and face.  These symptoms usually persist for about 3 to 10 days, but can last up to 2 weeks.  It is important to know that upper respiratory infections do not cause serious illness or complications in most cases.    Upper respiratory infections can be transmitted from person to person, with the most common method of transmission being a person's hands.  The virus is able to live on the skin and can infect other persons for up to 2 hours after direct contact.  Also, these can be transmitted when someone coughs or sneezes; thus, it is important to cover the mouth to reduce this risk.  To keep the spread of the illness at bay, good hand hygiene is very important.  This is an infection that is most likely caused by a virus. There are no specific treatments other than to help you with the symptoms until the infection runs its course.  We are sorry you are not  feeling well.  Here is how we plan to help!   For nasal congestion, you may use an oral decongestants such as Mucinex D or if you have glaucoma or high blood pressure use plain Mucinex.  Saline nasal spray or nasal drops can help and can safely be used as often as needed for congestion.    If you do not have a history of heart disease, hypertension, diabetes or thyroid disease, prostate/bladder issues or glaucoma, you may also use Sudafed to treat nasal congestion.  It is highly recommended that you consult with a pharmacist or your primary care physician to ensure this medication is safe for you to take.     If you have a cough, you may use cough suppressants such as Delsym and Robitussin.  If you have glaucoma or high blood pressure, you can also use Coricidin HBP.   For cough I have prescribed for you A prescription cough medication called Tessalon Perles 100 mg. You may take 1-2 capsules every 8 hours as needed for cough  If you have a sore or scratchy throat, use a saltwater gargle-  to  teaspoon of salt dissolved in a 4-ounce to 8-ounce glass of warm water.  Gargle the solution for approximately 15-30 seconds and then spit.  It is important not to swallow the solution.  You can also use throat lozenges/cough drops and Chloraseptic spray to help with throat pain or discomfort.  Warm or cold  liquids can also be helpful in relieving throat pain.  For headache, pain or general discomfort, you can use Ibuprofen or Tylenol as directed.   Some authorities believe that zinc sprays or the use of Echinacea may shorten the course of your symptoms.  I have placed a script for Doxycycline on file at the pharmacy to start as directed if symptoms are not turning the corner over the next few days or if anything continues to progress despite other recommended treatments.   HOME CARE Only take medications as instructed by your medical team. Be sure to drink plenty of fluids. Water is fine as well as fruit  juices, sodas and electrolyte beverages. You may want to stay away from caffeine or alcohol. If you are nauseated, try taking small sips of liquids. How do you know if you are getting enough fluid? Your urine should be a pale yellow or almost colorless. Get rest. Taking a steamy shower or using a humidifier may help nasal congestion and ease sore throat pain. You can place a towel over your head and breathe in the steam from hot water coming from a faucet. Using a saline nasal spray works much the same way. Cough drops, hard candies and sore throat lozenges may ease your cough. Avoid close contacts especially the very young and the elderly Cover your mouth if you cough or sneeze Always remember to wash your hands.   GET HELP RIGHT AWAY IF: You develop worsening fever. If your symptoms do not improve within 10 days You develop yellow or green discharge from your nose over 3 days. You have coughing fits You develop a severe head ache or visual changes. You develop shortness of breath, difficulty breathing or start having chest pain Your symptoms persist after you have completed your treatment plan  MAKE SURE YOU  Understand these instructions. Will watch your condition. Will get help right away if you are not doing well or get worse.  Thank you for choosing an e-visit.  Your e-visit answers were reviewed by a board certified advanced clinical practitioner to complete your personal care plan. Depending upon the condition, your plan could have included both over the counter or prescription medications.  Please review your pharmacy choice. Make sure the pharmacy is open so you can pick up prescription now. If there is a problem, you may contact your provider through [employer] and have the prescription routed to another pharmacy.  Your safety is important to [place]. If you have drug allergies check your prescription carefully.   For the next 24 hours you can use MyChart to ask questions  about today's visit, request a non-urgent call back, or ask for a work or school excuse. You will get an email in the next two days asking about your experience. I hope that your e-visit has been valuable and will speed your recovery.

## 2023-10-23 NOTE — Progress Notes (Signed)
I have spent 5 minutes in review of e-visit questionnaire, review and updating patient chart, medical decision making and response to patient.   Mia Milan Cody Jacklynn Dehaas, PA-C    

## 2023-10-27 ENCOUNTER — Other Ambulatory Visit: Payer: Self-pay

## 2023-10-27 ENCOUNTER — Telehealth: Payer: Self-pay

## 2023-10-27 ENCOUNTER — Encounter: Payer: Self-pay | Admitting: Internal Medicine

## 2023-10-27 ENCOUNTER — Telehealth (INDEPENDENT_AMBULATORY_CARE_PROVIDER_SITE_OTHER): Payer: Medicare Other | Admitting: Internal Medicine

## 2023-10-27 DIAGNOSIS — J441 Chronic obstructive pulmonary disease with (acute) exacerbation: Secondary | ICD-10-CM | POA: Diagnosis not present

## 2023-10-27 DIAGNOSIS — Z87891 Personal history of nicotine dependence: Secondary | ICD-10-CM

## 2023-10-27 DIAGNOSIS — J069 Acute upper respiratory infection, unspecified: Secondary | ICD-10-CM | POA: Diagnosis not present

## 2023-10-27 MED ORDER — PREDNISONE 10 MG PO TABS
ORAL_TABLET | ORAL | 0 refills | Status: DC
Start: 2023-10-27 — End: 2023-10-27

## 2023-10-27 MED ORDER — PREDNISONE 10 MG PO TABS: ORAL_TABLET | ORAL | 0 refills | Status: DC

## 2023-10-27 NOTE — Progress Notes (Signed)
Virtual Visit via Video Note  I connected with Brittany Pruitt on 10/27/23 at  4:00 PM EST by a video enabled telemedicine application and verified that I am speaking with the correct person using two identifiers.  Location: Patient: Home Provider: Office  Person's participating in this video call: Nicki Reaper, NP-C   I discussed the limitations of evaluation and management by telemedicine and the availability of in person appointments. The patient expressed understanding and agreed to proceed.  History of Present Illness:   Discussed the use of AI scribe software for clinical note transcription with the patient, who gave verbal consent to proceed.   Brittany Pruitt, with a history of COPD, presented with an upper respiratory infection diagnosed four days prior via an e-visit. She was prescribed doxycycline and tessalon. The illness onset was approximately two days before the e-visit.  The most severe symptoms were experienced the day before the current consultation, with intense coughing leading to sore ribs. The coughing was described as a 'ball' in the chest, non-productive in nature. She also reported shortness of breath.  In addition to the cough and dyspnea, she experienced nasal congestion to the point of dripping, which has since resolved. She reported a low-grade fever a few days prior, but no recent fevers. She also experienced nausea and vomiting, which she attributed to taking medication on an empty stomach.  She has a history of pneumonia, which led to a hospital stay the previous year. She expressed concern about a similar situation occurring again, particularly affecting her breathing.  In terms of medication, she has been taking the prescribed doxycycline and tessalon, and had taken mucinex capsules prior to starting the antibiotics. She reported that a previous prescription of prednisone did not seem to help her symptoms. She uses albuterol for her COPD but does not have a  daily inhaler.      Past Medical History:  Diagnosis Date   Anxiety    Chicken pox    Depression    Diabetes mellitus without complication (HCC)    Frequent headaches    Hyperlipidemia     Current Outpatient Medications  Medication Sig Dispense Refill   acetaminophen (TYLENOL) 500 MG tablet Take 500 mg by mouth every 6 (six) hours as needed for moderate pain.     albuterol (VENTOLIN HFA) 108 (90 Base) MCG/ACT inhaler Inhale 2 puffs into the lungs every 6 (six) hours as needed for wheezing or shortness of breath. 8 g 2   benzonatate (TESSALON) 100 MG capsule Take 1 capsule (100 mg total) by mouth 3 (three) times daily as needed for cough. 30 capsule 0   busPIRone (BUSPAR) 5 MG tablet TAKE 1 TABLET TWICE A DAY 180 tablet 0   Cholecalciferol (VITAMIN D3) 50 MCG (2000 UT) capsule Take 2,000 Units by mouth daily.     citalopram (CELEXA) 20 MG tablet TAKE 1 TABLET DAILY 90 tablet 0   clonazePAM (KLONOPIN) 0.5 MG tablet TAKE ONE-HALF (1/2) TO ONE TABLET TWICE A DAY AS NEEDED FOR ANXIETY 45 tablet 0   cyanocobalamin (VITAMIN B12) 1000 MCG tablet Take 1,000 mcg by mouth daily.     doxycycline (VIBRA-TABS) 100 MG tablet Take 1 tablet (100 mg total) by mouth 2 (two) times daily. 14 tablet 0   gabapentin (NEURONTIN) 300 MG capsule TAKE 1 CAPSULE FOUR TIMES A DAY 360 capsule 0   glipiZIDE (GLUCOTROL) 5 MG tablet TAKE 1 TABLET TWICE A DAY BEFORE MEALS 180 tablet 1   LANTUS SOLOSTAR 100 UNIT/ML Solostar Pruitt  INJECT 35 UNITS UNDER THE SKIN TWICE A DAY 60 mL 3   lisinopril (ZESTRIL) 5 MG tablet TAKE 1 TABLET DAILY 90 tablet 0   meclizine (ANTIVERT) 25 MG tablet TAKE 1 TABLET THREE TIMES A DAY AS NEEDED FOR DIZZINESS 30 tablet 0   Multiple Vitamins-Minerals (CENTRUM SILVER 50+WOMEN PO) Take 1 tablet by mouth daily.     Omega-3 Fatty Acids (FISH OIL) 1000 MG CAPS Take 1 capsule by mouth in the morning and at bedtime.     omeprazole (PRILOSEC) 20 MG capsule TAKE 1 CAPSULE DAILY 90 capsule 2   Semaglutide,  1 MG/DOSE, (OZEMPIC, 1 MG/DOSE,) 4 MG/3ML SOPN Inject 1 mg into the skin once a week.     simvastatin (ZOCOR) 20 MG tablet TAKE 1 TABLET DAILY AT 6 P.M. 90 tablet 0   No current facility-administered medications for this visit.    Allergies  Allergen Reactions   Sulfa Antibiotics Hives    Family History  Problem Relation Age of Onset   Uterine cancer Mother    Breast cancer Mother 36   Pancreatic cancer Mother    Heart disease Father    Hypertension Father    Anxiety disorder Father    Diabetes Father    Uterine cancer Maternal Aunt    Breast cancer Maternal Aunt 82   Heart disease Paternal Grandfather     Social History   Socioeconomic History   Marital status: Married    Spouse name: Jacqulyne Cardosa   Number of children: Not on file   Years of education: Not on file   Highest education level: 12th grade  Occupational History   Not on file  Tobacco Use   Smoking status: Former    Current packs/day: 0.00    Average packs/day: 1 pack/day for 30.0 years (30.0 ttl pk-yrs)    Types: Cigarettes    Quit date: 01/14/2023    Years since quitting: 0.7   Smokeless tobacco: Never  Vaping Use   Vaping status: Never Used  Substance and Sexual Activity   Alcohol use: No    Alcohol/week: 0.0 standard drinks of alcohol   Drug use: Never   Sexual activity: Yes    Partners: Male  Other Topics Concern   Not on file  Social History Narrative   Pt lives with husband Casimiro Needle.  Is not currently working.    Social Drivers of Health   Financial Resource Strain: Medium Risk (07/01/2023)   Overall Financial Resource Strain (CARDIA)    Difficulty of Paying Living Expenses: Somewhat hard  Food Insecurity: No Food Insecurity (07/01/2023)   Hunger Vital Sign    Worried About Running Out of Food in the Last Year: Never true    Ran Out of Food in the Last Year: Never true  Transportation Needs: No Transportation Needs (07/01/2023)   PRAPARE - Administrator, Civil Service  (Medical): No    Lack of Transportation (Non-Medical): No  Physical Activity: Inactive (09/03/2023)   Exercise Vital Sign    Days of Exercise per Week: 0 days    Minutes of Exercise per Session: 0 min  Stress: Stress Concern Present (07/01/2023)   [employer] of Occupational Health - Occupational Stress Questionnaire    Feeling of Stress : To some extent  Social Connections: Moderately Isolated (07/01/2023)   Social Connection and Isolation Panel [NHANES]    Frequency of Communication with Friends and Family: More than three times a week    Frequency of Social Gatherings with Friends  and Family: Three times a week    Attends Religious Services: Never    Active Member of Clubs or Organizations: No    Attends Banker Meetings: Never    Marital Status: Married  Catering manager Violence: Not At Risk (05/19/2023)   Humiliation, Afraid, Rape, and Kick questionnaire    Fear of Current or Ex-Partner: No    Emotionally Abused: No    Physically Abused: No    Sexually Abused: No     Constitutional: Denies fever, malaise, fatigue, headache or abrupt weight changes.  HEENT: Pt reports nasal congestion. Denies eye pain, eye redness, ear pain, ringing in the ears, wax buildup, runny nose, bloody nose, or sore throat. Respiratory: Pt reports cough and shortness of breath. Denies difficulty breathing, or sputum production.   Cardiovascular: Denies chest pain, chest tightness, palpitations or swelling in the hands or feet.  Gastrointestinal: Pt reports vomiting. Denies abdominal pain, bloating, constipation, diarrhea or blood in the stool.    No other specific complaints in a complete review of systems (except as listed in HPI above).  Observations/Objective:  Wt Readings from Last 3 Encounters:  07/04/23 170 lb (77.1 kg)  05/19/23 170 lb (77.1 kg)  12/13/22 193 lb (87.5 kg)    General: Appears her stated age, appears unwell but in NAD. Skin: Warm, dry and intact. No rashes  noted. HEENT: Head: normal shape and size; Nose:  congestion noted; Throat/Mouth: hoarseness noted.  Pulmonary/Chest: Normal effort. No respiratory distress.  Neurological: Alert and oriented.   BMET    Component Value Date/Time   NA 137 07/04/2023 1325   NA 141 03/16/2018 1537   NA 137 05/06/2012 0949   K 4.6 07/04/2023 1325   K 4.5 05/06/2012 0949   CL 103 07/04/2023 1325   CL 103 05/06/2012 0949   CO2 22 07/04/2023 1325   CO2 27 05/06/2012 0949   GLUCOSE 35 (LL) 07/04/2023 1325   GLUCOSE 123 (H) 05/06/2012 0949   BUN 12 07/04/2023 1325   BUN 13 03/16/2018 1537   BUN 10 05/06/2012 0949   CREATININE 0.80 07/04/2023 1325   CALCIUM 9.1 07/04/2023 1325   CALCIUM 9.2 05/06/2012 0949   GFRNONAA >60 10/31/2022 0341   GFRNONAA >60 05/06/2012 0949   GFRAA 103 03/16/2018 1537   GFRAA >60 05/06/2012 0949    Lipid Panel     Component Value Date/Time   CHOL 125 07/04/2023 1325   CHOL 135 02/28/2017 1604   TRIG 113 07/04/2023 1325   HDL 38 (L) 07/04/2023 1325   HDL 38 (L) 02/28/2017 1604   CHOLHDL 3.3 07/04/2023 1325   VLDL 34.0 12/08/2020 1432   LDLCALC 67 07/04/2023 1325    CBC    Component Value Date/Time   WBC 12.5 (H) 07/04/2023 1325   RBC 4.95 07/04/2023 1325   HGB 14.2 07/04/2023 1325   HGB 15.6 03/16/2018 1537   HCT 43.7 07/04/2023 1325   HCT 47.5 (H) 03/16/2018 1537   PLT 304 07/04/2023 1325   PLT 314 03/16/2018 1537   MCV 88.3 07/04/2023 1325   MCV 88 03/16/2018 1537   MCV 88 05/06/2012 0949   MCH 28.7 07/04/2023 1325   MCHC 32.5 07/04/2023 1325   RDW 13.3 07/04/2023 1325   RDW 14.1 03/16/2018 1537   RDW 13.7 05/06/2012 0949   LYMPHSABS 1.7 10/31/2022 0341   LYMPHSABS 5.1 (H) 02/28/2017 1604   MONOABS 0.7 10/31/2022 0341   EOSABS 0.0 10/31/2022 0341   EOSABS 0.1 02/28/2017 1604  BASOSABS 0.0 10/31/2022 0341   BASOSABS 0.1 02/28/2017 1604    Hgb A1C Lab Results  Component Value Date   HGBA1C 5.8 (H) 07/04/2023        Assessment and  Plan:  Assessment and Plan    Upper Respiratory Infection EVisit notes reviewed. Persistent cough and chest discomfort despite treatment with doxycycline and Tessalon. No productive cough, fever, or significant shortness of breath. History of pneumonia. -Start Prednisone and monitor for improvement. -If no improvement, patient to contact office on 10/30/2023.  COPD History of COPD with current exacerbation of cough and chest discomfort. No daily inhaler use. -Consider adding a daily inhaler if symptoms persist after current illness resolves.     RTC in 2 months, followup chronic conditions Follow Up Instructions:    I discussed the assessment and treatment plan with the patient. The patient was provided an opportunity to ask questions and all were answered. The patient agreed with the plan and demonstrated an understanding of the instructions.   The patient was advised to call back or seek an in-person evaluation if the symptoms worsen or if the condition fails to improve as anticipated.   Nicki Reaper, NP

## 2023-10-27 NOTE — Patient Outreach (Signed)
  Care Management   Visit Note  10/27/2023 Name: [name] MRN: [id number] DOB: 02-22-1961  Subjective: [name] is a 62 y.o. year old female who is a primary care patient of [name], NP. The Care Management team was consulted for assistance.      Call returned from Ms. Shankles requesting to reschedule care management outreach. Outreach rescheduled for November 18, 2023 at 2 pm.   PLAN Care management outreach rescheduled for November 18, 2023 at 2pm.   Juanell Fairly [facility]  [facility], [facility] RN Care Manager Direct Dial: [phone] Website: [email or web address]

## 2023-10-27 NOTE — Patient Outreach (Signed)
  Care Management   Outreach Note  10/27/2023 Name: Brittany Pruitt MRN: [id number] DOB: 1960/11/18  An unsuccessful outreach attempt was made today for a scheduled Care Management visit.    Follow Up Plan:  A HIPAA compliant phone message was left for the patient providing contact information and requesting a return call.     Katina Degree [facility]  [facility], [facility] RN Care Manager Direct Dial: [phone] Website: [email or web address]

## 2023-10-27 NOTE — Patient Instructions (Signed)

## 2023-11-10 NOTE — Telephone Encounter (Signed)
 PAP: Application for Ozempic has been submitted to PAP Companies: [employer], via fax

## 2023-11-11 ENCOUNTER — Encounter: Payer: Self-pay | Admitting: Internal Medicine

## 2023-11-12 NOTE — Telephone Encounter (Signed)
 PAP: Patient assistance application for Ozempic has been approved by [employer] Companies: [employer] from 11/11/2023 to 11/03/2024. Medication should be delivered to PAP Delivery: Provider's office For further shipping updates, please contact [employer] at [phone] Pt ID is: [id number]   PLEASE BE ADVISED LETTER OF APPROVAL IN MEDIA OF CHART

## 2023-11-18 ENCOUNTER — Telehealth: Payer: Self-pay

## 2023-11-18 ENCOUNTER — Other Ambulatory Visit: Payer: Self-pay

## 2023-11-18 NOTE — Patient Outreach (Signed)
  Care Management   Outreach Note  11/18/2023 Name: Brittany Pruitt MRN: [id number] DOB: January 08, 1961  An unsuccessful outreach attempt was made today for a scheduled Care Management visit.   Follow Up Plan:  A HIPAA compliant phone message was left for the patient providing contact information and requesting a return call.    [facility] RN Care Manager Direct Dial: [phone] Website: [email or web address]

## 2023-11-19 ENCOUNTER — Other Ambulatory Visit: Payer: Self-pay

## 2023-11-19 NOTE — Patient Outreach (Signed)
  Care Management   Visit Note  11/19/2023 Name: [name] MRN: [id number] DOB: [birth year]  Subjective: Brittany Pruitt is a 63 y.o. year old female who is a primary care patient of Carollynn Cirri, NP. The Care Management team was consulted for assistance.       Message received from Brittany Pruitt. Reports doing well. Requested to discontinue care management calls due to difficulties with speech. Denies urgent needs. Agreed to reach out via MyChart if she requires assistance   PLAN Brittany Pruitt will contact the clinic for assistance as needed.   [facility] RN Care Manager Direct Dial: [phone] Website: [email or web address]

## 2023-11-24 ENCOUNTER — Other Ambulatory Visit: Payer: Medicare Other

## 2023-11-28 NOTE — Telephone Encounter (Signed)
PAP: Patient assistance application for Ozempic has been approved by PAP Companies: [employer] from 11-05-2023 to 11-03-2024. Medication should be delivered to PAP Delivery: Provider's office. For further shipping updates, please [employer] at [phone]. Patient ID is: not provided/

## 2023-12-15 ENCOUNTER — Other Ambulatory Visit: Payer: Self-pay | Admitting: Family Medicine

## 2023-12-15 DIAGNOSIS — E1142 Type 2 diabetes mellitus with diabetic polyneuropathy: Secondary | ICD-10-CM

## 2023-12-16 NOTE — Telephone Encounter (Signed)
Reordered 07/11/23 #180 1 RF  Requested Prescriptions  Refused Prescriptions Disp Refills   glipiZIDE (GLUCOTROL) 5 MG tablet [Pharmacy Med Name: GLIPIZIDE TABS 5MG ] 180 tablet 3    Sig: TAKE 1 TABLET TWICE A DAY BEFORE MEALS (DOSE REDUCED)     Endocrinology:  Diabetes - Sulfonylureas Passed - 12/16/2023  8:46 AM      Passed - HBA1C is between 0 and 7.9 and within 180 days    Hgb A1c MFr Bld  Date Value Ref Range Status  07/04/2023 5.8 (H) <5.7 % of total Hgb Final    Comment:    For someone without known diabetes, a hemoglobin  A1c value between 5.7% and 6.4% is consistent with prediabetes and should be confirmed with a  follow-up test. . For someone with known diabetes, a value <7% indicates that their diabetes is well controlled. A1c targets should be individualized based on duration of diabetes, age, comorbid conditions, and other considerations. . This assay result is consistent with an increased risk of diabetes. . Currently, no consensus exists regarding use of hemoglobin A1c for diagnosis of diabetes for children. .          Passed - Cr in normal range and within 360 days    Creat  Date Value Ref Range Status  07/04/2023 0.80 0.50 - 1.05 mg/dL Final   Creatinine, Urine  Date Value Ref Range Status  12/13/2022 110 20 - 275 mg/dL Final         Passed - Valid encounter within last 6 months    Recent Outpatient Visits           1 month ago Upper respiratory infection with cough and congestion   [place] [facility] [place], [place] W, NP   3 months ago Type 2 diabetes mellitus with diabetic polyneuropathy, with long-term current use of insulin [facility])   [place] [facility] [place], Jackelyn Poling, RPH-CPP   5 months ago COVID-19   [facility] [place], [place] W, NP   5 months ago Type 2 diabetes mellitus with diabetic polyneuropathy, with long-term current use of insulin [facility])   [place] [facility] Delles, Jackelyn Poling, RPH-CPP   5 months ago Encounter for general adult medical examination with abnormal findings   [place] [facility] [place], Salvadore Oxford, NP       Future Appointments             In 2 weeks Sampson Si, Salvadore Oxford, NP [place] [facility], [facility]

## 2024-01-02 ENCOUNTER — Other Ambulatory Visit: Payer: Self-pay | Admitting: Internal Medicine

## 2024-01-02 ENCOUNTER — Ambulatory Visit: Payer: Medicare Other | Admitting: Internal Medicine

## 2024-01-02 DIAGNOSIS — R202 Paresthesia of skin: Secondary | ICD-10-CM

## 2024-01-02 DIAGNOSIS — F419 Anxiety disorder, unspecified: Secondary | ICD-10-CM

## 2024-01-02 DIAGNOSIS — F32A Depression, unspecified: Secondary | ICD-10-CM

## 2024-01-05 NOTE — Telephone Encounter (Signed)
 Requested Prescriptions  Pending Prescriptions Disp Refills   citalopram (CELEXA) 20 MG tablet [Pharmacy Med Name: CITALOPRAM HYDROBROMIDE TABS 20MG ] 90 tablet 0    Sig: TAKE 1 TABLET DAILY     Psychiatry:  Antidepressants - SSRI Passed - 01/05/2024 10:56 AM      Passed - Completed PHQ-2 or PHQ-9 in the last 360 days      Passed - Valid encounter within last 6 months    Recent Outpatient Visits           2 months ago Upper respiratory infection with cough and congestion   [facility] [place], [place] W, NP   4 months ago Type 2 diabetes mellitus with diabetic polyneuropathy, with long-term current use of insulin [facility])   [facility] Delles, Jackelyn Poling, RPH-CPP   5 months ago COVID-19   [facility] [place], [place] W, NP   5 months ago Type 2 diabetes mellitus with diabetic polyneuropathy, with long-term current use of insulin (HCC)   [facility] Delles, Gentry Fitz A, RPH-CPP   6 months ago Encounter for general adult medical examination with abnormal findings   [facility] [place], Salvadore Oxford, NP       Future Appointments             Tomorrow Sampson Si, Salvadore Oxford, NP [place] [facility]             meclizine (ANTIVERT) 25 MG tablet [Pharmacy Med Name: MECLIZINE TABS 25MG ] 30 tablet 35    Sig: TAKE 1 TABLET THREE TIMES A DAY AS NEEDED FOR DIZZINESS     Not Delegated - Gastroenterology: Antiemetics Failed - 01/05/2024 10:56 AM      Failed - This refill cannot be delegated      Passed - Valid encounter within last 6 months    Recent Outpatient Visits           2 months ago Upper respiratory infection with cough and congestion   [place] [facility] [place], [place] W, NP   4 months ago Type 2 diabetes mellitus with diabetic polyneuropathy, with long-term current use of insulin [facility])   [facility] Delles, Jackelyn Poling, RPH-CPP   5 months ago COVID-19   [facility] [place], [place] W, NP   5 months ago Type 2 diabetes mellitus with diabetic polyneuropathy, with long-term current use of insulin (HCC)   [place] [facility] Delles, Gentry Fitz A, RPH-CPP   6 months ago Encounter for general adult medical examination with abnormal findings   [facility] [place], Salvadore Oxford, NP       Future Appointments             Tomorrow Sampson Si, Salvadore Oxford, NP [place] [facility]             busPIRone (BUSPAR) 5 MG tablet [Pharmacy Med Name: BUSPIRONE HCL TABS 5MG ] 180 tablet 0    Sig: TAKE 1 TABLET TWICE A DAY     Psychiatry: Anxiolytics/Hypnotics - Non-controlled Passed - 01/05/2024 10:56 AM      Passed - Valid encounter within last 12 months    Recent Outpatient Visits           2 months ago Upper respiratory infection with cough and congestion   [facility] [place], [place], NP   4 months ago Type 2 diabetes mellitus with diabetic polyneuropathy, with long-term current use of insulin [facility])   [place] Delles, Jackelyn Poling, RPH-CPP   5 months ago COVID-19   [facility] [place], Salvadore Oxford, NP   5 months ago Type 2 diabetes mellitus with diabetic polyneuropathy, with long-term current use of insulin (HCC)   [place] [facility] Delles, Gentry Fitz A, RPH-CPP   6 months ago Encounter for general adult medical examination with abnormal findings   [facility] [place], Salvadore Oxford, NP       Future Appointments             Tomorrow Sampson Si, Salvadore Oxford, NP [facility], [facility]             lisinopril (ZESTRIL) 5 MG tablet [Pharmacy Med Name: LISINOPRIL TABS 5MG ] 90 tablet 0    Sig: TAKE 1 TABLET DAILY     Cardiovascular:  ACE Inhibitors Failed  - 01/05/2024 10:56 AM      Failed - Cr in normal range and within 180 days    Creat  Date Value Ref Range Status  07/04/2023 0.80 0.50 - 1.05 mg/dL Final   Creatinine, Urine  Date Value Ref Range Status  12/13/2022 110 20 - 275 mg/dL Final         Failed - K in normal range and within 180 days    Potassium  Date Value Ref Range Status  07/04/2023 4.6 3.5 - 5.3 mmol/L Final  05/06/2012 4.5 3.5 - 5.1 mmol/L Final         Passed - Patient is not pregnant      Passed - Last BP in normal range    BP Readings from Last 1 Encounters:  07/04/23 118/74         Passed - Valid encounter within last 6 months    Recent Outpatient Visits           2 months ago Upper respiratory infection with cough and congestion   [place] [facility] [place], [place] W, NP   4 months ago Type 2 diabetes mellitus with diabetic polyneuropathy, with long-term current use of insulin (HCC)   [place] [facility] Delles, Jackelyn Poling, RPH-CPP   5 months ago COVID-19   [facility] [place], [place], NP   5 months ago Type 2 diabetes mellitus with diabetic polyneuropathy, with long-term current use of insulin (HCC)   [place] [facility] Delles, Gentry Fitz A, RPH-CPP   6 months ago Encounter for general adult medical examination with abnormal findings   [place] [facility] [place], Salvadore Oxford, NP       Future Appointments             Tomorrow Sampson Si, Salvadore Oxford, NP [facility]             simvastatin (ZOCOR) 20 MG tablet [Pharmacy Med Name: SIMVASTATIN TABS 20MG ] 90 tablet 0    Sig: TAKE 1 TABLET DAILY AT 6 P.M.     Cardiovascular:  Antilipid - Statins Failed - 01/05/2024 10:56 AM      Failed - Lipid Panel in normal range within the last 12 months    Cholesterol, Total  Date Value Ref Range Status  02/28/2017 135 100 - 199 mg/dL Final   Cholesterol  Date Value Ref Range  Status  07/04/2023 125 <200 mg/dL Final   LDL Cholesterol (Calc)  Date Value Ref Range Status  07/04/2023 67 mg/dL (calc) Final    Comment:    Reference range: <100 . Desirable range <100 mg/dL for primary prevention;   <70 mg/dL for patients with CHD or diabetic patients  with > or = 2 CHD risk factors. Marland Kitchen LDL-C is now calculated using the Martin-Hopkins  calculation, which is a validated novel method providing  better accuracy than the Friedewald equation in the  estimation of LDL-C.  Horald Pollen et al. Lenox Ahr. 7829;562(13): 2061-2068  (http://education.[employer].com/faq/FAQ164)    HDL  Date Value Ref Range Status  07/04/2023 38 (L) > OR = 50 mg/dL Final  08/65/7846 38 (L) >39 mg/dL Final   Triglycerides  Date Value Ref Range Status  07/04/2023 113 <150 mg/dL Final         Passed - Patient is not pregnant      Passed - Valid encounter within last 12 months    Recent Outpatient Visits           2 months ago Upper respiratory infection with cough and congestion   [place] [facility] [place], [place], NP   4 months ago Type 2 diabetes mellitus with diabetic polyneuropathy, with long-term current use of insulin [facility])   [facility] Delles, Jackelyn Poling, RPH-CPP   5 months ago COVID-19   [facility], [place] W, NP   5 months ago Type 2 diabetes mellitus with diabetic polyneuropathy, with long-term current use of insulin (HCC)   [facility] Delles, Gentry Fitz A, RPH-CPP   6 months ago Encounter for general adult medical examination with abnormal findings   [place] [place], Salvadore Oxford, NP       Future Appointments             Tomorrow Sampson Si, Salvadore Oxford, NP [facility], [facility]             clonazePAM (KLONOPIN) 0.5 MG tablet [Pharmacy Med Name: CLONAZEPAM TABS 0.5MG ] 45 tablet 0    Sig: TAKE ONE-HALF (1/2)  TO ONE TABLET TWICE A DAY AS NEEDED FOR ANXIETY     Not Delegated - Psychiatry: Anxiolytics/Hypnotics 2 Failed - 01/05/2024 10:56 AM      Failed - This refill cannot be delegated      Failed - Urine Drug Screen completed in last 360 days      Passed - Patient is not pregnant      Passed - Valid encounter within last 6 months    Recent Outpatient Visits           2 months ago Upper respiratory infection with cough and congestion   [place] [facility] [place], [place], NP   4 months ago Type 2 diabetes mellitus with diabetic polyneuropathy, with long-term current use of insulin [facility])   [facility] D[facility], Jackelyn Poling, RPH-CPP   5 months ago COVID-19   [facility] [place], [place], NP   5 months ago Type 2 diabetes mellitus with diabetic polyneuropathy, with long-term current use of insulin [facility])   [place] [facility] Delles, Gentry Fitz A, RPH-CPP   6 months ago Encounter for general adult  medical examination with abnormal findings   [place] [facility] [place], Salvadore Oxford, NP       Future Appointments             Tomorrow Sampson Si, Salvadore Oxford, NP [place] [facility]             gabapentin (NEURONTIN) 300 MG capsule [Pharmacy Med Name: GABAPENTIN CAPS 300MG ] 360 capsule 0    Sig: TAKE 1 CAPSULE FOUR TIMES A DAY     Neurology: Anticonvulsants - gabapentin Passed - 01/05/2024 10:56 AM      Passed - Cr in normal range and within 360 days    Creat  Date Value Ref Range Status  07/04/2023 0.80 0.50 - 1.05 mg/dL Final   Creatinine, Urine  Date Value Ref Range Status  12/13/2022 110 20 - 275 mg/dL Final         Passed - Completed PHQ-2 or PHQ-9 in the last 360 days      Passed - Valid encounter within last 12 months    Recent Outpatient Visits           2 months ago Upper respiratory infection with cough and congestion   [place] [place], [place], NP   4 months ago Type 2 diabetes mellitus with diabetic polyneuropathy, with long-term current use of insulin [facility])   [place] [facility] Delles, Jackelyn Poling, RPH-CPP   5 months ago COVID-19   [facility] [place], [place], NP   5 months ago Type 2 diabetes mellitus with diabetic polyneuropathy, with long-term current use of insulin [facility])   [place] [facility] Delles, Gentry Fitz A, RPH-CPP   6 months ago Encounter for general adult medical examination with abnormal findings   [facility] [place], Salvadore Oxford, NP       Future Appointments             Tomorrow Sampson Si, Salvadore Oxford, NP  [facility]

## 2024-01-05 NOTE — Telephone Encounter (Signed)
 Requested medication (s) are due for refill today -yes  Requested medication (s) are on the active medication list -yes  Future visit scheduled -yes  Last refill: meclizine 08/19/23 #30                  Clonazepam 08/19/23 #45  Notes to clinic: non delegated Rx  Requested Prescriptions  Pending Prescriptions Disp Refills   meclizine (ANTIVERT) 25 MG tablet [Pharmacy Med Name: MECLIZINE TABS 25MG ] 30 tablet 35    Sig: TAKE 1 TABLET THREE TIMES A DAY AS NEEDED FOR DIZZINESS     Not Delegated - Gastroenterology: Antiemetics Failed - 01/05/2024 10:57 AM      Failed - This refill cannot be delegated      Passed - Valid encounter within last 6 months    Recent Outpatient Visits           2 months ago Upper respiratory infection with cough and congestion   [facility] [place], [place] W, NP   4 months ago Type 2 diabetes mellitus with diabetic polyneuropathy, with long-term current use of insulin [facility])   [facility] Delles, Gentry Fitz A, RPH-CPP   5 months ago COVID-19   [facility] [place], [place] W, NP   5 months ago Type 2 diabetes mellitus with diabetic polyneuropathy, with long-term current use of insulin (HCC)   [place] [facility] Delles, Gentry Fitz A, RPH-CPP   6 months ago Encounter for general adult medical examination with abnormal findings   [facility] [place], Salvadore Oxford, NP       Future Appointments             Tomorrow Sampson Si, Salvadore Oxford, NP [facility], [facility]             clonazePAM (KLONOPIN) 0.5 MG tablet [Pharmacy Med Name: CLONAZEPAM TABS 0.5MG ] 45 tablet 0    Sig: TAKE ONE-HALF (1/2) TO ONE TABLET TWICE A DAY AS NEEDED FOR ANXIETY     Not Delegated - Psychiatry: Anxiolytics/Hypnotics 2 Failed - 01/05/2024 10:57 AM      Failed - This refill cannot be delegated      Failed - Urine Drug Screen completed in last 360  days      Passed - Patient is not pregnant      Passed - Valid encounter within last 6 months    Recent Outpatient Visits           2 months ago Upper respiratory infection with cough and congestion   [place] [facility] [place], [place] W, NP   4 months ago Type 2 diabetes mellitus with diabetic polyneuropathy, with long-term current use of insulin [facility])   [place] Delles, Jackelyn Poling, RPH-CPP   5 months ago COVID-19   [facility] [place], [place] W, NP   5 months ago Type 2 diabetes mellitus with diabetic polyneuropathy, with long-term current use of insulin [facility])   [facility] Delles, Gentry Fitz A, RPH-CPP   6 months ago Encounter for general adult medical examination with abnormal findings   [place] [place], Salvadore Oxford, NP       Future Appointments             Tomorrow Sampson Si, Salvadore Oxford, NP  [facility]  Signed Prescriptions Disp Refills   citalopram (CELEXA) 20 MG tablet 90 tablet 0    Sig: TAKE 1 TABLET DAILY     Psychiatry:  Antidepressants - SSRI Passed - 01/05/2024 10:57 AM      Passed - Completed PHQ-2 or PHQ-9 in the last 360 days      Passed - Valid encounter within last 6 months    Recent Outpatient Visits           2 months ago Upper respiratory infection with cough and congestion   [place] [facility] [place], [place] W, NP   4 months ago Type 2 diabetes mellitus with diabetic polyneuropathy, with long-term current use of insulin [facility])   [facility] Delles, Gentry Fitz A, RPH-CPP   5 months ago COVID-19   [facility] [place], [place] W, NP   5 months ago Type 2 diabetes mellitus with diabetic polyneuropathy, with long-term current use of insulin (HCC)   [facility] Delles, Gentry Fitz A, RPH-CPP    6 months ago Encounter for general adult medical examination with abnormal findings   [facility], Salvadore Oxford, NP       Future Appointments             Tomorrow Sampson Si, Salvadore Oxford, NP [place] [facility], [facility]             busPIRone (BUSPAR) 5 MG tablet 180 tablet 0    Sig: TAKE 1 TABLET TWICE A DAY     Psychiatry: Anxiolytics/Hypnotics - Non-controlled Passed - 01/05/2024 10:57 AM      Passed - Valid encounter within last 12 months    Recent Outpatient Visits           2 months ago Upper respiratory infection with cough and congestion   [place] [place], [place] W, NP   4 months ago Type 2 diabetes mellitus with diabetic polyneuropathy, with long-term current use of insulin (HCC)   [facility] Delles, Gentry Fitz A, RPH-CPP   5 months ago COVID-19   [facility] [place], [place] W, NP   5 months ago Type 2 diabetes mellitus with diabetic polyneuropathy, with long-term current use of insulin (HCC)   [facility] Delles, Gentry Fitz A, RPH-CPP   6 months ago Encounter for general adult medical examination with abnormal findings   [facility] [place], Salvadore Oxford, NP       Future Appointments             Tomorrow Sampson Si, Salvadore Oxford, NP [facility], [facility]             lisinopril (ZESTRIL) 5 MG tablet 90 tablet 0    Sig: TAKE 1 TABLET DAILY     Cardiovascular:  ACE Inhibitors Failed - 01/05/2024 10:57 AM      Failed - Cr in normal range and within 180 days    Creat  Date Value Ref Range Status  07/04/2023 0.80 0.50 - 1.05 mg/dL Final   Creatinine, Urine  Date Value Ref Range Status  12/13/2022 110 20 - 275 mg/dL Final         Failed - K in normal range and within 180 days    Potassium  Date Value Ref Range Status  07/04/2023 4.6 3.5 - 5.3  mmol/L Final  05/06/2012  4.5 3.5 - 5.1 mmol/L Final         Passed - Patient is not pregnant      Passed - Last BP in normal range    BP Readings from Last 1 Encounters:  07/04/23 118/74         Passed - Valid encounter within last 6 months    Recent Outpatient Visits           2 months ago Upper respiratory infection with cough and congestion   [facility] [place], [place], NP   4 months ago Type 2 diabetes mellitus with diabetic polyneuropathy, with long-term current use of insulin [facility])   [place] [facility] [facility] Delles, Jackelyn Poling, RPH-CPP   5 months ago COVID-19   [facility] [place], [place] W, NP   5 months ago Type 2 diabetes mellitus with diabetic polyneuropathy, with long-term current use of insulin [facility])   [facility] Delles, Gentry Fitz A, RPH-CPP   6 months ago Encounter for general adult medical examination with abnormal findings   [facility] [place], Salvadore Oxford, NP       Future Appointments             Tomorrow Sampson Si, Salvadore Oxford, NP [place], [facility]             simvastatin (ZOCOR) 20 MG tablet 90 tablet 0    Sig: TAKE 1 TABLET DAILY AT 6 P.M.     Cardiovascular:  Antilipid - Statins Failed - 01/05/2024 10:57 AM      Failed - Lipid Panel in normal range within the last 12 months    Cholesterol, Total  Date Value Ref Range Status  02/28/2017 135 100 - 199 mg/dL Final   Cholesterol  Date Value Ref Range Status  07/04/2023 125 <200 mg/dL Final   LDL Cholesterol (Calc)  Date Value Ref Range Status  07/04/2023 67 mg/dL (calc) Final    Comment:    Reference range: <100 . Desirable range <100 mg/dL for primary prevention;   <70 mg/dL for patients with CHD or diabetic patients  with > or = 2 CHD risk factors. Marland Kitchen LDL-C is now calculated using the Martin-Hopkins  calculation, which is a validated novel method providing  better accuracy  than the Friedewald equation in the  estimation of LDL-C.  Horald Pollen et al. Lenox Ahr. 9563;875(64): 2061-2068  (http://education.[employer].com/faq/FAQ164)    HDL  Date Value Ref Range Status  07/04/2023 38 (L) > OR = 50 mg/dL Final  33/29/5188 38 (L) >39 mg/dL Final   Triglycerides  Date Value Ref Range Status  07/04/2023 113 <150 mg/dL Final         Passed - Patient is not pregnant      Passed - Valid encounter within last 12 months    Recent Outpatient Visits           2 months ago Upper respiratory infection with cough and congestion   [place] [facility] [place], [place] W, NP   4 months ago Type 2 diabetes mellitus with diabetic polyneuropathy, with long-term current use of insulin [facility])   [facility] Delles, Jackelyn Poling, RPH-CPP   5 months ago COVID-19   [facility] [place], [place] W, NP   5 months ago Type 2 diabetes mellitus with diabetic  polyneuropathy, with long-term current use of insulin (HCC)   [facility] Delles, Jackelyn Poling, RPH-CPP   6 months ago Encounter for general adult medical examination with abnormal findings   [facility] [place], Salvadore Oxford, NP       Future Appointments             Tomorrow Sampson Si, Salvadore Oxford, NP [place], [facility]             gabapentin (NEURONTIN) 300 MG capsule 360 capsule 0    Sig: TAKE 1 CAPSULE FOUR TIMES A DAY     Neurology: Anticonvulsants - gabapentin Passed - 01/05/2024 10:57 AM      Passed - Cr in normal range and within 360 days    Creat  Date Value Ref Range Status  07/04/2023 0.80 0.50 - 1.05 mg/dL Final   Creatinine, Urine  Date Value Ref Range Status  12/13/2022 110 20 - 275 mg/dL Final         Passed - Completed PHQ-2 or PHQ-9 in the last 360 days      Passed - Valid encounter within last 12 months    Recent Outpatient Visits           2 months ago  Upper respiratory infection with cough and congestion   [place] [facility] [place], [place] W, NP   4 months ago Type 2 diabetes mellitus with diabetic polyneuropathy, with long-term current use of insulin (HCC)   [place] [facility] Delles, Gentry Fitz A, RPH-CPP   5 months ago COVID-19   [facility] [place], [place] W, NP   5 months ago Type 2 diabetes mellitus with diabetic polyneuropathy, with long-term current use of insulin (HCC)   [facility] Delles, Gentry Fitz A, RPH-CPP   6 months ago Encounter for general adult medical examination with abnormal findings   [place] [facility] [place], Salvadore Oxford, NP       Future Appointments             Tomorrow Sampson Si, Salvadore Oxford, NP [place] [facility]               Requested Prescriptions  Pending Prescriptions Disp Refills   meclizine (ANTIVERT) 25 MG tablet [Pharmacy Med Name: MECLIZINE TABS 25MG ] 30 tablet 35    Sig: TAKE 1 TABLET THREE TIMES A DAY AS NEEDED FOR DIZZINESS     Not Delegated - Gastroenterology: Antiemetics Failed - 01/05/2024 10:57 AM      Failed - This refill cannot be delegated      Passed - Valid encounter within last 6 months    Recent Outpatient Visits           2 months ago Upper respiratory infection with cough and congestion   [place] [facility] [place], [place] W, NP   4 months ago Type 2 diabetes mellitus with diabetic polyneuropathy, with long-term current use of insulin [facility])   [facility] Delles, Jackelyn Poling, RPH-CPP   5 months ago COVID-19   [facility] [place], [place] W, NP   5 months ago Type 2 diabetes mellitus with diabetic polyneuropathy, with long-term current use of insulin [facility])   [place] [facility] Delles, Gentry Fitz A, RPH-CPP   6 months ago Encounter for general adult  medical examination with abnormal findings   [place] [facility] [place], Salvadore Oxford, NP       Future Appointments             Tomorrow Sampson Si, Salvadore Oxford, NP [place] [facility], [facility]             clonazePAM (KLONOPIN) 0.5 MG tablet [Pharmacy Med Name: CLONAZEPAM TABS 0.5MG ] 45 tablet 0    Sig: TAKE ONE-HALF (1/2) TO ONE TABLET TWICE A DAY AS NEEDED FOR ANXIETY     Not Delegated - Psychiatry: Anxiolytics/Hypnotics 2 Failed - 01/05/2024 10:57 AM      Failed - This refill cannot be delegated      Failed - Urine Drug Screen completed in last 360 days      Passed - Patient is not pregnant      Passed - Valid encounter within last 6 months    Recent Outpatient Visits           2 months ago Upper respiratory infection with cough and congestion   [facility] [place], [place] W, NP   4 months ago Type 2 diabetes mellitus with diabetic polyneuropathy, with long-term current use of insulin (HCC)   [facility] D[facility], Gentry Fitz A, RPH-CPP   5 months ago COVID-19   [facility] [place], [place] W, NP   5 months ago Type 2 diabetes mellitus with diabetic polyneuropathy, with long-term current use of insulin (HCC)   [place] [facility] Delles, Gentry Fitz A, RPH-CPP   6 months ago Encounter for general adult medical examination with abnormal findings   [place] [facility] [place], Salvadore Oxford, NP       Future Appointments             Tomorrow Sampson Si, Salvadore Oxford, NP [place] [facility], [facility]            Signed Prescriptions Disp Refills   citalopram (CELEXA) 20 MG tablet 90 tablet 0    Sig: TAKE 1 TABLET DAILY     Psychiatry:  Antidepressants - SSRI Passed - 01/05/2024 10:57 AM      Passed - Completed PHQ-2 or PHQ-9 in the last 360 days      Passed - Valid encounter within last 6 months    Recent Outpatient Visits            2 months ago Upper respiratory infection with cough and congestion   [place] [facility] [place], [place] W, NP   4 months ago Type 2 diabetes mellitus with diabetic polyneuropathy, with long-term current use of insulin [facility])   [place] [facility] Delles, Jackelyn Poling, RPH-CPP   5 months ago COVID-19   [facility] [place], [place] W, NP   5 months ago Type 2 diabetes mellitus with diabetic polyneuropathy, with long-term current use of insulin [facility])   [place] [facility] [facility] Delles, Gentry Fitz A, RPH-CPP   6 months ago Encounter for general adult medical examination with abnormal findings   [place] [facility] [place], Salvadore Oxford, NP       Future Appointments             Tomorrow Sampson Si, Salvadore Oxford, NP [place] [facility], [facility]             busPIRone (  BUSPAR) 5 MG tablet 180 tablet 0    Sig: TAKE 1 TABLET TWICE A DAY     Psychiatry: Anxiolytics/Hypnotics - Non-controlled Passed - 01/05/2024 10:57 AM      Passed - Valid encounter within last 12 months    Recent Outpatient Visits           2 months ago Upper respiratory infection with cough and congestion   [place] [facility] [place], [place] W, NP   4 months ago Type 2 diabetes mellitus with diabetic polyneuropathy, with long-term current use of insulin (HCC)   [place] [facility] Delles, Gentry Fitz A, RPH-CPP   5 months ago COVID-19   [facility] [place], Salvadore Oxford, NP   5 months ago Type 2 diabetes mellitus with diabetic polyneuropathy, with long-term current use of insulin (HCC)   [place] [facility] Delles, Gentry Fitz A, RPH-CPP   6 months ago Encounter for general adult medical examination with abnormal findings   [place] [facility] [place], Salvadore Oxford, NP       Future Appointments             Tomorrow Sampson Si,  Salvadore Oxford, NP [place] [facility], [facility]             lisinopril (ZESTRIL) 5 MG tablet 90 tablet 0    Sig: TAKE 1 TABLET DAILY     Cardiovascular:  ACE Inhibitors Failed - 01/05/2024 10:57 AM      Failed - Cr in normal range and within 180 days    Creat  Date Value Ref Range Status  07/04/2023 0.80 0.50 - 1.05 mg/dL Final   Creatinine, Urine  Date Value Ref Range Status  12/13/2022 110 20 - 275 mg/dL Final         Failed - K in normal range and within 180 days    Potassium  Date Value Ref Range Status  07/04/2023 4.6 3.5 - 5.3 mmol/L Final  05/06/2012 4.5 3.5 - 5.1 mmol/L Final         Passed - Patient is not pregnant      Passed - Last BP in normal range    BP Readings from Last 1 Encounters:  07/04/23 118/74         Passed - Valid encounter within last 6 months    Recent Outpatient Visits           2 months ago Upper respiratory infection with cough and congestion   [place] [facility] [place], [place] W, NP   4 months ago Type 2 diabetes mellitus with diabetic polyneuropathy, with long-term current use of insulin [facility])   [place] [facility] Delles, Jackelyn Poling, RPH-CPP   5 months ago COVID-19   [facility] [place], [place] W, NP   5 months ago Type 2 diabetes mellitus with diabetic polyneuropathy, with long-term current use of insulin [facility])   [facility] Delles, Gentry Fitz A, RPH-CPP   6 months ago Encounter for general adult medical examination with abnormal findings   [facility] [place], Salvadore Oxford, NP       Future Appointments             Tomorrow Sampson Si, Salvadore Oxford, NP  [facility], [facility]             simvastatin [place])  20 MG tablet 90 tablet 0    Sig: TAKE 1 TABLET DAILY AT 6 P.M.     Cardiovascular:  Antilipid - Statins Failed - 01/05/2024 10:57 AM      Failed - Lipid Panel in normal range  within the last 12 months    Cholesterol, Total  Date Value Ref Range Status  02/28/2017 135 100 - 199 mg/dL Final   Cholesterol  Date Value Ref Range Status  07/04/2023 125 <200 mg/dL Final   LDL Cholesterol (Calc)  Date Value Ref Range Status  07/04/2023 67 mg/dL (calc) Final    Comment:    Reference range: <100 . Desirable range <100 mg/dL for primary prevention;   <70 mg/dL for patients with CHD or diabetic patients  with > or = 2 CHD risk factors. Marland Kitchen LDL-C is now calculated using the Martin-Hopkins  calculation, which is a validated novel method providing  better accuracy than the Friedewald equation in the  estimation of LDL-C.  Horald Pollen et al. [place]ox Ahr. 0865;784(69): 2061-2068  (http://education.[facility].com/faq/FAQ164)    HDL  Date Value Ref Range Status  07/04/2023 38 (L) > OR = 50 mg/dL Final  62/95/2841 38 (L) >39 mg/dL Final   Triglycerides  Date Value Ref Range Status  07/04/2023 113 <150 mg/dL Final         Passed - Patient is not pregnant      Passed - Valid encounter within last 12 months    Recent Outpatient Visits           2 months ago Upper respiratory infection with cough and congestion   [place] [facility] [place], [place] W, NP   4 months ago Type 2 diabetes mellitus with diabetic polyneuropathy, with long-term current use of insulin [facility])   [facility] Delles, Jackelyn Poling, RPH-CPP   5 months ago COVID-19   [facility] [place], [place] W, NP   5 months ago Type 2 diabetes mellitus with diabetic polyneuropathy, with long-term current use of insulin (HCC)   [facility] Delles, Gentry Fitz A, RPH-CPP   6 months ago Encounter for general adult medical examination with abnormal findings   [facility] [place], Salvadore Oxford, NP       Future Appointments             Tomorrow Sampson Si, Salvadore Oxford, NP [facility], [facility]             gabapentin (NEURONTIN) 300 MG capsule 360 capsule 0    Sig: TAKE 1 CAPSULE FOUR TIMES A DAY     Neurology: Anticonvulsants - gabapentin Passed - 01/05/2024 10:57 AM      Passed - Cr in normal range and within 360 days    Creat  Date Value Ref Range Status  07/04/2023 0.80 0.50 - 1.05 mg/dL Final   Creatinine, Urine  Date Value Ref Range Status  12/13/2022 110 20 - 275 mg/dL Final         Passed - Completed PHQ-2 or PHQ-9 in the last 360 days      Passed - Valid encounter within last 12 months    Recent Outpatient Visits           2 months ago Upper respiratory infection with cough and congestion   [facility] [place], [place] W, NP   4 months ago Type 2 diabetes mellitus with diabetic  polyneuropathy, with long-term current use of insulin [facility])   [facility] Delles, Jackelyn Poling, RPH-CPP   5 months ago COVID-19   [place] [facility] [place], [place] W, NP   5 months ago Type 2 diabetes mellitus with diabetic polyneuropathy, with long-term current use of insulin [facility])   [place] [facility] Delles, Jackelyn Poling, RPH-CPP   6 months ago Encounter for general adult medical examination with abnormal findings   [place] [facility] [place], Salvadore Oxford, NP       Future Appointments             Tomorrow Sampson Si, Salvadore Oxford, NP  [facility]

## 2024-01-06 ENCOUNTER — Encounter: Payer: Self-pay | Admitting: Internal Medicine

## 2024-01-06 ENCOUNTER — Ambulatory Visit (INDEPENDENT_AMBULATORY_CARE_PROVIDER_SITE_OTHER): Payer: Medicare Other | Admitting: Internal Medicine

## 2024-01-06 VITALS — BP 118/68 | Ht 63.5 in | Wt 160.2 lb

## 2024-01-06 DIAGNOSIS — K219 Gastro-esophageal reflux disease without esophagitis: Secondary | ICD-10-CM

## 2024-01-06 DIAGNOSIS — Z794 Long term (current) use of insulin: Secondary | ICD-10-CM

## 2024-01-06 DIAGNOSIS — M8589 Other specified disorders of bone density and structure, multiple sites: Secondary | ICD-10-CM

## 2024-01-06 DIAGNOSIS — J383 Other diseases of vocal cords: Secondary | ICD-10-CM

## 2024-01-06 DIAGNOSIS — E1142 Type 2 diabetes mellitus with diabetic polyneuropathy: Secondary | ICD-10-CM | POA: Diagnosis not present

## 2024-01-06 DIAGNOSIS — I10 Essential (primary) hypertension: Secondary | ICD-10-CM

## 2024-01-06 DIAGNOSIS — D72829 Elevated white blood cell count, unspecified: Secondary | ICD-10-CM

## 2024-01-06 DIAGNOSIS — R519 Headache, unspecified: Secondary | ICD-10-CM

## 2024-01-06 DIAGNOSIS — E1169 Type 2 diabetes mellitus with other specified complication: Secondary | ICD-10-CM

## 2024-01-06 DIAGNOSIS — J432 Centrilobular emphysema: Secondary | ICD-10-CM

## 2024-01-06 DIAGNOSIS — F32A Depression, unspecified: Secondary | ICD-10-CM

## 2024-01-06 DIAGNOSIS — E663 Overweight: Secondary | ICD-10-CM

## 2024-01-06 DIAGNOSIS — E785 Hyperlipidemia, unspecified: Secondary | ICD-10-CM

## 2024-01-06 DIAGNOSIS — F419 Anxiety disorder, unspecified: Secondary | ICD-10-CM

## 2024-01-06 DIAGNOSIS — E349 Endocrine disorder, unspecified: Secondary | ICD-10-CM

## 2024-01-06 DIAGNOSIS — H8103 Meniere's disease, bilateral: Secondary | ICD-10-CM

## 2024-01-06 MED ORDER — ASPIRIN 81 MG PO TBEC: 81.0000 mg | DELAYED_RELEASE_TABLET | Freq: Every day | ORAL | Status: AC

## 2024-01-06 NOTE — Assessment & Plan Note (Signed)
CBC today Encourage smoking cessation

## 2024-01-06 NOTE — Assessment & Plan Note (Signed)
Continue calcium and vitamin D Encourage daily weightbearing exercise 

## 2024-01-06 NOTE — Patient Instructions (Signed)

## 2024-01-06 NOTE — Assessment & Plan Note (Signed)
 Continue meclizine as needed.

## 2024-01-06 NOTE — Assessment & Plan Note (Signed)
 Continue clonazepam as needed

## 2024-01-06 NOTE — Assessment & Plan Note (Signed)
Encourage stress reduction techniques Continue Tylenol as needed 

## 2024-01-06 NOTE — Assessment & Plan Note (Signed)
Avoid foods that trigger reflux Encourage weight loss as this can produce reflux symptoms Continue omeprazole 

## 2024-01-06 NOTE — Assessment & Plan Note (Signed)
C-Met and lipid profile today Encouraged her to consume low-fat diet Continue simvastatin and aspirin

## 2024-01-06 NOTE — Assessment & Plan Note (Signed)
 Encourage smoking cessation Not using any inhalers

## 2024-01-06 NOTE — Assessment & Plan Note (Signed)
 Encouraged diet and exercise for weight loss ?

## 2024-01-06 NOTE — Assessment & Plan Note (Signed)
 Controlled on lisinopril Reinforced DASH diet and exercise for weight loss C-Met today

## 2024-01-06 NOTE — Progress Notes (Signed)
 Subjective:    Patient ID: Brittany Pruitt, female    DOB: 1961-08-23, 63 y.o.   MRN: [id number]  HPI  Patient presents to clinic today for 52-month follow-up of chronic conditions.  Anxiety and depression: Chronic, managed on citalopram, buspirone and clonazepam.  She is not currently seeing a therapist.  She denies SI/HI.  DM2 with peripheral neuropathy: Her last A1c was 5.8%, 06/2023.  She is taking glipizide, lantus, ozempic and gabapentin as prescribed.  Her sugars range 61-185.  She checks her feet routinely.  Her last eye exam was 06/2023.  Flu 06/2023.  Pneumovax 06/2019.  COVID Pfizer x 2.  HTN: Her BP today is 118/68.  She is taking lisinopril as prescribed.  ECG from 10/2022 reviewed.  COPD: She denies chronic cough or shortness of breath.  She is not using any inhalers at this time.  She does not follow with pulmonology.  There are no PFTs on file.  HLD with aortic atherosclerosis: Her last LDL was 67, triglycerides 409, 06/2019:.  She denies myalgias on simvastatin.  She is currently taking aspirin.  She tries to consume a low-fat diet.  Frequent headaches: Triggered by stress.  She takes tylenol as needed with good relief of symptoms.  She does not follow with neurology.  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  She does not follow with ENT currently.  GERD: She is not sure what triggers this.  She denies breakthrough on omeprazole.  There is no upper GI on file.  Mnire's Disease: Intermittent dizzinsess.  She takes meclizine as needed.  She does not follow with ENT.  Leukocytosis: Her last WBC count was 12.5, 8/20204.  She does smoke.  She does not follow with hematology.  Osteopenia: She is taking calcium and vitamin d OTC.  She tries to get weightbearing exercise daily.  Bone density from 09/2021 reviewed.  Review of Systems   Past Medical History:  Diagnosis Date   Anxiety    Chicken pox    Depression    Diabetes mellitus without complication  (HCC)    Frequent headaches    Hyperlipidemia     Current Outpatient Medications  Medication Sig Dispense Refill   acetaminophen (TYLENOL) 500 MG tablet Take 500 mg by mouth every 6 (six) hours as needed for moderate pain.     albuterol (VENTOLIN HFA) 108 (90 Base) MCG/ACT inhaler Inhale 2 puffs into the lungs every 6 (six) hours as needed for wheezing or shortness of breath. 8 g 2   benzonatate (TESSALON) 100 MG capsule Take 1 capsule (100 mg total) by mouth 3 (three) times daily as needed for cough. 30 capsule 0   busPIRone (BUSPAR) 5 MG tablet TAKE 1 TABLET TWICE A DAY 180 tablet 0   Cholecalciferol (VITAMIN D3) 50 MCG (2000 UT) capsule Take 2,000 Units by mouth daily.     citalopram (CELEXA) 20 MG tablet TAKE 1 TABLET DAILY 90 tablet 0   clonazePAM (KLONOPIN) 0.5 MG tablet TAKE ONE-HALF (1/2) TO ONE TABLET TWICE A DAY AS NEEDED FOR ANXIETY 45 tablet 0   cyanocobalamin (VITAMIN B12) 1000 MCG tablet Take 1,000 mcg by mouth daily.     doxycycline (VIBRA-TABS) 100 MG tablet Take 1 tablet (100 mg total) by mouth 2 (two) times daily. 14 tablet 0   gabapentin (NEURONTIN) 300 MG capsule TAKE 1 CAPSULE FOUR TIMES A DAY 360 capsule 0   glipiZIDE (GLUCOTROL) 5 MG tablet TAKE 1 TABLET TWICE A DAY BEFORE MEALS 180 tablet 1  LANTUS SOLOSTAR 100 UNIT/ML Solostar Pen INJECT 35 UNITS UNDER THE SKIN TWICE A DAY 60 mL 3   lisinopril (ZESTRIL) 5 MG tablet TAKE 1 TABLET DAILY 90 tablet 0   meclizine (ANTIVERT) 25 MG tablet TAKE 1 TABLET THREE TIMES A DAY AS NEEDED FOR DIZZINESS 30 tablet 35   Multiple Vitamins-Minerals (CENTRUM SILVER 50+WOMEN PO) Take 1 tablet by mouth daily.     Omega-3 Fatty Acids (FISH OIL) 1000 MG CAPS Take 1 capsule by mouth in the morning and at bedtime.     omeprazole (PRILOSEC) 20 MG capsule TAKE 1 CAPSULE DAILY 90 capsule 2   predniSONE (DELTASONE) 10 MG tablet Take 6 tabs on day 1, 5 tabs on day 2, 4 tabs on day 3, 3 tabs on day 4, 2 tabs on day 5, 1 tab on day 6 21 tablet 0    Semaglutide, 1 MG/DOSE, (OZEMPIC, 1 MG/DOSE,) 4 MG/3ML SOPN Inject 1 mg into the skin once a week.     simvastatin (ZOCOR) 20 MG tablet TAKE 1 TABLET DAILY AT 6 P.M. 90 tablet 0   No current facility-administered medications for this visit.    Allergies  Allergen Reactions   Sulfa Antibiotics Hives    Family History  Problem Relation Age of Onset   Uterine cancer Mother    Breast cancer Mother 2   Pancreatic cancer Mother    Heart disease Father    Hypertension Father    Anxiety disorder Father    Diabetes Father    Uterine cancer Maternal Aunt    Breast cancer Maternal Aunt 14   Heart disease Paternal Grandfather     Social History   Socioeconomic History   Marital status: Married    Spouse name: Darrin Apodaca   Number of children: Not on file   Years of education: Not on file   Highest education level: 12th grade  Occupational History   Not on file  Tobacco Use   Smoking status: Former    Current packs/day: 0.00    Average packs/day: 1 pack/day for 30.0 years (30.0 ttl pk-yrs)    Types: Cigarettes    Quit date: 01/14/2023    Years since quitting: 0.9   Smokeless tobacco: Never  Vaping Use   Vaping status: Never Used  Substance and Sexual Activity   Alcohol use: No    Alcohol/week: 0.0 standard drinks of alcohol   Drug use: Never   Sexual activity: Yes    Partners: Male  Other Topics Concern   Not on file  Social History Narrative   Pt lives with husband Casimiro Needle.  Is not currently working.    Social Drivers of Corporate investment banker Strain: Low Risk  (01/05/2024)   Overall Financial Resource Strain (CARDIA)    Difficulty of Paying Living Expenses: Not very hard  Food Insecurity: No Food Insecurity (01/05/2024)   Hunger Vital Sign    Worried About Running Out of Food in the Last Year: Never true    Ran Out of Food in the Last Year: Never true  Transportation Needs: No Transportation Needs (01/05/2024)   PRAPARE - Scientist, research (physical sciences) (Medical): No    Lack of Transportation (Non-Medical): No  Physical Activity: Insufficiently Active (01/05/2024)   Exercise Vital Sign    Days of Exercise per Week: 1 day    Minutes of Exercise per Session: 10 min  Stress: No Stress Concern Present (01/05/2024)   [employer] of Occupational Health - Occupational  Stress Questionnaire    Feeling of Stress : Only a little  Social Connections: Moderately Isolated (01/05/2024)   Social Connection and Isolation Panel [NHANES]    Frequency of Communication with Friends and Family: More than three times a week    Frequency of Social Gatherings with Friends and Family: Three times a week    Attends Religious Services: Never    Active Member of Clubs or Organizations: No    Attends Banker Meetings: Never    Marital Status: Married  Catering manager Violence: Not At Risk (05/19/2023)   Humiliation, Afraid, Rape, and Kick questionnaire    Fear of Current or Ex-Partner: No    Emotionally Abused: No    Physically Abused: No    Sexually Abused: No     Constitutional: Patient reports intermittent headaches.  Denies fever, malaise, fatigue, or abrupt weight changes.  HEENT: Pt reports left ear fullness. Denies eye pain, eye redness, ear pain, ringing in the ears, wax buildup, runny nose, nasal congestion, bloody nose, or sore throat. Respiratory: Denies difficulty breathing, shortness of breath, cough or sputum production.   Cardiovascular: Denies chest pain, chest tightness, palpitations or swelling in the hands or feet.  Gastrointestinal: Denies abdominal pain, bloating, constipation, diarrhea or blood in the stool.  GU: Denies urgency, frequency, pain with urination, burning sensation, blood in urine, odor or discharge. Musculoskeletal: Denies decrease in range of motion, difficulty with gait, muscle pain or joint pain and swelling.  Skin: Denies redness, rashes, lesions or ulcercations.  Neurological: Patient reports  neuropathy, difficulty with speech, intermittent dizziness.  Denies difficulty with memory, or problems with balance and coordination.  Psych: Patient has a history of anxiety and depression.  Denies SI/HI.  No other specific complaints in a complete review of systems (except as listed in HPI above).     Objective:   Physical Exam  BP 118/68 (BP Location: Left Arm, Patient Position: Sitting, Cuff Size: Normal)   Ht 5' 3.5" (1.613 m)   Wt 160 lb 3.2 oz (72.7 kg)   BMI 27.93 kg/m    Wt Readings from Last 3 Encounters:  07/04/23 170 lb (77.1 kg)  05/19/23 170 lb (77.1 kg)  12/13/22 193 lb (87.5 kg)    General: Appears her stated age, obese, in NAD. Skin: Warm, dry and intact. No ulcerations noted. HEENT: Head: normal shape and size; Eyes: sclera white, no icterus, conjunctiva pink, PERRLA and EOMs intact; ears: TMs gray and intact, normal light reflex, no effusions. Cardiovascular: Normal rate and rhythm. S1,S2 noted.  No murmur, rubs or gallops noted. No JVD or BLE edema. No carotid bruits noted. Pulmonary/Chest: Normal effort and positive vesicular breath sounds. No respiratory distress. No wheezes, rales or ronchi noted.  Abdomen: Normal bowel sound Musculoskeletal: No difficulty with gait.  Neurological: Alert and oriented. Coordination normal.  Psychiatric: Mood and affect normal. Behavior is normal. Judgment and thought content normal.    BMET    Component Value Date/Time   NA 137 07/04/2023 1325   NA 141 03/16/2018 1537   NA 137 05/06/2012 0949   K 4.6 07/04/2023 1325   K 4.5 05/06/2012 0949   CL 103 07/04/2023 1325   CL 103 05/06/2012 0949   CO2 22 07/04/2023 1325   CO2 27 05/06/2012 0949   GLUCOSE 35 (LL) 07/04/2023 1325   GLUCOSE 123 (H) 05/06/2012 0949   BUN 12 07/04/2023 1325   BUN 13 03/16/2018 1537   BUN 10 05/06/2012 0949   CREATININE 0.80 07/04/2023  1325   CALCIUM 9.1 07/04/2023 1325   CALCIUM 9.2 05/06/2012 0949   GFRNONAA >60 10/31/2022 0341    GFRNONAA >60 05/06/2012 0949   GFRAA 103 03/16/2018 1537   GFRAA >60 05/06/2012 0949    Lipid Panel     Component Value Date/Time   CHOL 125 07/04/2023 1325   CHOL 135 02/28/2017 1604   TRIG 113 07/04/2023 1325   HDL 38 (L) 07/04/2023 1325   HDL 38 (L) 02/28/2017 1604   CHOLHDL 3.3 07/04/2023 1325   VLDL 34.0 12/08/2020 1432   LDLCALC 67 07/04/2023 1325    CBC    Component Value Date/Time   WBC 12.5 (H) 07/04/2023 1325   RBC 4.95 07/04/2023 1325   HGB 14.2 07/04/2023 1325   HGB 15.6 03/16/2018 1537   HCT 43.7 07/04/2023 1325   HCT 47.5 (H) 03/16/2018 1537   PLT 304 07/04/2023 1325   PLT 314 03/16/2018 1537   MCV 88.3 07/04/2023 1325   MCV 88 03/16/2018 1537   MCV 88 05/06/2012 0949   MCH 28.7 07/04/2023 1325   MCHC 32.5 07/04/2023 1325   RDW 13.3 07/04/2023 1325   RDW 14.1 03/16/2018 1537   RDW 13.7 05/06/2012 0949   LYMPHSABS 1.7 10/31/2022 0341   LYMPHSABS 5.1 (H) 02/28/2017 1604   MONOABS 0.7 10/31/2022 0341   EOSABS 0.0 10/31/2022 0341   EOSABS 0.1 02/28/2017 1604   BASOSABS 0.0 10/31/2022 0341   BASOSABS 0.1 02/28/2017 1604    Hgb A1C Lab Results  Component Value Date   HGBA1C 5.8 (H) 07/04/2023           Assessment & Plan:     RTC in 6 months, for your annual exam Nicki Reaper, NP

## 2024-01-06 NOTE — Assessment & Plan Note (Signed)
 A1c and urine microalbumin today Continue glipizide, Lantus, Ozempic and gabapentin Encourage routine eye exam Encouraged routine foot exam Flu shot UTD Pneumovax UTD Encouraged her to get her COVID booster

## 2024-01-06 NOTE — Assessment & Plan Note (Signed)
 Continue gabapentin.

## 2024-01-06 NOTE — Assessment & Plan Note (Signed)
Stable on citalopram, buspirone and clonazepam Support offered

## 2024-01-07 ENCOUNTER — Encounter: Payer: Self-pay | Admitting: Internal Medicine

## 2024-01-07 LAB — COMPLETE METABOLIC PANEL WITH GFR
AG Ratio: 2 (calc) (ref 1.0–2.5)
ALT: 9 U/L (ref 6–29)
AST: 14 U/L (ref 10–35)
Albumin: 4.1 g/dL (ref 3.6–5.1)
Alkaline phosphatase (APISO): 42 U/L (ref 37–153)
BUN: 13 mg/dL (ref 7–25)
CO2: 26 mmol/L (ref 20–32)
Calcium: 9.8 mg/dL (ref 8.6–10.4)
Chloride: 103 mmol/L (ref 98–110)
Creat: 0.61 mg/dL (ref 0.50–1.05)
Globulin: 2.1 g/dL (ref 1.9–3.7)
Glucose, Bld: 69 mg/dL (ref 65–99)
Potassium: 4.9 mmol/L (ref 3.5–5.3)
Sodium: 138 mmol/L (ref 135–146)
Total Bilirubin: 0.4 mg/dL (ref 0.2–1.2)
Total Protein: 6.2 g/dL (ref 6.1–8.1)
eGFR: 100 mL/min/{1.73_m2} (ref >=60)

## 2024-01-07 LAB — CBC
HCT: 43.7 % (ref 35.0–45.0)
Hemoglobin: 14 g/dL (ref 11.7–15.5)
MCH: 28.7 pg (ref 27.0–33.0)
MCHC: 32 g/dL (ref 32.0–36.0)
MCV: 89.5 fL (ref 80.0–100.0)
MPV: 10.9 fL (ref 7.5–12.5)
Platelets: 343 10*3/uL (ref 140–400)
RBC: 4.88 10*6/uL (ref 3.80–5.10)
RDW: 13.8 % (ref 11.0–15.0)
WBC: 14 10*3/uL — ABNORMAL HIGH (ref 3.8–10.8)

## 2024-01-07 LAB — HEMOGLOBIN A1C
Hgb A1c MFr Bld: 6.3 %{Hb} — ABNORMAL HIGH (ref <5.7)
Mean Plasma Glucose: 134 mg/dL
eAG (mmol/L): 7.4 mmol/L

## 2024-01-07 LAB — LIPID PANEL
Cholesterol: 134 mg/dL (ref <200)
HDL: 41 mg/dL — ABNORMAL LOW (ref >=50)
LDL Cholesterol (Calc): 70 mg/dL
Non-HDL Cholesterol (Calc): 93 mg/dL (ref <130)
Total CHOL/HDL Ratio: 3.3 (calc) (ref <5.0)
Triglycerides: 148 mg/dL (ref <150)

## 2024-01-07 LAB — MICROALBUMIN / CREATININE URINE RATIO
Creatinine, Urine: 20 mg/dL (ref 20–275)
Microalb, Ur: <0.2 mg/dL

## 2024-01-15 ENCOUNTER — Ambulatory Visit: Payer: Medicare Other

## 2024-02-09 ENCOUNTER — Other Ambulatory Visit: Payer: Self-pay | Admitting: Internal Medicine

## 2024-02-10 NOTE — Telephone Encounter (Signed)
 Requested Prescriptions  Pending Prescriptions Disp Refills   omeprazole (PRILOSEC) 20 MG capsule [Pharmacy Med Name: OMEPRAZOLE DR CAPS 20MG ] 90 capsule 3    Sig: TAKE 1 CAPSULE DAILY     Gastroenterology: Proton Pump Inhibitors Passed - 02/10/2024 10:25 AM      Passed - Valid encounter within last 12 months    Recent Outpatient Visits           1 month ago Type 2 diabetes mellitus with diabetic polyneuropathy, with long-term current use of insulin [facility])   [facility] [place], [place], [place]

## 2024-02-11 ENCOUNTER — Other Ambulatory Visit: Payer: Self-pay | Admitting: Internal Medicine

## 2024-02-12 NOTE — Telephone Encounter (Signed)
 Requested medication (s) are due for refill today - yes  Requested medication (s) are on the active medication list -yes  Future visit scheduled -yes  Last refill: 01/05/24 #45  Notes to clinic: non delegated Rx  Requested Prescriptions  Pending Prescriptions Disp Refills   clonazePAM (KLONOPIN) 0.5 MG tablet [Pharmacy Med Name: CLONAZEPAM TABS 0.5MG ] 45 tablet 0    Sig: TAKE ONE-HALF (1/2) TO ONE TABLET TWICE A DAY AS NEEDED FOR ANXIETY     Not Delegated - Psychiatry: Anxiolytics/Hypnotics 2 Failed - 02/12/2024  8:41 AM      Failed - This refill cannot be delegated      Failed - Urine Drug Screen completed in last 360 days      Passed - Patient is not pregnant      Passed - Valid encounter within last 6 months    Recent Outpatient Visits           1 month ago Type 2 diabetes mellitus with diabetic polyneuropathy, with long-term current use of insulin (HCC)   [place] [facility] [place], [place], NP                 Requested Prescriptions  Pending Prescriptions Disp Refills   clonazePAM (KLONOPIN) 0.5 MG tablet [Pharmacy Med Name: CLONAZEPAM TABS 0.5MG ] 45 tablet 0    Sig: TAKE ONE-HALF (1/2) TO ONE TABLET TWICE A DAY AS NEEDED FOR ANXIETY     Not Delegated - Psychiatry: Anxiolytics/Hypnotics 2 Failed - 02/12/2024  8:41 AM      Failed - This refill cannot be delegated      Failed - Urine Drug Screen completed in last 360 days      Passed - Patient is not pregnant      Passed - Valid encounter within last 6 months    Recent Outpatient Visits           1 month ago Type 2 diabetes mellitus with diabetic polyneuropathy, with long-term current use of insulin [facility])   [place] [place], [place], [place]

## 2024-02-13 ENCOUNTER — Telehealth: Admitting: Physician Assistant

## 2024-02-13 DIAGNOSIS — B9689 Other specified bacterial agents as the cause of diseases classified elsewhere: Secondary | ICD-10-CM | POA: Diagnosis not present

## 2024-02-13 DIAGNOSIS — J019 Acute sinusitis, unspecified: Secondary | ICD-10-CM

## 2024-02-13 DIAGNOSIS — R051 Acute cough: Secondary | ICD-10-CM

## 2024-02-13 MED ORDER — BENZONATATE 100 MG PO CAPS: 100.0000 mg | ORAL_CAPSULE | Freq: Three times a day (TID) | ORAL | 0 refills | Status: DC | PRN

## 2024-02-13 MED ORDER — AMOXICILLIN-POT CLAVULANATE 875-125 MG PO TABS: 1.0000 | ORAL_TABLET | Freq: Two times a day (BID) | ORAL | 0 refills | Status: DC

## 2024-02-13 NOTE — Progress Notes (Signed)

## 2024-03-11 ENCOUNTER — Other Ambulatory Visit: Payer: Self-pay

## 2024-03-11 ENCOUNTER — Encounter: Payer: Self-pay | Admitting: Internal Medicine

## 2024-03-11 MED ORDER — BLOOD GLUCOSE TEST VI STRP: 1.0000 | ORAL_STRIP | Freq: Three times a day (TID) | 0 refills | Status: AC

## 2024-03-11 MED ORDER — LANCETS MISC. MISC
1.0000 | Freq: Three times a day (TID) | 0 refills | Status: AC
Start: 2024-03-11 — End: 2024-04-10

## 2024-03-11 MED ORDER — BLOOD GLUCOSE MONITORING SUPPL DEVI: 1.0000 | Freq: Three times a day (TID) | 0 refills | Status: AC

## 2024-03-11 MED ORDER — LANCET DEVICE MISC: 1.0000 | Freq: Three times a day (TID) | 0 refills | Status: AC

## 2024-05-05 ENCOUNTER — Other Ambulatory Visit: Payer: Self-pay | Admitting: Internal Medicine

## 2024-05-05 DIAGNOSIS — F32A Depression, unspecified: Secondary | ICD-10-CM

## 2024-05-05 DIAGNOSIS — R202 Paresthesia of skin: Secondary | ICD-10-CM

## 2024-05-06 NOTE — Telephone Encounter (Signed)
 Requested medication (s) are due for refill today: Yes  Requested medication (s) are on the active medication list: Yes  Last refill:  02/12/24  Future visit scheduled: Yes  Notes to clinic:  Unable to refill per protocol, cannot delegate.      Requested Prescriptions  Pending Prescriptions Disp Refills   clonazePAM  (KLONOPIN ) 0.5 MG tablet [Pharmacy Med Name: CLONAZEPAM  TABS 0.5MG ] 45 tablet 0    Sig: TAKE ONE-HALF (1/2) TO ONE TABLET TWICE A DAY AS NEEDED FOR ANXIETY     Not Delegated - Psychiatry: Anxiolytics/Hypnotics 2 Failed - 05/06/2024  8:36 PM      Failed - This refill cannot be delegated      Failed - Urine Drug Screen completed in last 360 days      Passed - Patient is not pregnant      Passed - Valid encounter within last 6 months    Recent Outpatient Visits           4 months ago Type 2 diabetes mellitus with diabetic polyneuropathy, with long-term current use of insulin  (HCC)   [place] [facility] [place], [place], NP              Signed Prescriptions Disp Refills   lisinopril  (ZESTRIL ) 5 MG tablet 90 tablet 0    Sig: TAKE 1 TABLET DAILY     Cardiovascular:  ACE Inhibitors Passed - 05/06/2024  8:36 PM      Passed - Cr in normal range and within 180 days    Creat  Date Value Ref Range Status  01/06/2024 0.61 0.50 - 1.05 mg/dL Final   Creatinine, Urine  Date Value Ref Range Status  01/06/2024 20 20 - 275 mg/dL Final         Passed - K in normal range and within 180 days    Potassium  Date Value Ref Range Status  01/06/2024 4.9 3.5 - 5.3 mmol/L Final  05/06/2012 4.5 3.5 - 5.1 mmol/L Final         Passed - Patient is not pregnant      Passed - Last BP in normal range    BP Readings from Last 1 Encounters:  01/06/24 118/68         Passed - Valid encounter within last 6 months    Recent Outpatient Visits           4 months ago Type 2 diabetes mellitus with diabetic polyneuropathy, with long-term current use of insulin   [facility])   [place] [facility] [place], [place], NP               citalopram  (CELEXA ) 20 MG tablet 90 tablet 0    Sig: TAKE 1 TABLET DAILY     Psychiatry:  Antidepressants - SSRI Passed - 05/06/2024  8:36 PM      Passed - Completed PHQ-2 or PHQ-9 in the last 360 days      Passed - Valid encounter within last 6 months    Recent Outpatient Visits           4 months ago Type 2 diabetes mellitus with diabetic polyneuropathy, with long-term current use of insulin  [facility])    [facility] [place], [place] W, NP               gabapentin  (NEURONTIN ) 300 MG capsule 360 capsule 0    Sig: TAKE 1 CAPSULE FOUR TIMES A DAY     Neurology:  Anticonvulsants - gabapentin  Passed - 05/06/2024  8:36 PM      Passed - Cr in normal range and within 360 days    Creat  Date Value Ref Range Status  01/06/2024 0.61 0.50 - 1.05 mg/dL Final   Creatinine, Urine  Date Value Ref Range Status  01/06/2024 20 20 - 275 mg/dL Final         Passed - Completed PHQ-2 or PHQ-9 in the last 360 days      Passed - Valid encounter within last 12 months    Recent Outpatient Visits           4 months ago Type 2 diabetes mellitus with diabetic polyneuropathy, with long-term current use of insulin  [facility])   [facility] [place], [place] W, NP               simvastatin  (ZOCOR ) 20 MG tablet 90 tablet 2    Sig: TAKE 1 TABLET DAILY AT 6 P.M.     Cardiovascular:  Antilipid - Statins Failed - 05/06/2024  8:36 PM      Failed - Lipid Panel in normal range within the last 12 months    Cholesterol, Total  Date Value Ref Range Status  02/28/2017 135 100 - 199 mg/dL Final   Cholesterol  Date Value Ref Range Status  01/06/2024 134 <200 mg/dL Final   LDL Cholesterol (Calc)  Date Value Ref Range Status  01/06/2024 70 mg/dL (calc) Final    Comment:    Reference range: <100 . Desirable range <100 mg/dL for primary prevention;   <70 mg/dL for patients  with CHD or diabetic patients  with > or = 2 CHD risk factors. SABRA LDL-C is now calculated using the Martin-Hopkins  calculation, which is a validated novel method providing  better accuracy than the Friedewald equation in the  estimation of LDL-C.  Gladis APPLETHWAITE et al. SANDREA. 7986;689(80): 2061-2068  (http://education.[employer].com/faq/FAQ164)    HDL  Date Value Ref Range Status  01/06/2024 41 (L) > OR = 50 mg/dL Final  95/72/7981 38 (L) >39 mg/dL Final   Triglycerides  Date Value Ref Range Status  01/06/2024 148 <150 mg/dL Final         Passed - Patient is not pregnant      Passed - Valid encounter within last 12 months    Recent Outpatient Visits           4 months ago Type 2 diabetes mellitus with diabetic polyneuropathy, with long-term current use of insulin  [facility])   [facility] [place], [place] W, NP               busPIRone  (BUSPAR ) 5 MG tablet 180 tablet 0    Sig: TAKE 1 TABLET TWICE A DAY     Psychiatry: Anxiolytics/Hypnotics - Non-controlled Passed - 05/06/2024  8:36 PM      Passed - Valid encounter within last 12 months    Recent Outpatient Visits           4 months ago Type 2 diabetes mellitus with diabetic polyneuropathy, with long-term current use of insulin  [facility])   [place] [facility] [place], [place], [place]

## 2024-05-19 ENCOUNTER — Other Ambulatory Visit (INDEPENDENT_AMBULATORY_CARE_PROVIDER_SITE_OTHER): Payer: Self-pay | Admitting: Pharmacist

## 2024-05-19 ENCOUNTER — Encounter: Payer: Self-pay | Admitting: Pharmacist

## 2024-05-19 DIAGNOSIS — E1142 Type 2 diabetes mellitus with diabetic polyneuropathy: Secondary | ICD-10-CM

## 2024-05-19 DIAGNOSIS — Z794 Long term (current) use of insulin: Secondary | ICD-10-CM

## 2024-05-19 DIAGNOSIS — Z7985 Long-term (current) use of injectable non-insulin antidiabetic drugs: Secondary | ICD-10-CM

## 2024-05-19 NOTE — Patient Instructions (Signed)
 As of 7/25, Ozempic  will no longer be eligible for automatic refill from [employer] patient assistance program. To request refills after this date, program must receive a refill request form from office up to 30 days prior to refill being due.  Please monitor your supply of Ozempic  at home and when you have only a 1 month supply remaining, contact [facility] at [phone] to request she start refill process for you.  Thank you!  Sharyle Sia, PharmD, [facility] Clinical Pharmacist [facility] [phone]

## 2024-05-19 NOTE — Progress Notes (Signed)
   05/19/2024  Patient ID: Elveria Piety, female   DOB: 1961-01-19, 63 y.o.   MRN: [id number]  Patient enrolled in Ozempic patient assistance program from [employer].  Note that as of 7/25, Ozempic will no longer be eligible for automatic refill from [employer]. To request refills after this date, program must receive a refill request form from office up to 30 days prior to refill being due.  Outreach to patient today and provide this update. Advise patient to monitor her supply of Ozempic at home and when has only a 1 month supply remaining, to contact [facility]: Phone: [phone] to request CPhT start refill form with PCP to be sent to program.  Sharyle Sia, PharmD, [facility] Clinical Pharmacist [facility] [phone]

## 2024-05-20 ENCOUNTER — Encounter: Payer: Self-pay | Admitting: Internal Medicine

## 2024-05-20 DIAGNOSIS — E1142 Type 2 diabetes mellitus with diabetic polyneuropathy: Secondary | ICD-10-CM

## 2024-05-20 MED ORDER — GLIPIZIDE 5 MG PO TABS: ORAL_TABLET | ORAL | 1 refills | Status: AC

## 2024-05-21 ENCOUNTER — Ambulatory Visit: Payer: Medicare Other

## 2024-06-01 ENCOUNTER — Telehealth: Payer: Self-pay

## 2024-06-01 NOTE — Telephone Encounter (Signed)
 Reorder form for Ozempic completed and faxed to provider's office for signature. When completed can fax form to [employer] for processing.

## 2024-06-03 ENCOUNTER — Telehealth: Payer: Self-pay | Admitting: Internal Medicine

## 2024-06-03 NOTE — Telephone Encounter (Signed)
 LVM 06/03/2024 to r/s AWV appt on 06/25/2024 to 08/06/2024.  Please call back to confirm date change   Brittany Pruitt; Care Guide Ambulatory Clinical Support [facility] Direct Dial: [phone]

## 2024-06-25 ENCOUNTER — Ambulatory Visit

## 2024-07-08 ENCOUNTER — Encounter: Payer: Self-pay | Admitting: Internal Medicine

## 2024-07-08 ENCOUNTER — Ambulatory Visit (INDEPENDENT_AMBULATORY_CARE_PROVIDER_SITE_OTHER): Admitting: Internal Medicine

## 2024-07-08 VITALS — BP 110/68 | Ht 63.5 in | Wt 155.4 lb

## 2024-07-08 DIAGNOSIS — Z0001 Encounter for general adult medical examination with abnormal findings: Secondary | ICD-10-CM | POA: Diagnosis not present

## 2024-07-08 DIAGNOSIS — Z1231 Encounter for screening mammogram for malignant neoplasm of breast: Secondary | ICD-10-CM | POA: Diagnosis not present

## 2024-07-08 DIAGNOSIS — Z23 Encounter for immunization: Secondary | ICD-10-CM

## 2024-07-08 DIAGNOSIS — Z6827 Body mass index (BMI) 27.0-27.9, adult: Secondary | ICD-10-CM

## 2024-07-08 DIAGNOSIS — Z794 Long term (current) use of insulin: Secondary | ICD-10-CM | POA: Diagnosis not present

## 2024-07-08 DIAGNOSIS — E663 Overweight: Secondary | ICD-10-CM

## 2024-07-08 DIAGNOSIS — E1142 Type 2 diabetes mellitus with diabetic polyneuropathy: Secondary | ICD-10-CM

## 2024-07-08 NOTE — Patient Instructions (Signed)
 Health Maintenance for Postmenopausal Women Menopause is a normal process in which your ability to get pregnant comes to an end. This process happens slowly over many months or years, usually between the ages of 76 and 38. Menopause is complete when you have missed your menstrual period for 12 months. It is important to talk with your health care provider about some of the most common conditions that affect women after menopause (postmenopausal women). These include heart disease, cancer, and bone loss (osteoporosis). Adopting a healthy lifestyle and getting preventive care can help to promote your health and wellness. The actions you take can also lower your chances of developing some of these common conditions. What are the signs and symptoms of menopause? During menopause, you may have the following symptoms: Hot flashes. These can be moderate or severe. Night sweats. Decrease in sex drive. Mood swings. Headaches. Tiredness (fatigue). Irritability. Memory problems. Problems falling asleep or staying asleep. Talk with your health care provider about treatment options for your symptoms. Do I need hormone replacement therapy? Hormone replacement therapy is effective in treating symptoms that are caused by menopause, such as hot flashes and night sweats. Hormone replacement carries certain risks, especially as you become older. If you are thinking about using estrogen or estrogen with progestin, discuss the benefits and risks with your health care provider. How can I reduce my risk for heart disease and stroke? The risk of heart disease, heart attack, and stroke increases as you age. One of the causes may be a change in the body's hormones during menopause. This can affect how your body uses dietary fats, triglycerides, and cholesterol. Heart attack and stroke are medical emergencies. There are many things that you can do to help prevent heart disease and stroke. Watch your blood pressure High  blood pressure causes heart disease and increases the risk of stroke. This is more likely to develop in people who have high blood pressure readings or are overweight. Have your blood pressure checked: Every 3-5 years if you are 32-23 years of age. Every year if you are 31 years old or older. Eat a healthy diet  Eat a diet that includes plenty of vegetables, fruits, low-fat dairy products, and lean protein. Do not eat a lot of foods that are high in solid fats, added sugars, or sodium. Get regular exercise Get regular exercise. This is one of the most important things you can do for your health. Most adults should: Try to exercise for at least 150 minutes each week. The exercise should increase your heart rate and make you sweat (moderate-intensity exercise). Try to do strengthening exercises at least twice each week. Do these in addition to the moderate-intensity exercise. Spend less time sitting. Even light physical activity can be beneficial. Other tips Work with your health care provider to achieve or maintain a healthy weight. Do not use any products that contain nicotine or tobacco. These products include cigarettes, chewing tobacco, and vaping devices, such as e-cigarettes. If you need help quitting, ask your health care provider. Know your numbers. Ask your health care provider to check your cholesterol and your blood sugar (glucose). Continue to have your blood tested as directed by your health care provider. Do I need screening for cancer? Depending on your health history and family history, you may need to have cancer screenings at different stages of your life. This may include screening for: Breast cancer. Cervical cancer. Lung cancer. Colorectal cancer. What is my risk for osteoporosis? After menopause, you may be  at increased risk for osteoporosis. Osteoporosis is a condition in which bone destruction happens more quickly than new bone creation. To help prevent osteoporosis or  the bone fractures that can happen because of osteoporosis, you may take the following actions: If you are 24-54 years old, get at least 1,000 mg of calcium and at least 600 international units (IU) of vitamin D  per day. If you are older than age 75 but younger than age 30, get at least 1,200 mg of calcium and at least 600 international units (IU) of vitamin D  per day. If you are older than age 8, get at least 1,200 mg of calcium and at least 800 international units (IU) of vitamin D  per day. Smoking and drinking excessive alcohol increase the risk of osteoporosis. Eat foods that are rich in calcium and vitamin D , and do weight-bearing exercises several times each week as directed by your health care provider. How does menopause affect my mental health? Depression may occur at any age, but it is more common as you become older. Common symptoms of depression include: Feeling depressed. Changes in sleep patterns. Changes in appetite or eating patterns. Feeling an overall lack of motivation or enjoyment of activities that you previously enjoyed. Frequent crying spells. Talk with your health care provider if you think that you are experiencing any of these symptoms. General instructions See your health care provider for regular wellness exams and vaccines. This may include: Scheduling regular health, dental, and eye exams. Getting and maintaining your vaccines. These include: Influenza vaccine. Get this vaccine each year before the flu season begins. Pneumonia vaccine. Shingles vaccine. Tetanus, diphtheria, and pertussis (Tdap) booster vaccine. Your health care provider may also recommend other immunizations. Tell your health care provider if you have ever been abused or do not feel safe at home. Summary Menopause is a normal process in which your ability to get pregnant comes to an end. This condition causes hot flashes, night sweats, decreased interest in sex, mood swings, headaches, or lack  of sleep. Treatment for this condition may include hormone replacement therapy. Take actions to keep yourself healthy, including exercising regularly, eating a healthy diet, watching your weight, and checking your blood pressure and blood sugar levels. Get screened for cancer and depression. Make sure that you are up to date with all your vaccines. This information is not intended to replace advice given to you by your health care provider. Make sure you discuss any questions you have with your health care provider. Document Revised: 03/12/2021 Document Reviewed: 03/12/2021 Elsevier Patient Education  2024 [employer].

## 2024-07-08 NOTE — Assessment & Plan Note (Signed)
 Encouraged diet and exercise for weight loss ?

## 2024-07-08 NOTE — Progress Notes (Signed)
 Subjective:    Patient ID: Brittany Pruitt, female    DOB: [birth year], 63 y.o.   MRN: [id number]  HPI  Patient presents to clinic today for her annual exam.  Flu: 06/2023 Tetanus: 06/2017 COVID: X 2 Pneumovax: 06/2019 Prevnar: 05/2015 Shingrix: Never Pap smear: Hysterectomy Mammogram: 09/2022 Bone density: 09/2021 Lung cancer screening: 01/2023 Colon screening: 02/2016 Vision screening: annually Dentist: as needed  Diet: She does eat some meat. She consumes fruits and veggies. She tries to avoid fried foods. She drinks mostly water, coffee. Exercise: mow the grass, work in the yard   Review of Systems     Past Medical History:  Diagnosis Date   Anxiety    Chicken pox    Depression    Diabetes mellitus without complication (HCC)    Frequent headaches    Hyperlipidemia     Current Outpatient Medications  Medication Sig Dispense Refill   acetaminophen  (TYLENOL ) 500 MG tablet Take 500 mg by mouth every 6 (six) hours as needed for moderate pain.     amoxicillin -clavulanate (AUGMENTIN ) 875-125 MG tablet Take 1 tablet by mouth 2 (two) times daily. 20 tablet 0   aspirin  EC 81 MG tablet Take 1 tablet (81 mg total) by mouth daily. Swallow whole.     benzonatate  (TESSALON ) 100 MG capsule Take 1-2 capsules (100-200 mg total) by mouth 3 (three) times daily as needed. 30 capsule 0   Blood Glucose Monitoring Suppl DEVI 1 each by Does not apply route in the morning, at noon, and at bedtime. May substitute to any manufacturer covered by patient's insurance. 1 each 0   busPIRone  (BUSPAR ) 5 MG tablet TAKE 1 TABLET TWICE A DAY 180 tablet 0   Cholecalciferol (VITAMIN D3) 50 MCG (2000 UT) capsule Take 2,000 Units by mouth daily.     citalopram  (CELEXA ) 20 MG tablet TAKE 1 TABLET DAILY 90 tablet 0   clonazePAM  (KLONOPIN ) 0.5 MG tablet TAKE ONE-HALF (1/2) TO ONE TABLET TWICE A DAY AS NEEDED FOR ANXIETY 45 tablet 0   cyanocobalamin (VITAMIN B12) 1000 MCG tablet Take 1,000 mcg by mouth daily.      gabapentin  (NEURONTIN ) 300 MG capsule TAKE 1 CAPSULE FOUR TIMES A DAY 360 capsule 0   glipiZIDE  (GLUCOTROL ) 5 MG tablet TAKE 1 TABLET TWICE A DAY BEFORE MEALS 180 tablet 1   LANTUS  SOLOSTAR 100 UNIT/ML Solostar Pen INJECT 35 UNITS UNDER THE SKIN TWICE A DAY 60 mL 3   lisinopril  (ZESTRIL ) 5 MG tablet TAKE 1 TABLET DAILY 90 tablet 0   meclizine  (ANTIVERT ) 25 MG tablet TAKE 1 TABLET THREE TIMES A DAY AS NEEDED FOR DIZZINESS 30 tablet 35   Multiple Vitamins-Minerals (CENTRUM SILVER 50+WOMEN PO) Take 1 tablet by mouth daily.     Omega-3 Fatty Acids (FISH OIL) 1000 MG CAPS Take 1 capsule by mouth in the morning and at bedtime.     omeprazole  (PRILOSEC) 20 MG capsule TAKE 1 CAPSULE DAILY 90 capsule 3   Semaglutide, 1 MG/DOSE, (OZEMPIC, 1 MG/DOSE,) 4 MG/3ML SOPN Inject 1 mg into the skin once a week.     simvastatin  (ZOCOR ) 20 MG tablet TAKE 1 TABLET DAILY AT 6 P.M. 90 tablet 2   No current facility-administered medications for this visit.    Allergies  Allergen Reactions   Sulfa Antibiotics Hives    Family History  Problem Relation Age of Onset   Uterine cancer Mother    Breast cancer Mother 57   Pancreatic cancer Mother    Heart disease Father  Hypertension Father    Anxiety disorder Father    Diabetes Father    Uterine cancer Maternal Aunt    Breast cancer Maternal Aunt 60   Heart disease Paternal Grandfather     Social History   Socioeconomic History   Marital status: Married    Spouse name: Carlissa Pesola   Number of children: Not on file   Years of education: Not on file   Highest education level: 12th grade  Occupational History   Not on file  Tobacco Use   Smoking status: Some Days    Current packs/day: 0.00    Average packs/day: 1 pack/day for 30.0 years (30.0 ttl pk-yrs)    Types: Cigarettes    Last attempt to quit: 01/14/2023    Years since quitting: 1.4   Smokeless tobacco: Never  Vaping Use   Vaping status: Never Used  Substance and Sexual Activity    Alcohol use: No    Alcohol/week: 0.0 standard drinks of alcohol   Drug use: Never   Sexual activity: Yes    Partners: Male  Other Topics Concern   Not on file  Social History Narrative   Pt lives with husband Ozell.  Is not currently working.    Social Drivers of Corporate investment banker Strain: Low Risk  (07/07/2024)   Overall Financial Resource Strain (CARDIA)    Difficulty of Paying Living Expenses: Not very hard  Food Insecurity: No Food Insecurity (07/07/2024)   Hunger Vital Sign    Worried About Running Out of Food in the Last Year: Never true    Ran Out of Food in the Last Year: Never true  Transportation Needs: No Transportation Needs (07/07/2024)   PRAPARE - Administrator, Civil Service (Medical): No    Lack of Transportation (Non-Medical): No  Physical Activity: Unknown (07/07/2024)   Exercise Vital Sign    Days of Exercise per Week: Patient declined    Minutes of Exercise per Session: Not on file  Stress: Stress Concern Present (07/07/2024)   [employer] of Occupational Health - Occupational Stress Questionnaire    Feeling of Stress: To some extent  Social Connections: Moderately Isolated (07/07/2024)   Social Connection and Isolation Panel    Frequency of Communication with Friends and Family: More than three times a week    Frequency of Social Gatherings with Friends and Family: Twice a week    Attends Religious Services: Patient declined    Database administrator or Organizations: No    Attends Engineer, structural: Not on file    Marital Status: Married  Catering manager Violence: Not At Risk (05/19/2023)   Humiliation, Afraid, Rape, and Kick questionnaire    Fear of Current or Ex-Partner: No    Emotionally Abused: No    Physically Abused: No    Sexually Abused: No     Constitutional: Patient reports intermittent headaches.  Denies fever, malaise, fatigue, or abrupt weight changes.  HEENT: Denies eye pain, eye redness, ear pain,  ringing in the ears, wax buildup, runny nose, nasal congestion, bloody nose, or sore throat. Respiratory: Denies difficulty breathing, shortness of breath, cough or sputum production.   Cardiovascular: Denies chest pain, chest tightness, palpitations or swelling in the hands or feet.  Gastrointestinal: Denies abdominal pain, bloating, constipation, diarrhea or blood in the stool.  GU: Patient reports dark urine at times.  Denies urgency, frequency, pain with urination, burning sensation, blood in urine, odor or discharge. Musculoskeletal: Denies decrease in range  of motion, difficulty with gait, muscle pain or joint pain and swelling.  Skin: Denies redness, rashes, lesions or ulcercations.  Neurological: Patient reports neuropathic pain, difficulty with speech.  Denies dizziness, difficulty with memory, or problems with balance and coordination.  Psych: Patient has a history of anxiety and depression.  Denies SI/HI.  No other specific complaints in a complete review of systems (except as listed in HPI above).  Objective:   Physical Exam BP 110/68 (BP Location: Left Arm, Patient Position: Sitting, Cuff Size: Normal)   Ht 5' 3.5 (1.613 m)   Wt 155 lb 6.4 oz (70.5 kg)   BMI 27.10 kg/m    Wt Readings from Last 3 Encounters:  01/06/24 160 lb 3.2 oz (72.7 kg)  07/04/23 170 lb (77.1 kg)  05/19/23 170 lb (77.1 kg)    General: Appears her stated age, overweight, in NAD. Skin: Warm, dry and intact.  No ulcerations noted. HEENT: Head: normal shape and size; Eyes: sclera white, no icterus, conjunctiva pink, PERRLA and EOMs intact;  Neck:  Neck supple, trachea midline. No masses, lumps or thyromegaly present.  Cardiovascular: Normal rate and rhythm. S1,S2 noted.  No murmur, rubs or gallops noted. No JVD or BLE edema. No carotid bruits noted. Pulmonary/Chest: Normal effort and positive vesicular breath sounds. No respiratory distress. No wheezes, rales or ronchi noted.  Abdomen: Normal bowel  sounds.  Musculoskeletal: Strength 5/5 BUE/BLE.  No difficulty with gait.  Neurological: Alert and oriented. Cranial nerves II-XII grossly intact.  Dysphonic speech noted.  Coordination normal.  Psychiatric: Mood and affect normal. Behavior is normal. Judgment and thought content normal.     BMET    Component Value Date/Time   NA 138 01/06/2024 1101   NA 141 03/16/2018 1537   NA 137 05/06/2012 0949   K 4.9 01/06/2024 1101   K 4.5 05/06/2012 0949   CL 103 01/06/2024 1101   CL 103 05/06/2012 0949   CO2 26 01/06/2024 1101   CO2 27 05/06/2012 0949   GLUCOSE 69 01/06/2024 1101   GLUCOSE 123 (H) 05/06/2012 0949   BUN 13 01/06/2024 1101   BUN 13 03/16/2018 1537   BUN 10 05/06/2012 0949   CREATININE 0.61 01/06/2024 1101   CALCIUM 9.8 01/06/2024 1101   CALCIUM 9.2 05/06/2012 0949   GFRNONAA >60 10/31/2022 0341   GFRNONAA >60 05/06/2012 0949   GFRAA 103 03/16/2018 1537   GFRAA >60 05/06/2012 0949    Lipid Panel     Component Value Date/Time   CHOL 134 01/06/2024 1101   CHOL 135 02/28/2017 1604   TRIG 148 01/06/2024 1101   HDL 41 (L) 01/06/2024 1101   HDL 38 (L) 02/28/2017 1604   CHOLHDL 3.3 01/06/2024 1101   VLDL 34.0 12/08/2020 1432   LDLCALC 70 01/06/2024 1101    CBC    Component Value Date/Time   WBC 14.0 (H) 01/06/2024 1101   RBC 4.88 01/06/2024 1101   HGB 14.0 01/06/2024 1101   HGB 15.6 03/16/2018 1537   HCT 43.7 01/06/2024 1101   HCT 47.5 (H) 03/16/2018 1537   PLT 343 01/06/2024 1101   PLT 314 03/16/2018 1537   MCV 89.5 01/06/2024 1101   MCV 88 03/16/2018 1537   MCV 88 05/06/2012 0949   MCH 28.7 01/06/2024 1101   MCHC 32.0 01/06/2024 1101   RDW 13.8 01/06/2024 1101   RDW 14.1 03/16/2018 1537   RDW 13.7 05/06/2012 0949   LYMPHSABS 1.7 10/31/2022 0341   LYMPHSABS 5.1 (H) 02/28/2017 1604  MONOABS 0.7 10/31/2022 0341   EOSABS 0.0 10/31/2022 0341   EOSABS 0.1 02/28/2017 1604   BASOSABS 0.0 10/31/2022 0341   BASOSABS 0.1 02/28/2017 1604    Hgb A1C Lab  Results  Component Value Date   HGBA1C 6.3 (H) 01/06/2024            Assessment & Plan:   Preventative health maintenance:  Flu shot today Tetanus UTD Encouraged her to get her COVID booster Pneumovax and Prevnar UTD Prevnar 20 today Discussed Shingrix vaccine, she will check coverage with her insurance company and schedule visit if she would like to have this done She no longer needs Pap smears Mammogram ordered-she will call to schedule Bone density UTD Colon screening UTD She declines lung cancer screening Encouraged her to consume a balanced diet and exercise regimen Advised her to see an eye doctor and dentist annually We will check CBC, c-Met, lipid, A1c today  RTC in 6 months, follow-up chronic conditions Angeline Laura, NP

## 2024-07-09 ENCOUNTER — Ambulatory Visit: Payer: Self-pay | Admitting: Internal Medicine

## 2024-07-09 LAB — COMPREHENSIVE METABOLIC PANEL WITH GFR
AG Ratio: 1.8 (calc) (ref 1.0–2.5)
ALT: 13 U/L (ref 6–29)
AST: 13 U/L (ref 10–35)
Albumin: 4.2 g/dL (ref 3.6–5.1)
Alkaline phosphatase (APISO): 46 U/L (ref 37–153)
BUN: 15 mg/dL (ref 7–25)
CO2: 26 mmol/L (ref 20–32)
Calcium: 9.6 mg/dL (ref 8.6–10.4)
Chloride: 104 mmol/L (ref 98–110)
Creat: 0.7 mg/dL (ref 0.50–1.05)
Globulin: 2.3 g/dL (ref 1.9–3.7)
Glucose, Bld: 97 mg/dL (ref 65–99)
Potassium: 4.8 mmol/L (ref 3.5–5.3)
Sodium: 138 mmol/L (ref 135–146)
Total Bilirubin: 0.4 mg/dL (ref 0.2–1.2)
Total Protein: 6.5 g/dL (ref 6.1–8.1)
eGFR: 97 mL/min/1.73m2 (ref >=60)

## 2024-07-09 LAB — CBC
HCT: 47.2 % — ABNORMAL HIGH (ref 35.0–45.0)
Hemoglobin: 15 g/dL (ref 11.7–15.5)
MCH: 29 pg (ref 27.0–33.0)
MCHC: 31.8 g/dL — ABNORMAL LOW (ref 32.0–36.0)
MCV: 91.3 fL (ref 80.0–100.0)
MPV: 10.9 fL (ref 7.5–12.5)
Platelets: 318 Thousand/uL (ref 140–400)
RBC: 5.17 Million/uL — ABNORMAL HIGH (ref 3.80–5.10)
RDW: 14.4 % (ref 11.0–15.0)
WBC: 12.7 Thousand/uL — ABNORMAL HIGH (ref 3.8–10.8)

## 2024-07-09 LAB — LIPID PANEL
Cholesterol: 177 mg/dL (ref <200)
HDL: 42 mg/dL — ABNORMAL LOW (ref >=50)
LDL Cholesterol (Calc): 110 mg/dL — ABNORMAL HIGH
Non-HDL Cholesterol (Calc): 135 mg/dL — ABNORMAL HIGH (ref <130)
Total CHOL/HDL Ratio: 4.2 (calc) (ref <5.0)
Triglycerides: 141 mg/dL (ref <150)

## 2024-07-09 LAB — HEMOGLOBIN A1C
Hgb A1c MFr Bld: 6 % — ABNORMAL HIGH (ref <5.7)
Mean Plasma Glucose: 126 mg/dL
eAG (mmol/L): 7 mmol/L

## 2024-07-30 ENCOUNTER — Other Ambulatory Visit: Payer: Self-pay | Admitting: Family Medicine

## 2024-07-30 ENCOUNTER — Other Ambulatory Visit: Payer: Self-pay | Admitting: Internal Medicine

## 2024-07-30 DIAGNOSIS — R202 Paresthesia of skin: Secondary | ICD-10-CM

## 2024-07-30 DIAGNOSIS — F32A Depression, unspecified: Secondary | ICD-10-CM

## 2024-07-30 NOTE — Telephone Encounter (Signed)
 Requested medications are due for refill today.  yes  Requested medications are on the active medications list.  yes  Last refill. 05/10/2024 #45 0 rf  Future visit scheduled.   yes  Notes to clinic.  Refill not delegated.    Requested Prescriptions  Pending Prescriptions Disp Refills   clonazePAM  (KLONOPIN ) 0.5 MG tablet [Pharmacy Med Name: CLONAZEPAM  TABS 0.5MG ] 45 tablet 0    Sig: TAKE ONE-HALF (1/2) TO ONE TABLET TWICE A DAY AS NEEDED FOR ANXIETY     Not Delegated - Psychiatry: Anxiolytics/Hypnotics 2 Failed - 07/30/2024  4:56 PM      Failed - This refill cannot be delegated      Failed - Urine Drug Screen completed in last 360 days      Passed - Patient is not pregnant      Passed - Valid encounter within last 6 months    Recent Outpatient Visits           3 weeks ago Encounter for general adult medical examination with abnormal findings   [facility] [place], [place] W, NP   6 months ago Type 2 diabetes mellitus with diabetic polyneuropathy, with long-term current use of insulin  [facility])   [facility] [place], Angeline ORN, [place]

## 2024-07-30 NOTE — Telephone Encounter (Signed)
 Requested Prescriptions  Pending Prescriptions Disp Refills   lisinopril  (ZESTRIL ) 5 MG tablet [Pharmacy Med Name: LISINOPRIL  TABS 5MG ] 90 tablet 1    Sig: TAKE 1 TABLET DAILY     Cardiovascular:  ACE Inhibitors Passed - 07/30/2024  5:14 PM      Passed - Cr in normal range and within 180 days    Creat  Date Value Ref Range Status  07/08/2024 0.70 0.50 - 1.05 mg/dL Final   Creatinine, Urine  Date Value Ref Range Status  01/06/2024 20 20 - 275 mg/dL Final         Passed - K in normal range and within 180 days    Potassium  Date Value Ref Range Status  07/08/2024 4.8 3.5 - 5.3 mmol/L Final  05/06/2012 4.5 3.5 - 5.1 mmol/L Final         Passed - Patient is not pregnant      Passed - Last BP in normal range    BP Readings from Last 1 Encounters:  07/08/24 110/68         Passed - Valid encounter within last 6 months    Recent Outpatient Visits           3 weeks ago Encounter for general adult medical examination with abnormal findings   [facility] [place], Angeline ORN, NP   6 months ago Type 2 diabetes mellitus with diabetic polyneuropathy, with long-term current use of insulin  [facility])   [place] [facility] [place], Angeline ORN, NP               citalopram  (CELEXA ) 20 MG tablet [Pharmacy Med Name: CITALOPRAM  HYDROBROMIDE TABS 20MG ] 90 tablet 1    Sig: TAKE 1 TABLET DAILY     Psychiatry:  Antidepressants - SSRI Passed - 07/30/2024  5:14 PM      Passed - Completed PHQ-2 or PHQ-9 in the last 360 days      Passed - Valid encounter within last 6 months    Recent Outpatient Visits           3 weeks ago Encounter for general adult medical examination with abnormal findings   [place] [facility] [place], [place], NP   6 months ago Type 2 diabetes mellitus with diabetic polyneuropathy, with long-term current use of insulin  [facility])   [place] [facility] [place], [place] W, NP                gabapentin  (NEURONTIN ) 300 MG capsule [Pharmacy Med Name: GABAPENTIN  CAPS 300MG ] 360 capsule 1    Sig: TAKE 1 CAPSULE FOUR TIMES A DAY     Neurology: Anticonvulsants - gabapentin  Passed - 07/30/2024  5:14 PM      Passed - Cr in normal range and within 360 days    Creat  Date Value Ref Range Status  07/08/2024 0.70 0.50 - 1.05 mg/dL Final   Creatinine, Urine  Date Value Ref Range Status  01/06/2024 20 20 - 275 mg/dL Final         Passed - Completed PHQ-2 or PHQ-9 in the last 360 days      Passed - Valid encounter within last 12 months    Recent Outpatient Visits           3 weeks ago Encounter for general adult medical examination with abnormal findings    [facility] [place], Angeline ORN, NP   6 months ago Type  2 diabetes mellitus with diabetic polyneuropathy, with long-term current use of insulin  [facility] [place] [facility])   [facility] [place], Angeline ORN, NP               busPIRone  (BUSPAR ) 5 MG tablet [Pharmacy Med Name: BUSPIRONE  HCL TABS 5MG ] 180 tablet 1    Sig: TAKE 1 TABLET TWICE A DAY     Psychiatry: Anxiolytics/Hypnotics - Non-controlled Passed - 07/30/2024  5:14 PM      Passed - Valid encounter within last 12 months    Recent Outpatient Visits           3 weeks ago Encounter for general adult medical examination with abnormal findings   [place] [facility] [place], [place], NP   6 months ago Type 2 diabetes mellitus with diabetic polyneuropathy, with long-term current use of insulin  [facility])   [place] [facility] [place], Angeline ORN, [place]

## 2024-08-03 ENCOUNTER — Other Ambulatory Visit: Payer: Self-pay | Admitting: Pharmacist

## 2024-08-03 ENCOUNTER — Encounter: Payer: Self-pay | Admitting: Pharmacist

## 2024-08-03 DIAGNOSIS — E119 Type 2 diabetes mellitus without complications: Secondary | ICD-10-CM

## 2024-08-03 DIAGNOSIS — E1142 Type 2 diabetes mellitus with diabetic polyneuropathy: Secondary | ICD-10-CM

## 2024-08-03 NOTE — Progress Notes (Signed)
   08/03/2024  Patient ID: Elveria Piety, female   DOB: [birth year], 63 y.o.   MRN: [id number]   Patient enrolled in Ozempic patient assistance program from [employer] through 11/03/2024  [employer] has announced that they will no longer offer Ozempic to [employer] beneficiaries through their Patient Assistance Program in 2026.   Outreach to patient today and provide this update.   Based on reported income, patient does not meet criteria for Extra Help subsidy  Counsel patient that Medicare's Annual Enrollment Period for 2026 starts 08/18/2024. Encourage patient to review deductibles formulary coverage and copay amounts (including for Ozempic) between plans. Also share with patient the contact information for the [facility] Information Program ([place] [employer]) that can help with reviewing these Medicare plans Will also share this information with patient via MyChart message  Also patient to monitor her supply of Ozempic at home for remainder of calendar year and when has only a 1 month supply remaining, to contact [place]: Phone: [phone] to request CPhT start refill form with PCP to be sent to program.  Patient requests that next month we discuss further and review whether [employer] patient assistance program will have patient assistance for/patient eligible for Trulicity.  Follow Up Plan: Clinical Pharmacist will follow up with patient by telephone on 08/23/2024 at 9:00 AM   Sharyle Sia, PharmD, [facility] Clinical Pharmacist [facility] [phone]

## 2024-08-03 NOTE — Patient Instructions (Signed)
 [employer], the maker of Ozempic  and Rybelsus , has announced that they will no longer offer these medications to Medicare beneficiaries through their Patient Assistance Program in 2026.    This means that if you currently receive Ozempic  or Rybelsus  at no cost through the program, starting in January 2026 you'll need to use your insurance to continue on these medicines.    Choosing a Medicare Plan   With Medicare's Annual Enrollment Period starting on October 15th, we encourage you to review formulary coverage and copay amounts for Ozempic  or Rybelsus , as costs can vary widely between plans. Starting on Wednesday, October 1st you can compare your Medicare plan options by going to the Plan Finder tool at [employer] ( TeleconferenceOnDemand.fr ).    There are Medicare Specialists through the [place] [facility] Insurance Information Program ([place]) that can help you shop for Medicare plans.    [place] [facility] toll free number: [phone] (Monday - Friday 8 am - 5 pm)   There are representatives located in each county:    [facility] [address]     [place]  [id number] [phone] Call for an appointment with [facility]        Maximum Out-of-Pocket and Prescription Payment Plan   In 2026, the maximum yearly out-of-pocket cost for medications is $2,100 for all Medicare beneficiaries. This means you will not have to pay more than $2,100 in total for all prescription medications filled on your Medicare plan in 2026. You may also choose to enroll in a Medicare Prescription Payment Plan through your chosen 2026 Medicare plan. This lets you spread out your prescription drug costs over the year instead of laying a large amount all at once at the pharmacy.      Sharyle Sia, PharmD, [facility] Clinical Pharmacist [facility] [phone]

## 2024-08-06 ENCOUNTER — Ambulatory Visit

## 2024-08-23 ENCOUNTER — Other Ambulatory Visit (HOSPITAL_COMMUNITY): Payer: Self-pay

## 2024-08-23 ENCOUNTER — Other Ambulatory Visit

## 2024-08-24 ENCOUNTER — Other Ambulatory Visit: Payer: Self-pay

## 2024-08-24 ENCOUNTER — Observation Stay
Admission: EM | Admit: 2024-08-24 | Discharge: 2024-08-25 | Disposition: A | Attending: Emergency Medicine | Admitting: Emergency Medicine

## 2024-08-24 ENCOUNTER — Encounter: Payer: Self-pay | Admitting: Emergency Medicine

## 2024-08-24 ENCOUNTER — Encounter
Admission: EM | Disposition: A | Discharge status: Home / Self Care | Payer: Self-pay | Source: Home / Self Care | Attending: Emergency Medicine

## 2024-08-24 ENCOUNTER — Emergency Department

## 2024-08-24 ENCOUNTER — Observation Stay: Admit: 2024-08-24 | Discharge: 2024-08-24 | Disposition: A | Attending: Internal Medicine

## 2024-08-24 DIAGNOSIS — I214 Non-ST elevation (NSTEMI) myocardial infarction: Secondary | ICD-10-CM

## 2024-08-24 DIAGNOSIS — I25110 Atherosclerotic heart disease of native coronary artery with unstable angina pectoris: Secondary | ICD-10-CM | POA: Diagnosis not present

## 2024-08-24 DIAGNOSIS — Z794 Long term (current) use of insulin: Secondary | ICD-10-CM | POA: Diagnosis not present

## 2024-08-24 DIAGNOSIS — R079 Chest pain, unspecified: Secondary | ICD-10-CM | POA: Diagnosis not present

## 2024-08-24 DIAGNOSIS — I2510 Atherosclerotic heart disease of native coronary artery without angina pectoris: Secondary | ICD-10-CM | POA: Diagnosis not present

## 2024-08-24 DIAGNOSIS — F17210 Nicotine dependence, cigarettes, uncomplicated: Secondary | ICD-10-CM | POA: Diagnosis not present

## 2024-08-24 DIAGNOSIS — R202 Paresthesia of skin: Secondary | ICD-10-CM

## 2024-08-24 DIAGNOSIS — E782 Mixed hyperlipidemia: Secondary | ICD-10-CM | POA: Diagnosis not present

## 2024-08-24 DIAGNOSIS — Z79899 Other long term (current) drug therapy: Secondary | ICD-10-CM | POA: Insufficient documentation

## 2024-08-24 DIAGNOSIS — R110 Nausea: Secondary | ICD-10-CM | POA: Diagnosis not present

## 2024-08-24 DIAGNOSIS — R0602 Shortness of breath: Secondary | ICD-10-CM | POA: Diagnosis not present

## 2024-08-24 DIAGNOSIS — I10 Essential (primary) hypertension: Secondary | ICD-10-CM | POA: Insufficient documentation

## 2024-08-24 DIAGNOSIS — E785 Hyperlipidemia, unspecified: Secondary | ICD-10-CM | POA: Insufficient documentation

## 2024-08-24 DIAGNOSIS — R7989 Other specified abnormal findings of blood chemistry: Secondary | ICD-10-CM | POA: Diagnosis not present

## 2024-08-24 DIAGNOSIS — I200 Unstable angina: Secondary | ICD-10-CM | POA: Diagnosis not present

## 2024-08-24 DIAGNOSIS — F17200 Nicotine dependence, unspecified, uncomplicated: Secondary | ICD-10-CM

## 2024-08-24 DIAGNOSIS — I249 Acute ischemic heart disease, unspecified: Secondary | ICD-10-CM | POA: Diagnosis present

## 2024-08-24 DIAGNOSIS — E119 Type 2 diabetes mellitus without complications: Secondary | ICD-10-CM | POA: Diagnosis not present

## 2024-08-24 DIAGNOSIS — Z7982 Long term (current) use of aspirin: Secondary | ICD-10-CM | POA: Diagnosis not present

## 2024-08-24 DIAGNOSIS — R0789 Other chest pain: Secondary | ICD-10-CM | POA: Diagnosis not present

## 2024-08-24 DIAGNOSIS — R531 Weakness: Secondary | ICD-10-CM | POA: Diagnosis not present

## 2024-08-24 HISTORY — PX: LEFT HEART CATH AND CORONARY ANGIOGRAPHY: CATH118249

## 2024-08-24 LAB — ECHOCARDIOGRAM COMPLETE
AR max vel: 1.86 cm2
AV Area VTI: 1.73 cm2
AV Area mean vel: 1.8 cm2
AV Mean grad: 2 mmHg
AV Peak grad: 3.2 mmHg
Ao pk vel: 0.9 m/s
Area-P 1/2: 3.48 cm2
Calc EF: 72.1 %
Height: 63 in
MV VTI: 1.44 cm2
S' Lateral: 2.1 cm
Single Plane A2C EF: 68.9 %
Single Plane A4C EF: 75.6 %
Weight: 2480 [oz_av]

## 2024-08-24 LAB — BASIC METABOLIC PANEL WITH GFR
Anion gap: 13 (ref 5–15)
BUN: 22 mg/dL (ref 8–23)
CO2: 22 mmol/L (ref 22–32)
Calcium: 9.5 mg/dL (ref 8.9–10.3)
Chloride: 103 mmol/L (ref 98–111)
Creatinine, Ser: 0.86 mg/dL (ref 0.44–1.00)
GFR, Estimated: >60 mL/min (ref >60)
Glucose, Bld: 177 mg/dL — ABNORMAL HIGH (ref 70–99)
Potassium: 3.8 mmol/L (ref 3.5–5.1)
Sodium: 138 mmol/L (ref 135–145)

## 2024-08-24 LAB — APTT: aPTT: 30 s (ref 24–36)

## 2024-08-24 LAB — TROPONIN I (HIGH SENSITIVITY)
Troponin I (High Sensitivity): 47 ng/L — ABNORMAL HIGH (ref <18)
Troponin I (High Sensitivity): 43 ng/L — ABNORMAL HIGH (ref <18)

## 2024-08-24 LAB — CBC
HCT: 45.7 % (ref 36.0–46.0)
Hemoglobin: 14.7 g/dL (ref 12.0–15.0)
MCH: 29.3 pg (ref 26.0–34.0)
MCHC: 32.2 g/dL (ref 30.0–36.0)
MCV: 91 fL (ref 80.0–100.0)
Platelets: 275 K/uL (ref 150–400)
RBC: 5.02 MIL/uL (ref 3.87–5.11)
RDW: 14.6 % (ref 11.5–15.5)
WBC: 16.1 K/uL — ABNORMAL HIGH (ref 4.0–10.5)
nRBC: 0 % (ref 0.0–0.2)

## 2024-08-24 LAB — HIV ANTIBODY (ROUTINE TESTING W REFLEX): HIV Screen 4th Generation wRfx: NONREACTIVE

## 2024-08-24 LAB — CBG MONITORING, ED
Glucose-Capillary: 166 mg/dL — ABNORMAL HIGH (ref 70–99)
Glucose-Capillary: 108 mg/dL — ABNORMAL HIGH (ref 70–99)

## 2024-08-24 LAB — GLUCOSE, CAPILLARY
Glucose-Capillary: 114 mg/dL — ABNORMAL HIGH (ref 70–99)
Glucose-Capillary: 179 mg/dL — ABNORMAL HIGH (ref 70–99)

## 2024-08-24 LAB — PROTIME-INR
INR: 1 (ref 0.8–1.2)
Prothrombin Time: 13.6 s (ref 11.4–15.2)

## 2024-08-24 LAB — D-DIMER, QUANTITATIVE: D-Dimer, Quant: 0.37 ug{FEU}/mL (ref 0.00–0.50)

## 2024-08-24 LAB — HEPARIN LEVEL (UNFRACTIONATED): Heparin Unfractionated: >1.1 [IU]/mL — ABNORMAL HIGH (ref 0.30–0.70)

## 2024-08-24 SURGERY — LEFT HEART CATH AND CORONARY ANGIOGRAPHY
Anesthesia: Moderate Sedation

## 2024-08-24 MED ORDER — ASPIRIN 81 MG PO CHEW: 81.0000 mg | CHEWABLE_TABLET | ORAL | Status: DC

## 2024-08-24 MED ORDER — INSULIN GLARGINE-YFGN 100 UNIT/ML ~~LOC~~ SOLN
20.0000 [IU] | Freq: Every day | SUBCUTANEOUS | Status: DC
  Administered 2024-08-24 – 2024-08-25 (×2): 20 [IU] via SUBCUTANEOUS
  Filled 2024-08-24 (×2): qty 0.2

## 2024-08-24 MED ORDER — HEPARIN SODIUM (PORCINE) 1000 UNIT/ML IJ SOLN
INTRAMUSCULAR | Status: DC | PRN
  Administered 2024-08-24: 3500 [IU] via INTRAVENOUS

## 2024-08-24 MED ORDER — HEPARIN (PORCINE) IN NACL 1000-0.9 UT/500ML-% IV SOLN
INTRAVENOUS | Status: AC
  Filled 2024-08-24: qty 1000

## 2024-08-24 MED ORDER — NITROGLYCERIN 0.4 MG SL SUBL
0.4000 mg | SUBLINGUAL_TABLET | SUBLINGUAL | Status: DC | PRN
  Filled 2024-08-24: qty 1

## 2024-08-24 MED ORDER — NICOTINE 21 MG/24HR TD PT24
21.0000 mg | MEDICATED_PATCH | Freq: Every day | TRANSDERMAL | Status: DC
  Filled 2024-08-24 (×2): qty 1

## 2024-08-24 MED ORDER — FENTANYL CITRATE (PF) 100 MCG/2ML IJ SOLN
INTRAMUSCULAR | Status: AC
  Filled 2024-08-24: qty 2

## 2024-08-24 MED ORDER — ASPIRIN 81 MG PO TBEC
81.0000 mg | DELAYED_RELEASE_TABLET | Freq: Every day | ORAL | Status: DC
  Administered 2024-08-25: 81 mg via ORAL
  Filled 2024-08-24: qty 1

## 2024-08-24 MED ORDER — ACETAMINOPHEN 325 MG PO TABS: 650.0000 mg | ORAL_TABLET | ORAL | Status: DC | PRN

## 2024-08-24 MED ORDER — HEPARIN SODIUM (PORCINE) 1000 UNIT/ML IJ SOLN
INTRAMUSCULAR | Status: AC
  Filled 2024-08-24: qty 10

## 2024-08-24 MED ORDER — ACETAMINOPHEN 500 MG PO TABS: 500.0000 mg | ORAL_TABLET | Freq: Four times a day (QID) | ORAL | Status: DC | PRN

## 2024-08-24 MED ORDER — ENOXAPARIN SODIUM 40 MG/0.4ML IJ SOSY
40.0000 mg | PREFILLED_SYRINGE | INTRAMUSCULAR | Status: DC
  Administered 2024-08-25: 40 mg via SUBCUTANEOUS
  Filled 2024-08-24 (×3): qty 0.4

## 2024-08-24 MED ORDER — ASPIRIN 81 MG PO CHEW
CHEWABLE_TABLET | ORAL | Status: AC
  Filled 2024-08-24: qty 1

## 2024-08-24 MED ORDER — SODIUM CHLORIDE 0.9% FLUSH
3.0000 mL | INTRAVENOUS | Status: DC | PRN
Start: 2024-08-24 — End: 2024-08-25

## 2024-08-24 MED ORDER — BUSPIRONE HCL 10 MG PO TABS
5.0000 mg | ORAL_TABLET | Freq: Two times a day (BID) | ORAL | Status: DC
  Administered 2024-08-24 – 2024-08-25 (×3): 5 mg via ORAL
  Filled 2024-08-24 (×3): qty 1

## 2024-08-24 MED ORDER — HEPARIN (PORCINE) 25000 UT/250ML-% IV SOLN
750.0000 [IU]/h | INTRAVENOUS | Status: DC
  Administered 2024-08-24: 750 [IU]/h via INTRAVENOUS

## 2024-08-24 MED ORDER — NITROGLYCERIN 1 MG/10 ML FOR IR/CATH LAB
INTRA_ARTERIAL | Status: DC | PRN
  Administered 2024-08-24: 100 ug via INTRACORONARY

## 2024-08-24 MED ORDER — CLONAZEPAM 0.5 MG PO TABS: 0.5000 mg | ORAL_TABLET | Freq: Two times a day (BID) | ORAL | Status: DC | PRN

## 2024-08-24 MED ORDER — LIDOCAINE HCL 1 % IJ SOLN
INTRAMUSCULAR | Status: AC
  Filled 2024-08-24: qty 20

## 2024-08-24 MED ORDER — HYDRALAZINE HCL 20 MG/ML IJ SOLN: 10.0000 mg | INTRAMUSCULAR | Status: AC | PRN

## 2024-08-24 MED ORDER — SIMVASTATIN 10 MG PO TABS: 20.0000 mg | ORAL_TABLET | Freq: Every day | ORAL | Status: DC

## 2024-08-24 MED ORDER — MIDAZOLAM HCL (PF) 2 MG/2ML IJ SOLN
INTRAMUSCULAR | Status: DC | PRN
  Administered 2024-08-24: 1 mg via INTRAVENOUS

## 2024-08-24 MED ORDER — ASPIRIN 81 MG PO CHEW
324.0000 mg | CHEWABLE_TABLET | Freq: Once | ORAL | Status: AC
  Administered 2024-08-24: 324 mg via ORAL
  Filled 2024-08-24: qty 4

## 2024-08-24 MED ORDER — FREE WATER
500.0000 mL | Freq: Once | Status: AC
  Administered 2024-08-24: 500 mL via ORAL
  Filled 2024-08-24: qty 500

## 2024-08-24 MED ORDER — INSULIN ASPART 100 UNIT/ML IJ SOLN: 0.0000 [IU] | Freq: Three times a day (TID) | INTRAMUSCULAR | Status: DC

## 2024-08-24 MED ORDER — FENTANYL CITRATE (PF) 100 MCG/2ML IJ SOLN
INTRAMUSCULAR | Status: DC | PRN
  Administered 2024-08-24: 25 ug via INTRAVENOUS

## 2024-08-24 MED ORDER — VERAPAMIL HCL 2.5 MG/ML IV SOLN
INTRAVENOUS | Status: AC
  Filled 2024-08-24: qty 2

## 2024-08-24 MED ORDER — HEPARIN BOLUS VIA INFUSION
4000.0000 [IU] | Freq: Once | INTRAVENOUS | Status: AC
  Administered 2024-08-24: 4000 [IU] via INTRAVENOUS
  Filled 2024-08-24: qty 4000

## 2024-08-24 MED ORDER — LISINOPRIL 5 MG PO TABS
5.0000 mg | ORAL_TABLET | Freq: Every day | ORAL | Status: DC
  Filled 2024-08-24: qty 1

## 2024-08-24 MED ORDER — CITALOPRAM HYDROBROMIDE 20 MG PO TABS
20.0000 mg | ORAL_TABLET | Freq: Every day | ORAL | Status: DC
  Administered 2024-08-24 – 2024-08-25 (×2): 20 mg via ORAL
  Filled 2024-08-24 (×2): qty 1

## 2024-08-24 MED ORDER — MIDAZOLAM HCL 2 MG/2ML IJ SOLN
INTRAMUSCULAR | Status: AC
  Filled 2024-08-24: qty 2

## 2024-08-24 MED ORDER — HEPARIN (PORCINE) IN NACL 1000-0.9 UT/500ML-% IV SOLN
INTRAVENOUS | Status: DC | PRN
  Administered 2024-08-24: 1000 mL

## 2024-08-24 MED ORDER — ATORVASTATIN CALCIUM 20 MG PO TABS
40.0000 mg | ORAL_TABLET | Freq: Every day | ORAL | Status: DC
  Administered 2024-08-24: 40 mg via ORAL
  Filled 2024-08-24: qty 2

## 2024-08-24 MED ORDER — MECLIZINE HCL 25 MG PO TABS
25.0000 mg | ORAL_TABLET | Freq: Three times a day (TID) | ORAL | Status: DC | PRN
Start: 2024-08-24 — End: 2024-08-25

## 2024-08-24 MED ORDER — IOHEXOL 300 MG/ML  SOLN
INTRAMUSCULAR | Status: DC | PRN
  Administered 2024-08-24: 41 mL

## 2024-08-24 MED ORDER — SODIUM CHLORIDE 0.9 % IV SOLN: INTRAVENOUS | Status: AC

## 2024-08-24 MED ORDER — ONDANSETRON HCL 4 MG/2ML IJ SOLN: 4.0000 mg | Freq: Four times a day (QID) | INTRAMUSCULAR | Status: DC | PRN

## 2024-08-24 MED ORDER — NITROGLYCERIN 1 MG/10 ML FOR IR/CATH LAB
INTRA_ARTERIAL | Status: AC
  Filled 2024-08-24: qty 10

## 2024-08-24 MED ORDER — GABAPENTIN 300 MG PO CAPS
300.0000 mg | ORAL_CAPSULE | Freq: Every day | ORAL | Status: DC
  Administered 2024-08-24: 300 mg via ORAL
  Filled 2024-08-24: qty 1

## 2024-08-24 MED ORDER — INSULIN ASPART 100 UNIT/ML IJ SOLN
0.0000 [IU] | Freq: Three times a day (TID) | INTRAMUSCULAR | Status: DC
  Administered 2024-08-24: 3 [IU] via SUBCUTANEOUS
  Administered 2024-08-25: 2 [IU] via SUBCUTANEOUS
  Filled 2024-08-24: qty 1

## 2024-08-24 MED ORDER — HEPARIN (PORCINE) 25000 UT/250ML-% IV SOLN
850.0000 [IU]/h | INTRAVENOUS | Status: DC
  Administered 2024-08-24: 850 [IU]/h via INTRAVENOUS
  Filled 2024-08-24: qty 250

## 2024-08-24 MED ORDER — PANTOPRAZOLE SODIUM 40 MG PO TBEC
40.0000 mg | DELAYED_RELEASE_TABLET | Freq: Every day | ORAL | Status: DC
  Administered 2024-08-24 – 2024-08-25 (×2): 40 mg via ORAL
  Filled 2024-08-24 (×2): qty 1

## 2024-08-24 MED ORDER — METOPROLOL TARTRATE 25 MG PO TABS
12.5000 mg | ORAL_TABLET | Freq: Two times a day (BID) | ORAL | Status: DC
  Administered 2024-08-24 – 2024-08-25 (×2): 12.5 mg via ORAL
  Filled 2024-08-24 (×2): qty 1

## 2024-08-24 MED ORDER — VERAPAMIL HCL 2.5 MG/ML IV SOLN
INTRAVENOUS | Status: DC | PRN
  Administered 2024-08-24 (×2): 2.5 mg via INTRA_ARTERIAL

## 2024-08-24 MED ORDER — SODIUM CHLORIDE 0.9% FLUSH
3.0000 mL | Freq: Two times a day (BID) | INTRAVENOUS | Status: DC
Start: 2024-08-24 — End: 2024-08-25
  Administered 2024-08-24 – 2024-08-25 (×2): 3 mL via INTRAVENOUS

## 2024-08-24 MED ORDER — SODIUM CHLORIDE 0.9 % IV SOLN: 250.0000 mL | INTRAVENOUS | Status: DC | PRN

## 2024-08-24 MED ORDER — LIDOCAINE HCL (PF) 1 % IJ SOLN
INTRAMUSCULAR | Status: DC | PRN
  Administered 2024-08-24: 2 mL

## 2024-08-24 SURGICAL SUPPLY — 10 items
CATH 5F 110X4 TIG (CATHETERS) IMPLANT
CATH INFINITI 5FR ANG PIGTAIL (CATHETERS) IMPLANT
DEVICE RAD TR BAND REGULAR (VASCULAR PRODUCTS) IMPLANT
DRAPE BRACHIAL (DRAPES) IMPLANT
GLIDESHEATH SLEND SS 6F .021 (SHEATH) IMPLANT
GUIDEWIRE INQWIRE 1.5J.035X260 (WIRE) IMPLANT
PACK CARDIAC CATH (CUSTOM PROCEDURE TRAY) ×1 IMPLANT
SET ATX-X65L (MISCELLANEOUS) IMPLANT
STATION PROTECTION PRESSURIZED (MISCELLANEOUS) IMPLANT
WIRE HITORQ VERSACORE ST 145CM (WIRE) IMPLANT

## 2024-08-24 NOTE — Progress Notes (Signed)
 ANTICOAGULATION CONSULT NOTE  Pharmacy Consult for heparin infusion Indication: ACS/STEMI  Allergies  Allergen Reactions   Sulfa Antibiotics Hives    Patient Measurements: Height: 5' 3 (160 cm) Weight: 70.3 kg (155 lb) IBW/kg (Calculated) : 52.4 HEPARIN DW (KG): 66.9  Vital Signs: Temp: 97.7 F (36.5 C) (10/21 0017) Temp Source: Oral (10/21 0017) BP: 116/69 (10/21 0017) Pulse Rate: 122 (10/21 0017)  Labs: Recent Labs    08/24/24 0013  HGB 14.7  HCT 45.7  PLT 275  CREATININE 0.86  TROPONINIHS 47*    Estimated Creatinine Clearance: 62.2 mL/min (by C-G formula based on SCr of 0.86 mg/dL).   Medical History: Past Medical History:  Diagnosis Date   Anxiety    Arthritis 2019   just found by x-ray   Cataract 2018   monitoring   Chicken pox    Depression    Diabetes mellitus without complication (HCC)    Frequent headaches    Hyperlipidemia    Hypertension 2018   fluctuates   Neuromuscular disorder (HCC) 2017   medication treatment    Assessment: Pt is a 63 yo female presenting to ED c/o sharp cp that radiates to left shoulder w/ sob, nausea, and weakness, found with elevated Troponin I level.  Goal of Therapy:  Heparin level 0.3-0.7 units/ml Monitor platelets by anticoagulation protocol: Yes   Plan:  Bolus 4000 units x 1 Start heparin infusion at 850 units/hr Will check HL in 6 hr after start of infusion CBC daily while on heparin  Ketzaly Cardella S. Railee Bonillas, PharmD, [facility] 08/24/2024 2:25 AM

## 2024-08-24 NOTE — H&P (Signed)
 History and Physical    Brittany Pruitt FMW:[id number] DOB: [birth year] DOA: 08/24/2024  PCP: Antonette Angeline ORN, NP (Confirm with patient/family/NH records and if not entered, this has to be entered at [facility] point of entry) Patient coming from: Home  I have personally briefly reviewed patient's old medical records in [facility]  Chief Complaint: Chest pain  HPI: Brittany Pruitt is a 63 y.o. female with medical history significant of GERD, IIDM, HTN, HLD, cigarette smoking, presented with new onset chest pain.  Symptoms started last night patient was sleeping, around 10 PM, patient started to have severe  tightness in the chest 8/10, radiating to left side up neck and shoulder, associated with shortness of breath, seems that there was no exacerbation or relieving factors.  Pain subsided after EMS arrived around midnight.  Patient leads a sedentary life, but she does have shortness of breath associated with even mild gardening work.  ED Course: Patient was found to be tachycardia heart rate in the 120s on arrival improved to 70s, blood pressure 109/58 O2 saturation 94% on room air.  Troponin trending 47> 43, BUN 23 creatinine 0.8 WBC 16.1 compared to baseline 12/14, hemoglobin 14.7.  Chest x-ray negative for acute findings EKG showed sinus rhythm no acute ST changes.  Patient was started on heparin drip in the ED.  Review of Systems: As per HPI otherwise 14 point review of systems negative.    Past Medical History:  Diagnosis Date   Anxiety    Arthritis 2019   just found by x-ray   Cataract 2018   monitoring   Chicken pox    Depression    Diabetes mellitus without complication (HCC)    Frequent headaches    Hyperlipidemia    Hypertension 2018   fluctuates   Neuromuscular disorder (HCC) 2017   medication treatment    Past Surgical History:  Procedure Laterality Date   ABDOMINAL HYSTERECTOMY  2013   total   BREAST BIOPSY Left 2013   benign   CESAREAN SECTION      COLONOSCOPY WITH PROPOFOL  N/A 02/09/2016   Procedure: COLONOSCOPY WITH PROPOFOL ;  Surgeon: Deward CINDERELLA Piedmont, MD;  Location: [facility] ENDOSCOPY;  Service: Gastroenterology;  Laterality: N/A;   TONSILLECTOMY AND ADENOIDECTOMY     TUBAL LIGATION  1995     reports that she has been smoking cigarettes. She has a 30 pack-year smoking history. She has never used smokeless tobacco. She reports that she does not drink alcohol and does not use drugs.  Allergies  Allergen Reactions   Sulfa Antibiotics Hives    Family History  Problem Relation Age of Onset   Uterine cancer Mother    Breast cancer Mother 59   Pancreatic cancer Mother    Arthritis Mother    Cancer Mother    Hyperlipidemia Mother    Heart disease Father    Hypertension Father    Anxiety disorder Father    Diabetes Father    Arthritis Father    COPD Father    Depression Father    Hearing loss Father    Hyperlipidemia Father    Obesity Father    Vision loss Father    Uterine cancer Maternal Aunt    Breast cancer Maternal Aunt 60   Heart disease Paternal Grandfather    Diabetes Paternal Grandfather    Early death Paternal Grandfather    Hearing loss Paternal Grandfather    Miscarriages / Stillbirths Maternal Grandmother    Varicose Veins Paternal Grandmother    Anxiety  disorder Daughter    Cancer Maternal Aunt      Prior to Admission medications   Medication Sig Start Date End Date Taking? Authorizing Provider  acetaminophen  (TYLENOL ) 500 MG tablet Take 500 mg by mouth every 6 (six) hours as needed for moderate pain.   Yes [provider]  aspirin  EC 81 MG tablet Take 1 tablet (81 mg total) by mouth daily. Swallow whole. 01/06/24  Yes Antonette Angeline ORN, NP  busPIRone  (BUSPAR ) 5 MG tablet TAKE 1 TABLET TWICE A DAY 07/30/24  Yes Antonette Angeline ORN, NP  Cholecalciferol (VITAMIN D3) 50 MCG (2000 UT) capsule Take 2,000 Units by mouth daily.   Yes [provider]  citalopram  (CELEXA ) 20 MG tablet TAKE 1 TABLET DAILY 07/30/24   Yes [name], Angeline ORN, NP  clonazePAM  (KLONOPIN ) 0.5 MG tablet TAKE ONE-HALF (1/2) TO ONE TABLET TWICE A DAY AS NEEDED FOR ANXIETY 08/02/24  Yes [name], Angeline ORN, NP  cyanocobalamin (VITAMIN B12) 1000 MCG tablet Take 1,000 mcg by mouth daily.   Yes [provider]  gabapentin  (NEURONTIN ) 300 MG capsule TAKE 1 CAPSULE FOUR TIMES A DAY 07/30/24  Yes Antonette Angeline ORN, NP  glipiZIDE  (GLUCOTROL ) 5 MG tablet TAKE 1 TABLET TWICE A DAY BEFORE MEALS 05/20/24  Yes Antonette Angeline ORN, NP  LANTUS  SOLOSTAR 100 UNIT/ML Solostar Pen INJECT 35 UNITS UNDER THE SKIN TWICE A DAY 05/20/23  Yes Antonette Angeline ORN, NP  lisinopril  (ZESTRIL ) 5 MG tablet TAKE 1 TABLET DAILY 07/30/24  Yes Antonette Angeline ORN, NP  meclizine  (ANTIVERT ) 25 MG tablet TAKE 1 TABLET THREE TIMES A DAY AS NEEDED FOR DIZZINESS 01/05/24  Yes [name], Angeline ORN, NP  Multiple Vitamins-Minerals (CENTRUM SILVER 50+WOMEN PO) Take 1 tablet by mouth daily.   Yes [provider]  Omega-3 Fatty Acids (FISH OIL) 1000 MG CAPS Take 1 capsule by mouth in the morning and at bedtime.   Yes [provider]  omeprazole  (PRILOSEC) 20 MG capsule TAKE 1 CAPSULE DAILY 02/10/24  Yes [name], Angeline ORN, NP  Semaglutide, 1 MG/DOSE, (OZEMPIC, 1 MG/DOSE,) 4 MG/3ML SOPN Inject 1 mg into the skin once a week.   Yes [provider]  simvastatin  (ZOCOR ) 20 MG tablet TAKE 1 TABLET DAILY AT 6 P.M. 05/06/24  Yes [name], Angeline ORN, NP  benzonatate  (TESSALON ) 100 MG capsule Take 1-2 capsules (100-200 mg total) by mouth 3 (three) times daily as needed. Patient not taking: Reported on 08/24/2024 02/13/24   Vivienne Delon HERO, PA-C  Blood Glucose Monitoring Suppl DEVI 1 each by Does not apply route in the morning, at noon, and at bedtime. May substitute to any manufacturer covered by patient's insurance. 03/11/24   Antonette Angeline ORN, NP    Physical Exam: Vitals:   08/24/24 0830 08/24/24 0851 08/24/24 0900 08/24/24 0930  BP: 105/61  (!) 109/58 (!) 104/48  Pulse: 69  69 64  Resp: 15   16 11   Temp:  97.8 F (36.6 C)    TempSrc:  Oral    SpO2: 93%  93% 93%  Weight:      Height:        Constitutional: NAD, calm, comfortable Vitals:   08/24/24 0830 08/24/24 0851 08/24/24 0900 08/24/24 0930  BP: 105/61  (!) 109/58 (!) 104/48  Pulse: 69  69 64  Resp: 15  16 11   Temp:  97.8 F (36.6 C)    TempSrc:  Oral    SpO2: 93%  93% 93%  Weight:      Height:  Eyes: PERRL, lids and conjunctivae normal ENMT: Mucous membranes are moist. Posterior pharynx clear of any exudate or lesions.Normal dentition.  Neck: normal, supple, no masses, no thyromegaly Respiratory: clear to auscultation bilaterally, no wheezing, no crackles. Normal respiratory effort. No accessory muscle use.  Cardiovascular: Regular rate and rhythm, no murmurs / rubs / gallops. No extremity edema. 2+ pedal pulses. No carotid bruits.  Abdomen: no tenderness, no masses palpated. No hepatosplenomegaly. Bowel sounds positive.  Musculoskeletal: no clubbing / cyanosis. No joint deformity upper and lower extremities. Good ROM, no contractures. Normal muscle tone.  Skin: no rashes, lesions, ulcers. No induration Neurologic: CN 2-12 grossly intact. Sensation intact, DTR normal. Strength 5/5 in all 4.  Psychiatric: Normal judgment and insight. Alert and oriented x 3. Normal mood.     Labs on Admission: I have personally reviewed following labs and imaging studies  CBC: Recent Labs  Lab 08/24/24 0013  WBC 16.1*  HGB 14.7  HCT 45.7  MCV 91.0  PLT 275   Basic Metabolic Panel: Recent Labs  Lab 08/24/24 0013  NA 138  K 3.8  CL 103  CO2 22  GLUCOSE 177*  BUN 22  CREATININE 0.86  CALCIUM 9.5   GFR: Estimated Creatinine Clearance: 62.2 mL/min (by C-G formula based on SCr of 0.86 mg/dL). Liver Function Tests: No results for input(s): AST, ALT, ALKPHOS, BILITOT, PROT, ALBUMIN in the last 168 hours. No results for input(s): LIPASE, AMYLASE in the last 168 hours. No results for input(s):  AMMONIA in the last 168 hours. Coagulation Profile: Recent Labs  Lab 08/24/24 0241  INR 1.0   Cardiac Enzymes: No results for input(s): CKTOTAL, CKMB, CKMBINDEX, TROPONINI in the last 168 hours. BNP (last 3 results) No results for input(s): PROBNP in the last 8760 hours. HbA1C: No results for input(s): HGBA1C in the last 72 hours. CBG: Recent Labs  Lab 08/24/24 0827  GLUCAP 166*   Lipid Profile: No results for input(s): CHOL, HDL, LDLCALC, TRIG, CHOLHDL, LDLDIRECT in the last 72 hours. Thyroid Function Tests: No results for input(s): TSH, T4TOTAL, FREET4, T3FREE, THYROIDAB in the last 72 hours. Anemia Panel: No results for input(s): VITAMINB12, FOLATE, FERRITIN, TIBC, IRON, RETICCTPCT in the last 72 hours. Urine analysis:    Component Value Date/Time   COLORURINE YELLOW (A) 10/29/2022 1629   APPEARANCEUR CLEAR (A) 10/29/2022 1629   LABSPEC 1.030 10/29/2022 1629   PHURINE 6.0 10/29/2022 1629   GLUCOSEU >=500 (A) 10/29/2022 1629   HGBUR SMALL (A) 10/29/2022 1629   BILIRUBINUR NEGATIVE 10/29/2022 1629   KETONESUR 20 (A) 10/29/2022 1629   PROTEINUR 30 (A) 10/29/2022 1629   NITRITE NEGATIVE 10/29/2022 1629   LEUKOCYTESUR NEGATIVE 10/29/2022 1629    Radiological Exams on Admission: DG Chest 2 View Result Date: 08/24/2024 EXAM: 2 VIEW(S) XRAY OF THE CHEST 08/24/2024 12:42:00 AM COMPARISON: None available. CLINICAL HISTORY: sob, cp. sharp chest pain that radiates to left shoulder, SOB, nausea, and weakness. Hx of diabetes, HTN, and current smoker. FINDINGS: LUNGS AND PLEURA: No focal pulmonary opacity. No pulmonary edema. No pleural effusion. No pneumothorax. HEART AND MEDIASTINUM: Atherosclerotic plaque. BONES AND SOFT TISSUES: No acute osseous abnormality. IMPRESSION: 1. No acute cardiopulmonary process. Electronically signed by: Morgane Naveau MD 08/24/2024 12:50 AM EDT RP Workstation: [id number]    EKG: Independently reviewed.   Sinus rhythm, chronic RBBB, no acute ST changes.  Assessment/Plan Principal Problem:   ACS (acute coronary syndrome) [facility]) Active Problems:   Unstable angina (HCC)   Chest pain  (please populate well all  problems here in Problem List. (For example, if patient is on BP meds at home and you resume or decide to hold them, it is a problem that needs to be her. Same for CAD, COPD, HLD and so on)  Anginal-like chest pain - Agreed with ACS medication including heparin drip - Consulted cardiology - Echocardiogram is pending - Risk factor modification, cigarette smoking cessation education performed at bedside.  Check lipid panel.  Leukocytosis - Appears to be chronic, denies any cough dysuria diarrhea, monitor off antibiotics.  Cigarette smoking - Cessation education performed at bedside - Nicotine  patch  IIDM -SSI for now.  HTN HLD - Continue lisinopril  - Continue statin  DVT prophylaxis: Heparin drip Code Status: Full code Family Communication: Daughter at bedside Disposition Plan: Expect less than 2 midnight hospital stay Consults called: Cardiology Admission status: Telemetry observation   Cort ONEIDA Mana MD [facility] Pager [phone]  08/24/2024, 9:46 AM

## 2024-08-24 NOTE — ED Triage Notes (Addendum)
 Pt arrives POV, ambulatory to triage, no acute distress noted, c/o sharp cp that radiates to left shoulder w/ sob, nausea, and weakness. Symptoms started around 2230 tonight. Pt took 324 mg of ASA at home pta.

## 2024-08-24 NOTE — ED Notes (Signed)
 Cardiology team at bedside

## 2024-08-24 NOTE — Consult Note (Signed)
 Cardiology Consultation   Patient ID: Brittany Pruitt MRN: [id number]; DOB: 1961-06-09  Admit date: 08/24/2024 Date of Consult: 08/24/2024  PCP:  Antonette Angeline ORN, NP   [facility] Providers Cardiologist:  None        Patient Profile: Brittany Pruitt is a 63 y.o. female with a hx of GERD, type 2 diabetes, hypertension, hyperlipidemia, and going tobacco use who is being seen 08/24/2024 for the evaluation of chest pain at the request of Dr. Laurita.  History of Present Illness: [name] was in her usual state of health until yesterday evening when she was laying down to sleep.  She reports that she felt like she could not get comfortable and had laid on her left side wrong causing pain.  She kept trying to sleep but noticed the pain getting worse.  She describes it as a tightness.  It started radiating into her left arm and up into her left jaw.  It was associated with shortness of breath and nausea.  Denies associated diaphoresis, lightheadedness, and dizziness. It became more severe, she rates it a 7-8 out of 10.  She called EMS for further evaluation.  She was given aspirin  without improvement in symptoms.  They could not give her nitroglycerin due to low blood pressure.  In the ED, HR 122 with otherwise normal vital signs.  WBC elevated at 16.1 with otherwise unremarkable CBC and BMP.  Troponin 47 > 43.  EKG shows normal sinus rhythm without acute ischemic changes, first-degree AV block and nonspecific T wave abnormalities are noted.  Patient was started on heparin gtt.  Cardiology was asked to consult for further evaluation of chest pain.  At time of cardiology consult, patient reports ongoing left-sided chest discomfort.  She now rates it about a 2 out of 10 in severity with radiation into her left neck and shoulder.   Patient denies any personal history of heart disease and has not seen a cardiologist in the past. Patient reports that she is fairly sedentary at baseline.  She  does do some yard work and housework.  She reports exertional dyspnea which has been ongoing for some time and she attributes to tobacco use.  She denies exertional chest pain.  She has ongoing tobacco use of 1 to 1-1/2 packs of cigarettes per day for the past 35+ years. No alcohol or illicit drug use.    Past Medical History:  Diagnosis Date   Anxiety    Arthritis 2019   just found by x-ray   Cataract 2018   monitoring   Chicken pox    Depression    Diabetes mellitus without complication (HCC)    Frequent headaches    Hyperlipidemia    Hypertension 2018   fluctuates   Neuromuscular disorder (HCC) 2017   medication treatment    Past Surgical History:  Procedure Laterality Date   ABDOMINAL HYSTERECTOMY  2013   total   BREAST BIOPSY Left 2013   benign   CESAREAN SECTION     COLONOSCOPY WITH PROPOFOL  N/A 02/09/2016   Procedure: COLONOSCOPY WITH PROPOFOL ;  Surgeon: Deward CINDERELLA Piedmont, MD;  Location: [facility] ENDOSCOPY;  Service: Gastroenterology;  Laterality: N/A;   TONSILLECTOMY AND ADENOIDECTOMY     TUBAL LIGATION  1995       Scheduled Meds:  [START ON 08/25/2024] aspirin  EC  81 mg Oral Daily   atorvastatin  40 mg Oral Daily   busPIRone   5 mg Oral BID   citalopram   20 mg Oral Daily  gabapentin   300 mg Oral QHS   insulin  aspart  0-15 Units Subcutaneous TID WC   insulin  glargine-yfgn  20 Units Subcutaneous Daily   lisinopril   5 mg Oral Daily   nicotine   21 mg Transdermal Daily   pantoprazole  40 mg Oral Daily   Continuous Infusions:  heparin 850 Units/hr (08/24/24 0717)   PRN Meds: acetaminophen , clonazePAM , meclizine , nitroGLYCERIN, ondansetron  (ZOFRAN ) IV  Allergies:    Allergies  Allergen Reactions   Sulfa Antibiotics Hives    Social History:   Social History   Socioeconomic History   Marital status: Married    Spouse name: Brittany Pruitt   Number of children: Not on file   Years of education: Not on file   Highest education level: 12th grade  Occupational  History   Not on file  Tobacco Use   Smoking status: Some Days    Current packs/day: 0.00    Average packs/day: 1 pack/day for 30.0 years (30.0 ttl pk-yrs)    Types: Cigarettes    Last attempt to quit: 01/14/2023    Years since quitting: 1.6   Smokeless tobacco: Never  Vaping Use   Vaping status: Never Used  Substance and Sexual Activity   Alcohol use: No    Alcohol/week: 0.0 standard drinks of alcohol   Drug use: Never   Sexual activity: Yes    Partners: Male  Other Topics Concern   Not on file  Social History Narrative   Pt lives with husband Ozell.  Is not currently working.    Social Drivers of Corporate investment banker Strain: Low Risk  (07/07/2024)   Overall Financial Resource Strain (CARDIA)    Difficulty of Paying Living Expenses: Not very hard  Food Insecurity: No Food Insecurity (08/24/2024)   Hunger Vital Sign    Worried About Running Out of Food in the Last Year: Never true    Ran Out of Food in the Last Year: Never true  Transportation Needs: No Transportation Needs (08/24/2024)   PRAPARE - Administrator, Civil Service (Medical): No    Lack of Transportation (Non-Medical): No  Physical Activity: Unknown (07/07/2024)   Exercise Vital Sign    Days of Exercise per Week: Patient declined    Minutes of Exercise per Session: Not on file  Stress: Stress Concern Present (07/07/2024)   [employer] of Occupational Health - Occupational Stress Questionnaire    Feeling of Stress: To some extent  Social Connections: Moderately Isolated (08/24/2024)   Social Connection and Isolation Panel    Frequency of Communication with Friends and Family: More than three times a week    Frequency of Social Gatherings with Friends and Family: Twice a week    Attends Religious Services: Patient declined    Database administrator or Organizations: No    Attends Banker Meetings: Never    Marital Status: Married  Catering manager Violence: Not At Risk  (08/24/2024)   Humiliation, Afraid, Rape, and Kick questionnaire    Fear of Current or Ex-Partner: No    Emotionally Abused: No    Physically Abused: No    Sexually Abused: No    Family History:    Family History  Problem Relation Age of Onset   Uterine cancer Mother    Breast cancer Mother 67   Pancreatic cancer Mother    Arthritis Mother    Cancer Mother    Hyperlipidemia Mother    Heart disease Father    Hypertension Father  Anxiety disorder Father    Diabetes Father    Arthritis Father    COPD Father    Depression Father    Hearing loss Father    Hyperlipidemia Father    Obesity Father    Vision loss Father    Uterine cancer Maternal Aunt    Breast cancer Maternal Aunt 60   Heart disease Paternal Grandfather    Diabetes Paternal Grandfather    Early death Paternal Grandfather    Hearing loss Paternal Grandfather    Miscarriages / Stillbirths Maternal Grandmother    Varicose Veins Paternal Grandmother    Anxiety disorder Daughter    Cancer Maternal Aunt      ROS:  Please see the history of present illness.   All other ROS reviewed and negative.     Physical Exam/Data: Vitals:   08/24/24 0851 08/24/24 0900 08/24/24 0930 08/24/24 1024  BP:  (!) 109/58 (!) 104/48 (!) 95/51  Pulse:  69 64   Resp:  16 11   Temp: 97.8 F (36.6 C)     TempSrc: Oral     SpO2:  93% 93%   Weight:      Height:        Intake/Output Summary (Last 24 hours) at 08/24/2024 1206 Last data filed at 08/24/2024 0500 Gross per 24 hour  Intake 57.5 ml  Output --  Net 57.5 ml      08/24/2024   12:12 AM 07/08/2024   10:43 AM 01/06/2024   10:47 AM  Last 3 Weights  Weight (lbs) 155 lb 155 lb 6.4 oz 160 lb 3.2 oz  Weight (kg) 70.308 kg 70.489 kg 72.666 kg     Body mass index is 27.46 kg/m.  General:  Well nourished, well developed, in no acute distress HEENT: normal Neck: no JVD Vascular: No carotid bruits; Distal pulses 2+ bilaterally Cardiac:  normal S1, S2; RRR; no murmur   Lungs:  clear to auscultation bilaterally, no wheezing, rhonchi or rales  Abd: soft, nontender, no hepatomegaly  Ext: no edema Skin: warm and dry  Psych:  Normal affect   EKG:  The EKG was personally reviewed and demonstrates: Sinus rhythm with first-degree AV block and nonspecific T wave abnormalities, rate 69 bpm Telemetry:  Telemetry was personally reviewed and demonstrates: Sinus rhythm  Laboratory Data: High Sensitivity Troponin:   Recent Labs  Lab 08/24/24 0013 08/24/24 0241  TROPONINIHS 47* 43*     Chemistry Recent Labs  Lab 08/24/24 0013  NA 138  K 3.8  CL 103  CO2 22  GLUCOSE 177*  BUN 22  CREATININE 0.86  CALCIUM 9.5  GFRNONAA >60  ANIONGAP 13    No results for input(s): PROT, ALBUMIN, AST, ALT, ALKPHOS, BILITOT in the last 168 hours. Lipids No results for input(s): CHOL, TRIG, HDL, LABVLDL, LDLCALC, CHOLHDL in the last 168 hours.  Hematology Recent Labs  Lab 08/24/24 0013  WBC 16.1*  RBC 5.02  HGB 14.7  HCT 45.7  MCV 91.0  MCH 29.3  MCHC 32.2  RDW 14.6  PLT 275   Thyroid No results for input(s): TSH, FREET4 in the last 168 hours.  BNPNo results for input(s): BNP, PROBNP in the last 168 hours.  DDimer  Recent Labs  Lab 08/24/24 0241  DDIMER 0.37    Radiology/Studies:  DG Chest 2 View Result Date: 08/24/2024 IMPRESSION: 1. No acute cardiopulmonary process. Electronically signed by: Morgane Naveau MD 08/24/2024 12:50 AM EDT RP Workstation: [id number]   Assessment and Plan:  Unstable angina  Elevated troponin - Presenting with sudden onset left sided chest pain radiating down the left arm and into the left neck - EKG without acute ischemic changes - Troponin 47 > 43 - Started on IV heparin - Echo ordered - Patient continues to have mild left sided chest pain - Patient has several risk factors for CAD including diabetes, hyperlipidemia, and ongoing tobacco use - Discussed options for ischemic evaluation  with patient including stress testing versus heart cath.  Given the patient has ongoing symptoms, shared decision was made to proceed with left heart catheterization later this afternoon.  Hyperlipidemia - Most recent lipid panel 07/2024 with LDL 110 - Will transition to high intensity statin therapy with atorvastatin 40 mg daily  Hypertension - BP low normal - PTA lisinopril  5 mg daily  Tobacco use - 1-1.5 packs/day - Recommend cessation  Informed Consent   Shared Decision Making/Informed Consent The risks [stroke (1 in 1000), death (1 in 1000), kidney failure [usually temporary] (1 in 500), bleeding (1 in 200), allergic reaction [possibly serious] (1 in 200)], benefits (diagnostic support and management of coronary artery disease) and alternatives of a cardiac catheterization were discussed in detail with Ms. Busker and she is willing to proceed.      Risk Assessment/Risk Scores:    TIMI Risk Score for Unstable Angina or Non-ST Elevation MI:   The patient's TIMI risk score is 3, which indicates a 13% risk of all cause mortality, new or recurrent myocardial infarction or need for urgent revascularization in the next 14 days.   For questions or updates, please contact [facility] Please consult [email or web address] for contact info under      Signed, Lesley LITTIE Maffucci, PA-C  08/24/2024 12:06 PM

## 2024-08-24 NOTE — Interval H&P Note (Signed)
 History and Physical Interval Note:  08/24/2024 5:28 PM  Brittany Pruitt  has presented today for surgery, with the diagnosis of NSTEMI.  The various methods of treatment have been discussed with the patient and family. After consideration of risks, benefits and other options for treatment, the patient has consented to  Procedure(s): LEFT HEART CATH AND CORONARY ANGIOGRAPHY (N/A) as a surgical intervention.  The patient's history has been reviewed, patient examined, no change in status, stable for surgery.  I have reviewed the patient's chart and labs.  Questions were answered to the patient's satisfaction.    Cath Lab Visit (complete for each Cath Lab visit)  Clinical Evaluation Leading to the Procedure:   ACS: Yes.    Non-ACS:  N/A  Khye Hochstetler

## 2024-08-24 NOTE — H&P (View-Only) (Signed)
 Cardiology Consultation   Patient ID: Brittany Pruitt MRN: [id number]; DOB: 1961-06-09  Admit date: 08/24/2024 Date of Consult: 08/24/2024  PCP:  Antonette Angeline ORN, NP   [facility] Providers Cardiologist:  None        Patient Profile: Brittany Pruitt is a 63 y.o. female with a hx of GERD, type 2 diabetes, hypertension, hyperlipidemia, and going tobacco use who is being seen 08/24/2024 for the evaluation of chest pain at the request of Dr. Laurita.  History of Present Illness: [name] was in her usual state of health until yesterday evening when she was laying down to sleep.  She reports that she felt like she could not get comfortable and had laid on her left side wrong causing pain.  She kept trying to sleep but noticed the pain getting worse.  She describes it as a tightness.  It started radiating into her left arm and up into her left jaw.  It was associated with shortness of breath and nausea.  Denies associated diaphoresis, lightheadedness, and dizziness. It became more severe, she rates it a 7-8 out of 10.  She called EMS for further evaluation.  She was given aspirin  without improvement in symptoms.  They could not give her nitroglycerin due to low blood pressure.  In the ED, HR 122 with otherwise normal vital signs.  WBC elevated at 16.1 with otherwise unremarkable CBC and BMP.  Troponin 47 > 43.  EKG shows normal sinus rhythm without acute ischemic changes, first-degree AV block and nonspecific T wave abnormalities are noted.  Patient was started on heparin gtt.  Cardiology was asked to consult for further evaluation of chest pain.  At time of cardiology consult, patient reports ongoing left-sided chest discomfort.  She now rates it about a 2 out of 10 in severity with radiation into her left neck and shoulder.   Patient denies any personal history of heart disease and has not seen a cardiologist in the past. Patient reports that she is fairly sedentary at baseline.  She  does do some yard work and housework.  She reports exertional dyspnea which has been ongoing for some time and she attributes to tobacco use.  She denies exertional chest pain.  She has ongoing tobacco use of 1 to 1-1/2 packs of cigarettes per day for the past 35+ years. No alcohol or illicit drug use.    Past Medical History:  Diagnosis Date   Anxiety    Arthritis 2019   just found by x-ray   Cataract 2018   monitoring   Chicken pox    Depression    Diabetes mellitus without complication (HCC)    Frequent headaches    Hyperlipidemia    Hypertension 2018   fluctuates   Neuromuscular disorder (HCC) 2017   medication treatment    Past Surgical History:  Procedure Laterality Date   ABDOMINAL HYSTERECTOMY  2013   total   BREAST BIOPSY Left 2013   benign   CESAREAN SECTION     COLONOSCOPY WITH PROPOFOL  N/A 02/09/2016   Procedure: COLONOSCOPY WITH PROPOFOL ;  Surgeon: Deward CINDERELLA Piedmont, MD;  Location: [facility] ENDOSCOPY;  Service: Gastroenterology;  Laterality: N/A;   TONSILLECTOMY AND ADENOIDECTOMY     TUBAL LIGATION  1995       Scheduled Meds:  [START ON 08/25/2024] aspirin  EC  81 mg Oral Daily   atorvastatin  40 mg Oral Daily   busPIRone   5 mg Oral BID   citalopram   20 mg Oral Daily  gabapentin   300 mg Oral QHS   insulin  aspart  0-15 Units Subcutaneous TID WC   insulin  glargine-yfgn  20 Units Subcutaneous Daily   lisinopril   5 mg Oral Daily   nicotine   21 mg Transdermal Daily   pantoprazole  40 mg Oral Daily   Continuous Infusions:  heparin 850 Units/hr (08/24/24 0717)   PRN Meds: acetaminophen , clonazePAM , meclizine , nitroGLYCERIN, ondansetron  (ZOFRAN ) IV  Allergies:    Allergies  Allergen Reactions   Sulfa Antibiotics Hives    Social History:   Social History   Socioeconomic History   Marital status: Married    Spouse name: Brittany Pruitt   Number of children: Not on file   Years of education: Not on file   Highest education level: 12th grade  Occupational  History   Not on file  Tobacco Use   Smoking status: Some Days    Current packs/day: 0.00    Average packs/day: 1 pack/day for 30.0 years (30.0 ttl pk-yrs)    Types: Cigarettes    Last attempt to quit: 01/14/2023    Years since quitting: 1.6   Smokeless tobacco: Never  Vaping Use   Vaping status: Never Used  Substance and Sexual Activity   Alcohol use: No    Alcohol/week: 0.0 standard drinks of alcohol   Drug use: Never   Sexual activity: Yes    Partners: Male  Other Topics Concern   Not on file  Social History Narrative   Pt lives with husband Brittany Pruitt.  Is not currently working.    Social Drivers of Corporate investment banker Strain: Low Risk  (07/07/2024)   Overall Financial Resource Strain (CARDIA)    Difficulty of Paying Living Expenses: Not very hard  Food Insecurity: No Food Insecurity (08/24/2024)   Hunger Vital Sign    Worried About Running Out of Food in the Last Year: Never true    Ran Out of Food in the Last Year: Never true  Transportation Needs: No Transportation Needs (08/24/2024)   PRAPARE - Administrator, Civil Service (Medical): No    Lack of Transportation (Non-Medical): No  Physical Activity: Unknown (07/07/2024)   Exercise Vital Sign    Days of Exercise per Week: Patient declined    Minutes of Exercise per Session: Not on file  Stress: Stress Concern Present (07/07/2024)   [employer] of Occupational Health - Occupational Stress Questionnaire    Feeling of Stress: To some extent  Social Connections: Moderately Isolated (08/24/2024)   Social Connection and Isolation Panel    Frequency of Communication with Friends and Family: More than three times a week    Frequency of Social Gatherings with Friends and Family: Twice a week    Attends Religious Services: Patient declined    Database administrator or Organizations: No    Attends Banker Meetings: Never    Marital Status: Married  Catering manager Violence: Not At Risk  (08/24/2024)   Humiliation, Afraid, Rape, and Kick questionnaire    Fear of Current or Ex-Partner: No    Emotionally Abused: No    Physically Abused: No    Sexually Abused: No    Family History:    Family History  Problem Relation Age of Onset   Uterine cancer Mother    Breast cancer Mother 67   Pancreatic cancer Mother    Arthritis Mother    Cancer Mother    Hyperlipidemia Mother    Heart disease Father    Hypertension Father  Anxiety disorder Father    Diabetes Father    Arthritis Father    COPD Father    Depression Father    Hearing loss Father    Hyperlipidemia Father    Obesity Father    Vision loss Father    Uterine cancer Maternal Aunt    Breast cancer Maternal Aunt 60   Heart disease Paternal Grandfather    Diabetes Paternal Grandfather    Early death Paternal Grandfather    Hearing loss Paternal Grandfather    Miscarriages / Stillbirths Maternal Grandmother    Varicose Veins Paternal Grandmother    Anxiety disorder Daughter    Cancer Maternal Aunt      ROS:  Please see the history of present illness.   All other ROS reviewed and negative.     Physical Exam/Data: Vitals:   08/24/24 0851 08/24/24 0900 08/24/24 0930 08/24/24 1024  BP:  (!) 109/58 (!) 104/48 (!) 95/51  Pulse:  69 64   Resp:  16 11   Temp: 97.8 F (36.6 C)     TempSrc: Oral     SpO2:  93% 93%   Weight:      Height:        Intake/Output Summary (Last 24 hours) at 08/24/2024 1206 Last data filed at 08/24/2024 0500 Gross per 24 hour  Intake 57.5 ml  Output --  Net 57.5 ml      08/24/2024   12:12 AM 07/08/2024   10:43 AM 01/06/2024   10:47 AM  Last 3 Weights  Weight (lbs) 155 lb 155 lb 6.4 oz 160 lb 3.2 oz  Weight (kg) 70.308 kg 70.489 kg 72.666 kg     Body mass index is 27.46 kg/m.  General:  Well nourished, well developed, in no acute distress HEENT: normal Neck: no JVD Vascular: No carotid bruits; Distal pulses 2+ bilaterally Cardiac:  normal S1, S2; RRR; no murmur   Lungs:  clear to auscultation bilaterally, no wheezing, rhonchi or rales  Abd: soft, nontender, no hepatomegaly  Ext: no edema Skin: warm and dry  Psych:  Normal affect   EKG:  The EKG was personally reviewed and demonstrates: Sinus rhythm with first-degree AV block and nonspecific T wave abnormalities, rate 69 bpm Telemetry:  Telemetry was personally reviewed and demonstrates: Sinus rhythm  Laboratory Data: High Sensitivity Troponin:   Recent Labs  Lab 08/24/24 0013 08/24/24 0241  TROPONINIHS 47* 43*     Chemistry Recent Labs  Lab 08/24/24 0013  NA 138  K 3.8  CL 103  CO2 22  GLUCOSE 177*  BUN 22  CREATININE 0.86  CALCIUM 9.5  GFRNONAA >60  ANIONGAP 13    No results for input(s): PROT, ALBUMIN, AST, ALT, ALKPHOS, BILITOT in the last 168 hours. Lipids No results for input(s): CHOL, TRIG, HDL, LABVLDL, LDLCALC, CHOLHDL in the last 168 hours.  Hematology Recent Labs  Lab 08/24/24 0013  WBC 16.1*  RBC 5.02  HGB 14.7  HCT 45.7  MCV 91.0  MCH 29.3  MCHC 32.2  RDW 14.6  PLT 275   Thyroid No results for input(s): TSH, FREET4 in the last 168 hours.  BNPNo results for input(s): BNP, PROBNP in the last 168 hours.  DDimer  Recent Labs  Lab 08/24/24 0241  DDIMER 0.37    Radiology/Studies:  DG Chest 2 View Result Date: 08/24/2024 IMPRESSION: 1. No acute cardiopulmonary process. Electronically signed by: Morgane Naveau MD 08/24/2024 12:50 AM EDT RP Workstation: [id number]   Assessment and Plan:  Unstable angina  Elevated troponin - Presenting with sudden onset left sided chest pain radiating down the left arm and into the left neck - EKG without acute ischemic changes - Troponin 47 > 43 - Started on IV heparin - Echo ordered - Patient continues to have mild left sided chest pain - Patient has several risk factors for CAD including diabetes, hyperlipidemia, and ongoing tobacco use - Discussed options for ischemic evaluation  with patient including stress testing versus heart cath.  Given the patient has ongoing symptoms, shared decision was made to proceed with left heart catheterization later this afternoon.  Hyperlipidemia - Most recent lipid panel 07/2024 with LDL 110 - Will transition to high intensity statin therapy with atorvastatin 40 mg daily  Hypertension - BP low normal - PTA lisinopril  5 mg daily  Tobacco use - 1-1.5 packs/day - Recommend cessation  Informed Consent   Shared Decision Making/Informed Consent The risks [stroke (1 in 1000), death (1 in 1000), kidney failure [usually temporary] (1 in 500), bleeding (1 in 200), allergic reaction [possibly serious] (1 in 200)], benefits (diagnostic support and management of coronary artery disease) and alternatives of a cardiac catheterization were discussed in detail with Ms. Busker and she is willing to proceed.      Risk Assessment/Risk Scores:    TIMI Risk Score for Unstable Angina or Non-ST Elevation MI:   The patient's TIMI risk score is 3, which indicates a 13% risk of all cause mortality, new or recurrent myocardial infarction or need for urgent revascularization in the next 14 days.   For questions or updates, please contact [facility] Please consult [email or web address] for contact info under      Signed, Lesley LITTIE Maffucci, PA-C  08/24/2024 12:06 PM
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
## 2024-08-24 NOTE — ED Provider Notes (Signed)
 [facility] Provider Note    Event Date/Time   First MD Initiated Contact with Patient 08/24/24 0149     (approximate)   History   Chest Pain   HPI  Brittany Pruitt is a 63 y.o. female with history of hypertension, hyperlipidemia, diabetes, tobacco use who presents to the emergency department with intermittent chest tightness, sharp shooting chest pain, shortness of breath that started today.  No nausea, vomiting, diaphoresis, dizziness, fevers, cough, lower extremity swelling or pain.  No prior history of PE, DVT, ACS.   History provided by patient, family.    Past Medical History:  Diagnosis Date   Anxiety    Arthritis 2019   just found by x-ray   Cataract 2018   monitoring   Chicken pox    Depression    Diabetes mellitus without complication (HCC)    Frequent headaches    Hyperlipidemia    Hypertension 2018   fluctuates   Neuromuscular disorder (HCC) 2017   medication treatment    Past Surgical History:  Procedure Laterality Date   ABDOMINAL HYSTERECTOMY  2013   total   BREAST BIOPSY Left 2013   benign   CESAREAN SECTION     COLONOSCOPY WITH PROPOFOL  N/A 02/09/2016   Procedure: COLONOSCOPY WITH PROPOFOL ;  Surgeon: Deward CINDERELLA Piedmont, MD;  Location: [facility] ENDOSCOPY;  Service: Gastroenterology;  Laterality: N/A;   TONSILLECTOMY AND ADENOIDECTOMY     TUBAL LIGATION  1995    MEDICATIONS:  Prior to Admission medications   Medication Sig Start Date End Date Taking? Authorizing Provider  acetaminophen  (TYLENOL ) 500 MG tablet Take 500 mg by mouth every 6 (six) hours as needed for moderate pain.    [provider]  aspirin  EC 81 MG tablet Take 1 tablet (81 mg total) by mouth daily. Swallow whole. 01/06/24   Antonette Angeline ORN, NP  benzonatate  (TESSALON ) 100 MG capsule Take 1-2 capsules (100-200 mg total) by mouth 3 (three) times daily as needed. 02/13/24   Vivienne Delon HERO, PA-C  Blood Glucose Monitoring Suppl DEVI 1 each by Does not apply  route in the morning, at noon, and at bedtime. May substitute to any manufacturer covered by patient's insurance. 03/11/24   Antonette Angeline ORN, NP  busPIRone  (BUSPAR ) 5 MG tablet TAKE 1 TABLET TWICE A DAY 07/30/24   Antonette Angeline ORN, NP  Cholecalciferol (VITAMIN D3) 50 MCG (2000 UT) capsule Take 2,000 Units by mouth daily.    [provider]  citalopram  (CELEXA ) 20 MG tablet TAKE 1 TABLET DAILY 07/30/24   Antonette Angeline ORN, NP  clonazePAM  (KLONOPIN ) 0.5 MG tablet TAKE ONE-HALF (1/2) TO ONE TABLET TWICE A DAY AS NEEDED FOR ANXIETY 08/02/24   Antonette Angeline ORN, NP  cyanocobalamin (VITAMIN B12) 1000 MCG tablet Take 1,000 mcg by mouth daily.    [provider]  gabapentin  (NEURONTIN ) 300 MG capsule TAKE 1 CAPSULE FOUR TIMES A DAY 07/30/24   Antonette Angeline ORN, NP  glipiZIDE  (GLUCOTROL ) 5 MG tablet TAKE 1 TABLET TWICE A DAY BEFORE MEALS 05/20/24   Antonette Angeline ORN, NP  LANTUS  SOLOSTAR 100 UNIT/ML Solostar Pen INJECT 35 UNITS UNDER THE SKIN TWICE A DAY 05/20/23   Antonette Angeline ORN, NP  lisinopril  (ZESTRIL ) 5 MG tablet TAKE 1 TABLET DAILY 07/30/24   Antonette Angeline ORN, NP  meclizine  (ANTIVERT ) 25 MG tablet TAKE 1 TABLET THREE TIMES A DAY AS NEEDED FOR DIZZINESS 01/05/24   Antonette Angeline ORN, NP  Multiple Vitamins-Minerals (CENTRUM SILVER 50+WOMEN PO) Take  1 tablet by mouth daily.    [provider]  Omega-3 Fatty Acids (FISH OIL) 1000 MG CAPS Take 1 capsule by mouth in the morning and at bedtime.    [provider]  omeprazole  (PRILOSEC) 20 MG capsule TAKE 1 CAPSULE DAILY 02/10/24   Antonette Angeline ORN, NP  Semaglutide, 1 MG/DOSE, (OZEMPIC, 1 MG/DOSE,) 4 MG/3ML SOPN Inject 1 mg into the skin once a week.    [provider]  simvastatin  (ZOCOR ) 20 MG tablet TAKE 1 TABLET DAILY AT 6 P.M. 05/06/24   Antonette Angeline ORN, NP    Physical Exam   Triage Vital Signs: ED Triage Vitals  Encounter Vitals Group     BP 08/24/24 0012 116/69     Girls Systolic BP Percentile --      Girls Diastolic BP Percentile  --      Boys Systolic BP Percentile --      Boys Diastolic BP Percentile --      Pulse Rate 08/24/24 0012 (!) 122     Resp 08/24/24 0012 18     Temp 08/24/24 0012 97.7 F (36.5 C)     Temp Source 08/24/24 0012 Oral     SpO2 08/24/24 0012 98 %     Weight 08/24/24 0012 155 lb (70.3 kg)     Height 08/24/24 0012 5' 3 (1.6 m)     Head Circumference --      Peak Flow --      Pain Score 08/24/24 0018 4     Pain Loc --      Pain Education --      Exclude from Growth Chart --     Most recent vital signs: Vitals:   08/24/24 0012 08/24/24 0017  BP: 116/69 116/69  Pulse: (!) 122 (!) 122  Resp: 18 18  Temp: 97.7 F (36.5 C) 97.7 F (36.5 C)  SpO2: 98% 98%    CONSTITUTIONAL: Alert, responds appropriately to questions.  Chronically ill-appearing HEAD: Normocephalic, atraumatic EYES: Conjunctivae clear, pupils appear equal, sclera nonicteric ENT: normal nose; moist mucous membranes NECK: Supple, normal ROM CARD: RRR; S1 and S2 appreciated RESP: Normal chest excursion without splinting or tachypnea; breath sounds clear and equal bilaterally; no wheezes, no rhonchi, no rales, no hypoxia or respiratory distress, speaking full sentences ABD/GI: Non-distended; soft, non-tender, no rebound, no guarding, no peritoneal signs BACK: The back appears normal EXT: Normal ROM in all joints; no deformity noted, no edema, no calf tenderness or calf swelling SKIN: Normal color for age and race; warm; no rash on exposed skin NEURO: Moves all extremities equally, normal speech PSYCH: The patient's mood and manner are appropriate.   ED Results / Procedures / Treatments   LABS: (all labs ordered are listed, but only abnormal results are displayed) Labs Reviewed  BASIC METABOLIC PANEL WITH GFR - Abnormal; Notable for the following components:      Result Value   Glucose, Bld 177 (*)    All other components within normal limits  CBC - Abnormal; Notable for the following components:   WBC 16.1 (*)     All other components within normal limits  TROPONIN I (HIGH SENSITIVITY) - Abnormal; Notable for the following components:   Troponin I (High Sensitivity) 47 (*)    All other components within normal limits  D-DIMER, QUANTITATIVE  APTT  PROTIME-INR  TROPONIN I (HIGH SENSITIVITY)     EKG:  EKG Interpretation Date/Time:  Tuesday August 24 2024 00:06:32 EDT Ventricular Rate:  69 PR  Interval:  218 QRS Duration:  88 QT Interval:  396 QTC Calculation: 424 R Axis:   -77  Text Interpretation: Sinus rhythm with 1st degree A-V block Left axis deviation Low voltage QRS Abnormal ECG When compared with ECG of 29-Oct-2022 20:55, PR interval has increased Confirmed by Neomi Neptune [phone]) on 08/24/2024 1:51:58 AM         RADIOLOGY: My personal review and interpretation of imaging: Chest x-ray clear.  I have personally reviewed all radiology reports.   DG Chest 2 View Result Date: 08/24/2024 EXAM: 2 VIEW(S) XRAY OF THE CHEST 08/24/2024 12:42:00 AM COMPARISON: None available. CLINICAL HISTORY: sob, cp. sharp chest pain that radiates to left shoulder, SOB, nausea, and weakness. Hx of diabetes, HTN, and current smoker. FINDINGS: LUNGS AND PLEURA: No focal pulmonary opacity. No pulmonary edema. No pleural effusion. No pneumothorax. HEART AND MEDIASTINUM: Atherosclerotic plaque. BONES AND SOFT TISSUES: No acute osseous abnormality. IMPRESSION: 1. No acute cardiopulmonary process. Electronically signed by: Morgane Naveau MD 08/24/2024 12:50 AM EDT RP Workstation: [id number]     PROCEDURES:  Critical Care performed: Yes, see critical care procedure note(s)   CRITICAL CARE Performed by: Neptune Neomi   Total critical care time: 30 minutes  Critical care time was exclusive of separately billable procedures and treating other patients.  Critical care was necessary to treat or prevent imminent or life-threatening deterioration.  Critical care was time spent personally by me on the  following activities: development of treatment plan with patient and/or surrogate as well as nursing, discussions with consultants, evaluation of patient's response to treatment, examination of patient, obtaining history from patient or surrogate, ordering and performing treatments and interventions, ordering and review of laboratory studies, ordering and review of radiographic studies, pulse oximetry and re-evaluation of patient's condition.   SABRA1-3 Lead EKG Interpretation  Performed by: Joyclyn Plazola, Neptune SAILOR, DO Authorized by: Shantee Hayne, Neptune SAILOR, DO     Interpretation: normal     ECG rate:  71   ECG rate assessment: normal     Rhythm: sinus rhythm     Ectopy: none     Conduction: normal       IMPRESSION / MDM / ASSESSMENT AND PLAN / ED COURSE  I reviewed the triage vital signs and the nursing notes.    Patient here chest pain, shortness of breath.  Multiple risk factors for ACS.  Initially tachycardic but heart rate now in the 70s.  The patient is on the cardiac monitor to evaluate for evidence of arrhythmia and/or significant heart rate changes.   DIFFERENTIAL DIAGNOSIS (includes but not limited to):   ACS, PE, CHF, SCAD, coronary vasospasm, esophagitis, GERD, less likely dissection, pneumonia, pneumothorax   Patient's presentation is most consistent with acute presentation with potential threat to life or bodily function.   PLAN: EKG shows first-degree AV block.  Normal creatinine.  Leukocytosis of 16,000 which appears chronic.  She denies any infectious symptoms.  First troponin elevated at 47.  Will give aspirin , nitroglycerin, heparin.  Second troponin pending.  Will also obtain D-dimer given patient had tachycardia on arrival.  Will discuss with hospitalist for admission.   MEDICATIONS GIVEN IN ED: Medications  nitroGLYCERIN (NITROSTAT) SL tablet 0.4 mg (has no administration in time range)  heparin ADULT infusion 100 units/mL (25000 units/250mL) (has no administration in time  range)  heparin bolus via infusion 4,000 Units (has no administration in time range)  aspirin  chewable tablet 324 mg (324 mg Oral Given 08/24/24 0232)     ED COURSE:  Second troponin flat.  D-dimer negative.  Will discuss with hospitalist for admission.   CONSULTS:  Consulted and discussed patient's case with hospitalist, Dr. Lawence.  I have recommended admission and consulting physician agrees and will place admission orders.  Patient (and family if present) agree with this plan.   I reviewed all nursing notes, vitals, pertinent previous records.  All labs, EKGs, imaging ordered have been independently reviewed and interpreted by myself.    OUTSIDE RECORDS REVIEWED: Reviewed recent internal medicine notes.       FINAL CLINICAL IMPRESSION(S) / ED DIAGNOSES   Final diagnoses:  Chest pain with high risk for cardiac etiology     Rx / DC Orders   ED Discharge Orders     None        Note:  This document was prepared using Dragon voice recognition software and may include unintentional dictation errors.   Kymberlyn Eckford, Josette SAILOR, DO 08/24/24 [phone]

## 2024-08-24 NOTE — ED Notes (Signed)
 Pt stated that they were still experiencing tightness in their left chest and was dizzy upon standing but the dizziness subsided and pt was able to ambulate with a stand by assist from staff to the hallway restroom due to the pt's IV pole needing to travel with the pt. Pt returned back to bed, was reconnected to VS monitoring equipment and bed was in the lowest, locked position with call bell in reach. Pt tolerated activity well.

## 2024-08-24 NOTE — Progress Notes (Signed)
 ANTICOAGULATION CONSULT NOTE  Pharmacy Consult for heparin infusion Indication: ACS/STEMI  Allergies  Allergen Reactions   Sulfa Antibiotics Hives    Patient Measurements: Height: 5' 3 (160 cm) Weight: 70.3 kg (155 lb) IBW/kg (Calculated) : 52.4 HEPARIN DW (KG): 66.9  Vital Signs: Temp: 97.8 F (36.6 C) (10/21 0851) Temp Source: Oral (10/21 0851) BP: 95/51 (10/21 1024) Pulse Rate: 64 (10/21 0930)  Labs: Recent Labs    08/24/24 0013 08/24/24 0241 08/24/24 1029  HGB 14.7  --   --   HCT 45.7  --   --   PLT 275  --   --   APTT  --  30  --   LABPROT  --  13.6  --   INR  --  1.0  --   HEPARINUNFRC  --   --  >1.10*  CREATININE 0.86  --   --   TROPONINIHS 47* 43*  --     Estimated Creatinine Clearance: 62.2 mL/min (by C-G formula based on SCr of 0.86 mg/dL).   Medical History: Past Medical History:  Diagnosis Date   Anxiety    Arthritis 2019   just found by x-ray   Cataract 2018   monitoring   Chicken pox    Depression    Diabetes mellitus without complication (HCC)    Frequent headaches    Hyperlipidemia    Hypertension 2018   fluctuates   Neuromuscular disorder (HCC) 2017   medication treatment    Assessment: Pt is a 63 yo female presenting to ED c/o sharp cp that radiates to left shoulder w/ sob, nausea, and weakness, found with elevated Troponin I level.  Goal of Therapy:  Heparin level 0.3-0.7 units/ml Monitor platelets by anticoagulation protocol: Yes   Date Time Results Comments 10/21 1029 HL >1.1 SUPRA-therapeutic, rate 850 units/hr  Plan:  HOLD infusion for 1 hrs, then resume heparin infusion at 750 units/hr Will check HL 6 hr after infusion resumed CBC daily while on heparin  Jaydalee Bardwell Rodriguez-Guzman PharmD, BCPS 08/24/2024 11:53 AM

## 2024-08-25 ENCOUNTER — Encounter: Payer: Self-pay | Admitting: Internal Medicine

## 2024-08-25 ENCOUNTER — Other Ambulatory Visit: Payer: Self-pay

## 2024-08-25 DIAGNOSIS — I249 Acute ischemic heart disease, unspecified: Secondary | ICD-10-CM

## 2024-08-25 DIAGNOSIS — F17200 Nicotine dependence, unspecified, uncomplicated: Secondary | ICD-10-CM

## 2024-08-25 DIAGNOSIS — R079 Chest pain, unspecified: Secondary | ICD-10-CM | POA: Diagnosis not present

## 2024-08-25 DIAGNOSIS — I200 Unstable angina: Secondary | ICD-10-CM | POA: Diagnosis not present

## 2024-08-25 DIAGNOSIS — E782 Mixed hyperlipidemia: Secondary | ICD-10-CM | POA: Diagnosis not present

## 2024-08-25 LAB — GLUCOSE, CAPILLARY
Glucose-Capillary: 148 mg/dL — ABNORMAL HIGH (ref 70–99)
Glucose-Capillary: 90 mg/dL (ref 70–99)

## 2024-08-25 MED ORDER — NITROGLYCERIN 0.4 MG SL SUBL
0.4000 mg | SUBLINGUAL_TABLET | SUBLINGUAL | 12 refills | Status: AC | PRN
  Filled 2024-08-25: qty 25, 8d supply, fill #0

## 2024-08-25 MED ORDER — ATORVASTATIN CALCIUM 40 MG PO TABS
40.0000 mg | ORAL_TABLET | Freq: Every day | ORAL | 0 refills | Status: DC
  Filled 2024-08-25: qty 90, 90d supply, fill #0

## 2024-08-25 MED ORDER — GABAPENTIN 300 MG PO CAPS
600.0000 mg | ORAL_CAPSULE | Freq: Every day | ORAL | 1 refills | Status: AC
  Filled 2024-08-25: qty 360, 180d supply, fill #0

## 2024-08-25 NOTE — Plan of Care (Signed)
  Problem: Education: Goal: Ability to describe self-care measures that may prevent or decrease complications (Diabetes Survival Skills Education) will improve Outcome: Progressing   Problem: Coping: Goal: Ability to adjust to condition or change in health will improve Outcome: Progressing   Problem: Fluid Volume: Goal: Ability to maintain a balanced intake and output will improve Outcome: Progressing   Problem: Health Behavior/Discharge Planning: Goal: Ability to identify and utilize available resources and services will improve Outcome: Progressing   Problem: Metabolic: Goal: Ability to maintain appropriate glucose levels will improve Outcome: Progressing   Problem: Skin Integrity: Goal: Risk for impaired skin integrity will decrease Outcome: Progressing   Problem: Clinical Measurements: Goal: Ability to maintain clinical measurements within normal limits will improve Outcome: Progressing Goal: Will remain free from infection Outcome: Progressing Goal: Respiratory complications will improve Outcome: Progressing Goal: Cardiovascular complication will be avoided Outcome: Progressing   Problem: Pain Managment: Goal: General experience of comfort will improve and/or be controlled Outcome: Progressing

## 2024-08-25 NOTE — Discharge Summary (Signed)
 Physician Discharge Summary   Patient: Brittany Pruitt MRN: [id number] DOB: 08/25/1961  Admit date:     08/24/2024  Discharge date: 08/25/24  Discharge Physician: Drue ONEIDA Potter   PCP: Antonette Angeline ORN, NP   Recommendations at discharge:  Follow-up with cardiology  Discharge Diagnoses:  Chest pain Cigarette smoking IIDM HTN HLD  Hospital Course:  Brittany Pruitt is a 63 y.o. female with medical history significant of GERD, IIDM, HTN, HLD, cigarette smoking, presented with new onset chest pain.   Symptoms started last night patient was sleeping, around 10 PM, patient started to have severe  tightness in the chest 8/10, radiating to left side up neck and shoulder, associated with shortness of breath, seems that there was no exacerbation or relieving factors.  Pain subsided after EMS arrived around midnight.  Patient leads a sedentary life, but she does have shortness of breath associated with even mild gardening work.   ED Course: Patient was found to be tachycardia heart rate in the 120s on arrival improved to 70s, blood pressure 109/58 O2 saturation 94% on room air.  Troponin trending 47> 43, BUN 23 creatinine 0.8 WBC 16.1 compared to baseline 12/14, hemoglobin 14.7.  Chest x-ray negative for acute findings EKG showed sinus rhythm no acute ST changes.   Patient was started on heparin drip in the ED. Was seen by cardiologist Patient underwent cardiac catheterization that did not show any coronary artery disease Have been cleared for discharge today will follow-up as an outpatient.  Consultants: Cardiology Procedures performed: As mentioned above Disposition: Home Diet recommendation:   DISCHARGE MEDICATION: Allergies as of 08/25/2024       Reactions   Sulfa Antibiotics Hives        Medication List     STOP taking these medications    benzonatate  100 MG capsule Commonly known as: TESSALON    lisinopril  5 MG tablet Commonly known as: ZESTRIL    simvastatin  20 MG  tablet Commonly known as: ZOCOR        TAKE these medications    acetaminophen  500 MG tablet Commonly known as: TYLENOL  Take 500 mg by mouth every 6 (six) hours as needed for moderate pain.   aspirin  EC 81 MG tablet Take 1 tablet (81 mg total) by mouth daily. Swallow whole.   atorvastatin 40 MG tablet Commonly known as: LIPITOR Take 1 tablet (40 mg total) by mouth daily.   Blood Glucose Monitoring Suppl Devi 1 each by Does not apply route in the morning, at noon, and at bedtime. May substitute to any manufacturer covered by patient's insurance.   busPIRone  5 MG tablet Commonly known as: BUSPAR  TAKE 1 TABLET TWICE A DAY   CENTRUM SILVER 50+WOMEN PO Take 1 tablet by mouth daily.   citalopram  20 MG tablet Commonly known as: CELEXA  TAKE 1 TABLET DAILY   clonazePAM  0.5 MG tablet Commonly known as: KLONOPIN  TAKE ONE-HALF (1/2) TO ONE TABLET TWICE A DAY AS NEEDED FOR ANXIETY   cyanocobalamin 1000 MCG tablet Commonly known as: VITAMIN B12 Take 1,000 mcg by mouth daily.   Fish Oil 1000 MG Caps Take 1 capsule by mouth in the morning and at bedtime.   gabapentin  300 MG capsule Commonly known as: NEURONTIN  Take 2 capsules (600 mg total) by mouth at bedtime. What changed: See the new instructions.   glipiZIDE  5 MG tablet Commonly known as: GLUCOTROL  TAKE 1 TABLET TWICE A DAY BEFORE MEALS   Lantus  SoloStar 100 UNIT/ML Solostar Pen Generic drug: insulin  glargine INJECT 35 UNITS UNDER THE  SKIN TWICE A DAY   meclizine  25 MG tablet Commonly known as: ANTIVERT  TAKE 1 TABLET THREE TIMES A DAY AS NEEDED FOR DIZZINESS   nitroGLYCERIN 0.4 MG SL tablet Commonly known as: NITROSTAT Place 1 tablet (0.4 mg total) under the tongue every 5 (five) minutes as needed for chest pain.   omeprazole  20 MG capsule Commonly known as: PRILOSEC TAKE 1 CAPSULE DAILY   Ozempic (1 MG/DOSE) 4 MG/3ML Sopn Generic drug: Semaglutide (1 MG/DOSE) Inject 1 mg into the skin once a week.    Vitamin D3 50 MCG (2000 UT) capsule Take 2,000 Units by mouth daily.        Follow-up Information     Perla Evalene PARAS, MD. Call.   Specialty: Cardiology Why: call Contact information: [address] [place] [address] [phone]                Discharge Exam: Brittany Pruitt   08/24/24 0012 08/24/24 1346  Weight: 70.3 kg 70.3 kg   Respiratory: clear to auscultation bilaterally, no wheezing, no crackles. Normal respiratory effort. No accessory muscle use.  Cardiovascular: Regular rate and rhythm, no murmurs / rubs / gallops. No extremity edema. 2+ pedal pulses. No carotid bruits.  Abdomen: no tenderness, no masses palpated. No hepatosplenomegaly. Bowel sounds positive.  Musculoskeletal: no clubbing / cyanosis. No joint deformity upper and lower extremities. Good ROM, no contractures. Normal muscle tone.  Skin: no rashes, lesions, ulcers. No induration Neurologic: CN 2-12 grossly intact. Sensation intact, DTR normal. Strength 5/5 in all 4.  Psychiatric: Normal judgment and insight. Alert and oriented x 3. Normal mood.   Condition at discharge: good  The results of significant diagnostics from this hospitalization (including imaging, microbiology, ancillary and laboratory) are listed below for reference.   Imaging Studies: CARDIAC CATHETERIZATION Result Date: 08/24/2024 Conclusions: Mild atherosclerotic plaquing of the coronary arteries without significant stenosis. 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 Low left ventricular filling pressure, which may have been accentuated by intracoronary nitroglycerin (LVEDP 3 mmHg). Recommendations: Challenge with metoprolol to tartrate 12.5 mg twice daily.  Obtain EKG in the morning to reassess heart rate and PR interval. Continue medical therapy and risk factor modification to prevent progression of mild atherosclerotic CAD. Recommend avoidance of nitrates, as these  can exacerbate the hemodynamic effects of myocardial bridging. If chest pain persists, consider evaluation for alternative etiologies of the patient's chest pain. Lonni Hanson, MD [facility]  ECHOCARDIOGRAM COMPLETE Result Date: 08/24/2024    ECHOCARDIOGRAM REPORT   Patient Name:   Brittany Pruitt Date of Exam: 08/24/2024 Medical Rec #:  [id number]        Height:       63.0 in Accession #:    [id number]       Weight:       155.0 lb Date of Birth:  [birth year]        BSA:          1.735 m Patient Age:    62 years         BP:           95/51 mmHg Patient Gender: F                HR:           69 bpm. Exam Location:  [facility] Procedure: 2D Echo, Color Doppler and Cardiac Doppler (Both Spectral and Color  Flow Doppler were utilized during procedure). Indications:     Chest Pain R07.9  History:         Patient has no prior history of Echocardiogram examinations.                  Signs/Symptoms:Chest Pain.  Sonographer:     Rosina Dunk Referring Phys:  [id number] CORT ONEIDA MANA Diagnosing Phys: Lonni Hanson MD IMPRESSIONS  1. Left ventricular ejection fraction, by estimation, is 60 to 65%. The left ventricle has normal function. The left ventricle has no regional wall motion abnormalities. Left ventricular diastolic parameters were normal.  2. Right ventricular systolic function is normal. The right ventricular size is mildly enlarged. Tricuspid regurgitation signal is inadequate for assessing PA pressure.  3. The mitral valve is normal in structure. No evidence of mitral valve regurgitation. No evidence of mitral stenosis.  4. The aortic valve is tricuspid. Aortic valve regurgitation is not visualized. No aortic stenosis is present.  5. Aortic dilatation noted. There is borderline dilatation of the aortic root, measuring 38 mm.  6. The inferior vena cava is normal in size with greater than 50% respiratory variability, suggesting right atrial pressure of 3 mmHg. FINDINGS  Left Ventricle: Left  ventricular ejection fraction, by estimation, is 60 to 65%. The left ventricle has normal function. The left ventricle has no regional wall motion abnormalities. The left ventricular internal cavity size was normal in size. There is  no left ventricular hypertrophy. Left ventricular diastolic parameters were normal. Right Ventricle: The right ventricular size is mildly enlarged. No increase in right ventricular wall thickness. Right ventricular systolic function is normal. Tricuspid regurgitation signal is inadequate for assessing PA pressure. Left Atrium: Left atrial size was normal in size. Right Atrium: Right atrial size was normal in size. Pericardium: There is no evidence of pericardial effusion. Mitral Valve: The mitral valve is normal in structure. No evidence of mitral valve regurgitation. No evidence of mitral valve stenosis. MV peak gradient, 2.7 mmHg. The mean mitral valve gradient is 1.0 mmHg. Tricuspid Valve: The tricuspid valve is normal in structure. Tricuspid valve regurgitation is trivial. Aortic Valve: The aortic valve is tricuspid. Aortic valve regurgitation is not visualized. No aortic stenosis is present. Aortic valve mean gradient measures 2.0 mmHg. Aortic valve peak gradient measures 3.2 mmHg. Aortic valve area, by VTI measures 1.73 cm. Pulmonic Valve: The pulmonic valve was not well visualized. Pulmonic valve regurgitation is not visualized. No evidence of pulmonic stenosis. Aorta: Aortic dilatation noted. There is borderline dilatation of the aortic root, measuring 38 mm. Pulmonary Artery: The pulmonary artery is not well seen. Venous: The inferior vena cava is normal in size with greater than 50% respiratory variability, suggesting right atrial pressure of 3 mmHg. IAS/Shunts: No atrial level shunt detected by color flow Doppler.  LEFT VENTRICLE PLAX 2D LVIDd:         3.70 cm     Diastology LVIDs:         2.10 cm     LV e' medial:    8.27 cm/s LV PW:         0.80 cm     LV E/e' medial:  8.8  LV IVS:        0.80 cm     LV e' lateral:   10.40 cm/s LVOT diam:     1.60 cm     LV E/e' lateral: 7.0 LV SV:         34 LV SV Index:  19 LVOT Area:     2.01 cm  LV Volumes (MOD) LV vol d, MOD A2C: 37.3 ml LV vol d, MOD A4C: 83.3 ml LV vol s, MOD A2C: 11.6 ml LV vol s, MOD A4C: 20.3 ml LV SV MOD A2C:     25.7 ml LV SV MOD A4C:     83.3 ml LV SV MOD BP:      43.9 ml RIGHT VENTRICLE             IVC RV Basal diam:  4.25 cm     IVC diam: 1.30 cm RV Mid diam:    4.10 cm RV S prime:     12.30 cm/s TAPSE (M-mode): 2.4 cm LEFT ATRIUM             Index        RIGHT ATRIUM           Index LA diam:        3.60 cm 2.07 cm/m   RA Area:     15.20 cm LA Vol (A2C):   18.3 ml 10.55 ml/m  RA Volume:   34.60 ml  19.94 ml/m LA Vol (A4C):   33.9 ml 19.54 ml/m LA Biplane Vol: 27.8 ml 16.02 ml/m  AORTIC VALVE                    PULMONIC VALVE AV Area (Vmax):    1.86 cm     PV Vmax:        0.92 m/s AV Area (Vmean):   1.80 cm     PV Vmean:       64.000 cm/s AV Area (VTI):     1.73 cm     PV VTI:         0.195 m AV Vmax:           89.60 cm/s   PV Peak grad:   3.4 mmHg AV Vmean:          61.800 cm/s  PV Mean grad:   2.0 mmHg AV VTI:            0.195 m      RVOT Peak grad: 2 mmHg AV Peak Grad:      3.2 mmHg AV Mean Grad:      2.0 mmHg LVOT Vmax:         83.10 cm/s LVOT Vmean:        55.200 cm/s LVOT VTI:          0.168 m LVOT/AV VTI ratio: 0.86  AORTA Ao Root diam: 3.80 cm MITRAL VALVE MV Area (PHT): 3.48 cm    SHUNTS MV Area VTI:   1.44 cm    Systemic VTI:  0.17 m MV Peak grad:  2.7 mmHg    Systemic Diam: 1.60 cm MV Mean grad:  1.0 mmHg    Pulmonic VTI:  0.143 m MV Vmax:       0.82 m/s MV Vmean:      56.5 cm/s MV Decel Time: 218 msec MV E velocity: 72.40 cm/s MV A velocity: 72.80 cm/s MV E/A ratio:  0.99 Lonni End MD Electronically signed by Lonni Hanson MD Signature Date/Time: 08/24/2024/5:31:56 PM    Final    DG Chest 2 View Result Date: 08/24/2024 EXAM: 2 VIEW(S) XRAY OF THE CHEST 08/24/2024 12:42:00 AM  COMPARISON: None available. CLINICAL HISTORY: sob, cp. sharp chest pain that radiates to left shoulder, SOB, nausea, and weakness. Hx of diabetes, HTN, and current smoker.  FINDINGS: LUNGS AND PLEURA: No focal pulmonary opacity. No pulmonary edema. No pleural effusion. No pneumothorax. HEART AND MEDIASTINUM: Atherosclerotic plaque. BONES AND SOFT TISSUES: No acute osseous abnormality. IMPRESSION: 1. No acute cardiopulmonary process. Electronically signed by: Morgane Naveau MD 08/24/2024 12:50 AM EDT RP Workstation: [id number]    Microbiology: Results for orders placed or performed during the hospital encounter of 10/29/22  Blood Culture (routine x 2)     Status: None   Collection Time: 10/29/22  9:05 PM   Specimen: Right Antecubital; Blood  Result Value Ref Range Status   Specimen Description RIGHT ANTECUBITAL  Final   Special Requests   Final    BOTTLES DRAWN AEROBIC AND ANAEROBIC Blood Culture adequate volume   Culture   Final    NO GROWTH 5 DAYS Performed at [facility], [address] [place], [place] [address]    Report Status 11/03/2022 FINAL  Final  Resp panel by RT-PCR (RSV, Flu A&B, Covid) Anterior Nasal Swab     Status: None   Collection Time: 10/29/22  9:15 PM   Specimen: Anterior Nasal Swab  Result Value Ref Range Status   SARS Coronavirus 2 by RT PCR NEGATIVE NEGATIVE Final    Comment: (NOTE) SARS-CoV-2 target nucleic acids are NOT DETECTED.  The SARS-CoV-2 RNA is generally detectable in upper respiratory specimens during the acute phase of infection. The lowest concentration of SARS-CoV-2 viral copies this assay can detect is 138 copies/mL. A negative result does not preclude SARS-Cov-2 infection and should not be used as the sole basis for treatment or other patient management decisions. A negative result may occur with  improper specimen collection/handling, submission of specimen other than nasopharyngeal swab, presence of viral mutation(s) within  the areas targeted by this assay, and inadequate number of viral copies(<138 copies/mL). A negative result must be combined with clinical observations, patient history, and epidemiological information. The expected result is Negative.  Fact Sheet for Patients:  BloggerCourse.com  Fact Sheet for Healthcare Providers:  SeriousBroker.it  This test is no t yet approved or cleared by the [place]  FDA and  has been authorized for detection and/or diagnosis of SARS-CoV-2 by FDA under an Emergency Use Authorization (EUA). This EUA will remain  in effect (meaning this test can be used) for the duration of the COVID-19 declaration under Section 564(b)(1) of the Act, 21 U.S.C.section 360bbb-3(b)(1), unless the authorization is terminated  or revoked sooner.       Influenza A by PCR NEGATIVE NEGATIVE Final   Influenza B by PCR NEGATIVE NEGATIVE Final    Comment: (NOTE) The Xpert Xpress SARS-CoV-2/FLU/RSV plus assay is intended as an aid in the diagnosis of influenza from Nasopharyngeal swab specimens and should not be used as a sole basis for treatment. Nasal washings and aspirates are unacceptable for Xpert Xpress SARS-CoV-2/FLU/RSV testing.  Fact Sheet for Patients: BloggerCourse.com  Fact Sheet for Healthcare Providers: SeriousBroker.it  This test is not yet approved or cleared by the [place]  FDA and has been authorized for detection and/or diagnosis of SARS-CoV-2 by FDA under an Emergency Use Authorization (EUA). This EUA will remain in effect (meaning this test can be used) for the duration of the COVID-19 declaration under Section 564(b)(1) of the Act, 21 U.S.C. section 360bbb-3(b)(1), unless the authorization is terminated or revoked.     Resp Syncytial Virus by PCR NEGATIVE NEGATIVE Final    Comment: (NOTE) Fact Sheet for  Patients: BloggerCourse.com  Fact Sheet for Healthcare Providers: SeriousBroker.it  This test is not  yet approved or cleared by the [place]  FDA and has been authorized for detection and/or diagnosis of SARS-CoV-2 by FDA under an Emergency Use Authorization (EUA). This EUA will remain in effect (meaning this test can be used) for the duration of the COVID-19 declaration under Section 564(b)(1) of the Act, 21 U.S.C. section 360bbb-3(b)(1), unless the authorization is terminated or revoked.  Performed at [facility], [address] [place], [place] [address]     Labs: CBC: Recent Labs  Lab 08/24/24 0013  WBC 16.1*  HGB 14.7  HCT 45.7  MCV 91.0  PLT 275   Basic Metabolic Panel: Recent Labs  Lab 08/24/24 0013  NA 138  K 3.8  CL 103  CO2 22  GLUCOSE 177*  BUN 22  CREATININE 0.86  CALCIUM 9.5   Liver Function Tests: No results for input(s): AST, ALT, ALKPHOS, BILITOT, PROT, ALBUMIN in the last 168 hours. CBG: Recent Labs  Lab 08/24/24 1155 08/24/24 1356 08/24/24 2136 08/25/24 0751 08/25/24 1211  GLUCAP 108* 114* 179* 148* 90    Discharge time spent:  35 minutes.  Signed: Drue ONEIDA Potter, MD [facility] 08/25/2024

## 2024-08-25 NOTE — Progress Notes (Signed)
 Rounding Note   Patient Name: Brittany Pruitt Date of Encounter: 08/25/2024  [facility] Cardiologist: Medford Hanson, [facility]  Subjective Records reviewed, presenting with atypical chest pain Troponin 40, nontrending EKG nonacute Given risk factors was taken to the catheterization lab by interventional cardiology, no obstructive disease noted  This morning without any symptoms, feels well, ambulating Long discussion concerning smoking Blood pressure low, metoprolol and lisinopril  held Simvastatin  changed to atorvastatin  Scheduled Meds:  aspirin  EC  81 mg Oral Daily   atorvastatin  40 mg Oral Daily   busPIRone   5 mg Oral BID   citalopram   20 mg Oral Daily   enoxaparin  (LOVENOX ) injection  40 mg Subcutaneous Q24H   gabapentin   300 mg Oral QHS   insulin  aspart  0-15 Units Subcutaneous TID WC   insulin  glargine-yfgn  20 Units Subcutaneous Daily   nicotine   21 mg Transdermal Daily   pantoprazole  40 mg Oral Daily   sodium chloride  flush  3 mL Intravenous Q12H   Continuous Infusions:  sodium chloride      PRN Meds: sodium chloride , acetaminophen , clonazePAM , meclizine , nitroGLYCERIN, ondansetron  (ZOFRAN ) IV, sodium chloride  flush   Vital Signs  Vitals:   08/25/24 0514 08/25/24 0527 08/25/24 0749 08/25/24 0934  BP: (!) 100/51  (!) 98/53 (!) 98/53  Pulse: (!) 57  (!) 58 66  Resp: (!) 21 20 16    Temp: 97.9 F (36.6 C)  97.7 F (36.5 C)   TempSrc:   Oral   SpO2: 94%  100% 100%  Weight:      Height:        Intake/Output Summary (Last 24 hours) at 08/25/2024 1127 Last data filed at 08/25/2024 1012 Gross per 24 hour  Intake 1013 ml  Output --  Net 1013 ml      08/24/2024    1:46 PM 08/24/2024   12:12 AM 07/08/2024   10:43 AM  Last 3 Weights  Weight (lbs) 155 lb 155 lb 155 lb 6.4 oz  Weight (kg) 70.308 kg 70.308 kg 70.489 kg      Telemetry Normal sinus rhythm- Personally Reviewed  ECG   - Personally Reviewed  Physical Exam  GEN: No acute  distress.   Neck: No JVD Cardiac: RRR, no murmurs, rubs, or gallops.  Respiratory: Clear to auscultation bilaterally. GI: Soft, nontender, non-distended  MS: No edema; No deformity. Neuro:  Nonfocal  Psych: Normal affect   Labs High Sensitivity Troponin:   Recent Labs  Lab 08/24/24 0013 08/24/24 0241  TROPONINIHS 47* 43*     Chemistry Recent Labs  Lab 08/24/24 0013  NA 138  K 3.8  CL 103  CO2 22  GLUCOSE 177*  BUN 22  CREATININE 0.86  CALCIUM 9.5  GFRNONAA >60  ANIONGAP 13    Lipids No results for input(s): CHOL, TRIG, HDL, LABVLDL, LDLCALC, CHOLHDL in the last 168 hours.  Hematology Recent Labs  Lab 08/24/24 0013  WBC 16.1*  RBC 5.02  HGB 14.7  HCT 45.7  MCV 91.0  MCH 29.3  MCHC 32.2  RDW 14.6  PLT 275   Thyroid No results for input(s): TSH, FREET4 in the last 168 hours.  BNPNo results for input(s): BNP, PROBNP in the last 168 hours.  DDimer  Recent Labs  Lab 08/24/24 0241  DDIMER 0.37     Radiology  CARDIAC CATHETERIZATION Result Date: 08/24/2024 Conclusions: Mild atherosclerotic plaquing of the coronary arteries without significant stenosis. 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 Low left ventricular filling pressure, which may have been accentuated by intracoronary nitroglycerin (LVEDP 3 mmHg). Recommendations: Challenge with metoprolol to tartrate 12.5 mg twice daily.  Obtain EKG in the morning to reassess heart rate and PR interval. Continue medical therapy and risk factor modification to prevent progression of mild atherosclerotic CAD. Recommend avoidance of nitrates, as these can exacerbate the hemodynamic effects of myocardial bridging. If chest pain persists, consider evaluation for alternative etiologies of the patient's chest pain. Lonni Hanson, MD [facility]  ECHOCARDIOGRAM COMPLETE Result Date: 08/24/2024    ECHOCARDIOGRAM REPORT   Patient Name:   Brittany Pruitt  Date of Exam: 08/24/2024 Medical Rec #:  [id number]        Height:       63.0 in Accession #:    [id number]       Weight:       155.0 lb Date of Birth:  [birth year]        BSA:          1.735 m Patient Age:    62 years         BP:           95/51 mmHg Patient Gender: F                HR:           69 bpm. Exam Location:  [facility] Procedure: 2D Echo, Color Doppler and Cardiac Doppler (Both Spectral and Color            Flow Doppler were utilized during procedure). Indications:     Chest Pain R07.9  History:         Patient has no prior history of Echocardiogram examinations.                  Signs/Symptoms:Chest Pain.  Sonographer:     Rosina Dunk Referring Phys:  [id number] CORT ONEIDA MANA Diagnosing Phys: Lonni Hanson MD IMPRESSIONS  1. Left ventricular ejection fraction, by estimation, is 60 to 65%. The left ventricle has normal function. The left ventricle has no regional wall motion abnormalities. Left ventricular diastolic parameters were normal.  2. Right ventricular systolic function is normal. The right ventricular size is mildly enlarged. Tricuspid regurgitation signal is inadequate for assessing PA pressure.  3. The mitral valve is normal in structure. No evidence of mitral valve regurgitation. No evidence of mitral stenosis.  4. The aortic valve is tricuspid. Aortic valve regurgitation is not visualized. No aortic stenosis is present.  5. Aortic dilatation noted. There is borderline dilatation of the aortic root, measuring 38 mm.  6. The inferior vena cava is normal in size with greater than 50% respiratory variability, suggesting right atrial pressure of 3 mmHg. FINDINGS  Left Ventricle: Left ventricular ejection fraction, by estimation, is 60 to 65%. The left ventricle has normal function. The left ventricle has no regional wall motion abnormalities. The left ventricular internal cavity size was normal in size. There is  no left ventricular hypertrophy. Left ventricular diastolic parameters were  normal. Right Ventricle: The right ventricular size is mildly enlarged. No increase in right ventricular wall thickness. Right ventricular systolic function is normal. Tricuspid regurgitation signal is inadequate for assessing PA pressure. Left Atrium: Left atrial size was normal in size. Right Atrium: Right atrial size was normal in size. Pericardium: There is no evidence of pericardial effusion. Mitral Valve: The mitral valve is normal in structure. No evidence of mitral valve regurgitation. No evidence of mitral valve stenosis.  MV peak gradient, 2.7 mmHg. The mean mitral valve gradient is 1.0 mmHg. Tricuspid Valve: The tricuspid valve is normal in structure. Tricuspid valve regurgitation is trivial. Aortic Valve: The aortic valve is tricuspid. Aortic valve regurgitation is not visualized. No aortic stenosis is present. Aortic valve mean gradient measures 2.0 mmHg. Aortic valve peak gradient measures 3.2 mmHg. Aortic valve area, by VTI measures 1.73 cm. Pulmonic Valve: The pulmonic valve was not well visualized. Pulmonic valve regurgitation is not visualized. No evidence of pulmonic stenosis. Aorta: Aortic dilatation noted. There is borderline dilatation of the aortic root, measuring 38 mm. Pulmonary Artery: The pulmonary artery is not well seen. Venous: The inferior vena cava is normal in size with greater than 50% respiratory variability, suggesting right atrial pressure of 3 mmHg. IAS/Shunts: No atrial level shunt detected by color flow Doppler.  LEFT VENTRICLE PLAX 2D LVIDd:         3.70 cm     Diastology LVIDs:         2.10 cm     LV e' medial:    8.27 cm/s LV PW:         0.80 cm     LV E/e' medial:  8.8 LV IVS:        0.80 cm     LV e' lateral:   10.40 cm/s LVOT diam:     1.60 cm     LV E/e' lateral: 7.0 LV SV:         34 LV SV Index:   19 LVOT Area:     2.01 cm  LV Volumes (MOD) LV vol d, MOD A2C: 37.3 ml LV vol d, MOD A4C: 83.3 ml LV vol s, MOD A2C: 11.6 ml LV vol s, MOD A4C: 20.3 ml LV SV MOD A2C:      25.7 ml LV SV MOD A4C:     83.3 ml LV SV MOD BP:      43.9 ml RIGHT VENTRICLE             IVC RV Basal diam:  4.25 cm     IVC diam: 1.30 cm RV Mid diam:    4.10 cm RV S prime:     12.30 cm/s TAPSE (M-mode): 2.4 cm LEFT ATRIUM             Index        RIGHT ATRIUM           Index LA diam:        3.60 cm 2.07 cm/m   RA Area:     15.20 cm LA Vol (A2C):   18.3 ml 10.55 ml/m  RA Volume:   34.60 ml  19.94 ml/m LA Vol (A4C):   33.9 ml 19.54 ml/m LA Biplane Vol: 27.8 ml 16.02 ml/m  AORTIC VALVE                    PULMONIC VALVE AV Area (Vmax):    1.86 cm     PV Vmax:        0.92 m/s AV Area (Vmean):   1.80 cm     PV Vmean:       64.000 cm/s AV Area (VTI):     1.73 cm     PV VTI:         0.195 m AV Vmax:           89.60 cm/s   PV Peak grad:   3.4 mmHg AV Vmean:  61.800 cm/s  PV Mean grad:   2.0 mmHg AV VTI:            0.195 m      RVOT Peak grad: 2 mmHg AV Peak Grad:      3.2 mmHg AV Mean Grad:      2.0 mmHg LVOT Vmax:         83.10 cm/s LVOT Vmean:        55.200 cm/s LVOT VTI:          0.168 m LVOT/AV VTI ratio: 0.86  AORTA Ao Root diam: 3.80 cm MITRAL VALVE MV Area (PHT): 3.48 cm    SHUNTS MV Area VTI:   1.44 cm    Systemic VTI:  0.17 m MV Peak grad:  2.7 mmHg    Systemic Diam: 1.60 cm MV Mean grad:  1.0 mmHg    Pulmonic VTI:  0.143 m MV Vmax:       0.82 m/s MV Vmean:      56.5 cm/s MV Decel Time: 218 msec MV E velocity: 72.40 cm/s MV A velocity: 72.80 cm/s MV E/A ratio:  0.99 Lonni End MD Electronically signed by Lonni Hanson MD Signature Date/Time: 08/24/2024/5:31:56 PM    Final    DG Chest 2 View Result Date: 08/24/2024 EXAM: 2 VIEW(S) XRAY OF THE CHEST 08/24/2024 12:42:00 AM COMPARISON: None available. CLINICAL HISTORY: sob, cp. sharp chest pain that radiates to left shoulder, SOB, nausea, and weakness. Hx of diabetes, HTN, and current smoker. FINDINGS: LUNGS AND PLEURA: No focal pulmonary opacity. No pulmonary edema. No pleural effusion. No pneumothorax. HEART AND MEDIASTINUM:  Atherosclerotic plaque. BONES AND SOFT TISSUES: No acute osseous abnormality. IMPRESSION: 1. No acute cardiopulmonary process. Electronically signed by: Morgane Naveau MD 08/24/2024 12:50 AM EDT RP Workstation: [id number]    Cardiac Studies   Patient Profile   Brittany Pruitt is a 63 y.o. female with a hx of GERD, type 2 diabetes, hypertension, hyperlipidemia, and going tobacco use who is being seen 08/24/2024 for the evaluation of chest pain   Assessment & Plan  Chest pain Troponin 40s, nontrending Was treated with heparin infusion Echo with normal ejection fraction EKG nonacute Given risk factors of diabetes, hyperlipidemia, smoker, taken to the catheterization lab showing no disease -No further cardiac workup needed at this time - Continue Lipitor - Lisinopril , metoprolol on hold given low blood pressure  Smoker Cessation recommended, recommended Chantix, she reports it is just a bad habit     For questions or updates, please contact [facility] Please consult [email or web address] for contact info under    Signed, Birdie Beveridge, MD  08/25/2024, 11:27 AM

## 2024-08-25 NOTE — Progress Notes (Signed)
Discharge instructions reviewed with patient utilizing teach back method. Patient discharged to home  

## 2024-08-25 NOTE — Care Management Obs Status (Signed)
 MEDICARE OBSERVATION STATUS NOTIFICATION   Patient Details  Name: Brittany Pruitt MRN: [id number] Date of Birth: April 18, 1961   Medicare Observation Status Notification Given:  Yes    Rojelio [name] 08/25/2024, 10:32 AM

## 2024-08-26 LAB — LIPOPROTEIN A (LPA): Lipoprotein (a): <8.4 nmol/L (ref <75.0)

## 2024-09-06 ENCOUNTER — Other Ambulatory Visit

## 2024-09-08 ENCOUNTER — Telehealth: Payer: Self-pay

## 2024-09-08 ENCOUNTER — Other Ambulatory Visit: Admitting: Pharmacist

## 2024-09-08 ENCOUNTER — Other Ambulatory Visit (HOSPITAL_COMMUNITY): Payer: Self-pay

## 2024-09-08 DIAGNOSIS — Z794 Long term (current) use of insulin: Secondary | ICD-10-CM

## 2024-09-08 DIAGNOSIS — Z7985 Long-term (current) use of injectable non-insulin antidiabetic drugs: Secondary | ICD-10-CM

## 2024-09-08 NOTE — Telephone Encounter (Signed)
 PAP: Patient assistance application for Basaglar  and Trulicity through [employer] has been mailed to pt's home address on file. Provider portion of application will be faxed to provider's office. With exemption form requested.

## 2024-09-08 NOTE — Patient Instructions (Signed)
 Goals Addressed             This Visit's Progress    Pharmacy Goals       Please watch the mail for an envelope from [facility] containing the patient assistance program application. Please complete this application and bring to office to have it faxed back to Attention: Suzen Mall at Fax # [phone] along with a copy of your Medicare Part D prescription card and a copy of your proof of income document.  If you need to call Suzen, you can reach her at [phone].  Our goal A1c is less than 7%. This corresponds with fasting sugars less than 130 and 2 hour after meal sugars less than 180. Please keep a log of your results when checking your blood sugar   Our goal bad cholesterol, or LDL, is less than 70 . This is why it is important to continue taking your simvastatin .  Please have your medications and blood sugar log with you for our next telephone call.   Sharyle Sia, PharmD, [facility] Clinical Pharmacist [facility] [phone]

## 2024-09-08 NOTE — Progress Notes (Signed)
 09/08/2024 Name: Brittany Pruitt MRN: [id number] DOB: 06/04/1961  Chief Complaint  Patient presents with   Medication Management   Medication Assistance    Brittany Pruitt is a 63 y.o. year old female who presented for a telephone visit.   They were referred to the pharmacist by their PCP for assistance in managing diabetes and medication access.   From review of chart, note patient admitted to [facility] on 08/24/2024 related to chest pain. She underwent cardiac catheterization and was discharged 10/22.     Subjective:   Care Team: Primary Care Provider: Antonette Angeline ORN, NP ; Next Scheduled Visit: 01/06/2025   Medication Access/Adherence  Current Pharmacy:  [facility] - [place], [place] - [address] [place] [address] Phone: [phone] Fax: [phone]  [id number] [place], [place] - [address] AT [facility] OF [address] [place] [address] Phone: [phone] Fax: [phone]  [facility] [address] [place] [address] Phone: [phone] Fax: [phone]   Patient reports affordability concerns with their medications: No  Patient reports access/transportation concerns to their pharmacy: No  Patient reports adherence concerns with their medications:  No   Today patient shares that she is planning to re-enroll in [facility] Essential Plus plan for 2026   Diabetes:   Current medications:  - insulin  glargine (Lantus ) 35 units twice daily - Ozempic 1 mg weekly (reports tolerating well             Reports tolerating well - glipizide  5 mg twice daily before meals   Medications tried in the past: metformin  (GI side effects)   Recalls current glucose readings ranging: morning fasting: 95-109   Denies recent symptoms of hypoglycemia - Carries glucose tablets with her in case needed for low blood sugar    Current physical activity: reports active walking and gardening most days/week  Statin: atorvastatin 40 mg daily   Current medication access support: enrolled in patient assistance program for Ozempic from [employer] through 11/03/2024 [employer] has announced that they will no longer offer Ozempic to [employer] beneficiaries through their Patient Assistance Program in 2026.   Currently has 1 month supply remaining  Tobacco Abuse:  Reports has cut back on smoking since hospital discharge; currently smoking 1/2 pack/day  Previously tried: nicotine  patches (skin irritation), Chantix (restarted smoking whenever stopped)  Denies interest in setting quit date at this time; enjoys smoking   Objective:  Lab Results  Component Value Date   HGBA1C 6.0 (H) 07/08/2024    Lab Results  Component Value Date   CREATININE 0.86 08/24/2024   BUN 22 08/24/2024   NA 138 08/24/2024   K 3.8 08/24/2024   CL 103 08/24/2024   CO2 22 08/24/2024    Lab Results  Component Value Date   CHOL 177 07/08/2024   HDL 42 (L) 07/08/2024   LDLCALC 110 (H) 07/08/2024   TRIG 141 07/08/2024   CHOLHDL 4.2 07/08/2024   BP Readings from Last 3 Encounters:  08/25/24 (!) 89/58  07/08/24 110/68  01/06/24 118/68   Pulse Readings from Last 3 Encounters:  08/25/24 62  07/04/23 61  12/13/22 80     Medications Reviewed Today     Reviewed by Alana Sharyle LABOR, RPH-CPP (Pharmacist) on 09/08/24 at 1445  Med List Status: <None>   Medication Order Taking? Sig Documenting Provider Last Dose Status Informant  acetaminophen  (TYLENOL ) 500 MG tablet [id number]  Take 500 mg by mouth every 6 (six) hours as needed for moderate pain. [provider]  Active Self  aspirin  EC 81 MG tablet [id number]  Take 1 tablet (81 mg total) by mouth daily. Swallow whole. Antonette Angeline ORN, NP  Active Self  atorvastatin (LIPITOR) 40 MG tablet [id number] Yes Take 1 tablet (40 mg total) by mouth daily. Dorinda Drue DASEN, MD  Active    Blood Glucose Monitoring Suppl DEVI [id number]  1 each by Does not apply route in the morning, at noon, and at bedtime. May substitute to any manufacturer covered by patient's insurance. Antonette Angeline ORN, NP  Active Self  busPIRone  (BUSPAR ) 5 MG tablet [id number]  TAKE 1 TABLET TWICE A DAY [name], Angeline ORN, NP  Active Self  Cholecalciferol (VITAMIN D3) 50 MCG (2000 UT) capsule [id number]  Take 2,000 Units by mouth daily. [provider]  Active Self  citalopram  (CELEXA ) 20 MG tablet [id number]  TAKE 1 TABLET DAILY Antonette Angeline ORN, NP  Active Self  clonazePAM  (KLONOPIN ) 0.5 MG tablet [id number]  TAKE ONE-HALF (1/2) TO ONE TABLET TWICE A DAY AS NEEDED FOR ANXIETY Antonette Angeline ORN, NP  Active Self  cyanocobalamin (VITAMIN B12) 1000 MCG tablet [id number]  Take 1,000 mcg by mouth daily. [provider]  Active Self  gabapentin  (NEURONTIN ) 300 MG capsule [id number]  Take 2 capsules (600 mg total) by mouth at bedtime. Dorinda Drue DASEN, MD  Active   glipiZIDE  (GLUCOTROL ) 5 MG tablet [id number] Yes TAKE 1 TABLET TWICE A DAY BEFORE MEALS Antonette Angeline ORN, NP  Active Self  LANTUS  SOLOSTAR 100 UNIT/ML Solostar Pen [id number] Yes INJECT 35 UNITS UNDER THE SKIN TWICE A DAY Antonette Angeline ORN, NP  Active Self  meclizine  (ANTIVERT ) 25 MG tablet [id number]  TAKE 1 TABLET THREE TIMES A DAY AS NEEDED FOR DIZZINESS Antonette Angeline ORN, NP  Active Self  Multiple Vitamins-Minerals (CENTRUM SILVER 50+WOMEN PO) [id number]  Take 1 tablet by mouth daily. [provider]  Active Self  nitroGLYCERIN (NITROSTAT) 0.4 MG SL tablet [id number]  Place 1 tablet (0.4 mg total) under the tongue every 5 (five) minutes as needed for chest pain. Dorinda Drue DASEN, MD  Active   Omega-3 Fatty Acids (FISH OIL) 1000 MG CAPS [id number]  Take 1 capsule by mouth in the morning and at bedtime. [provider]  Active Self  omeprazole  (PRILOSEC) 20 MG capsule [id number]  TAKE 1 CAPSULE DAILY [name], Angeline ORN, NP  Active Self  Semaglutide, 1  MG/DOSE, (OZEMPIC, 1 MG/DOSE,) 4 MG/3ML SOPN [id number] Yes Inject 1 mg into the skin once a week. [provider]  Active Self              Assessment/Plan:   Per [employer] Medicare website, with [facility] Essential Plus plan for 2026, patient will have a $615 annual prescription deductible (impacting tiers 3-5) and tier 3 copayment of 25% of drug cost in 2026. Note Medicare prescription plans have a $2,100 max out of pocket cost in 2026. - Based on this information, patient advises that she will be unable to continue to take Ozempic next year due to cost - Collaborate with patient and PCP regarding diabetes medication management options for patient. Will support patient with applying for patient assistance for Trulicity from [employer]. Note Medical Exception Request form required to be completed by PCP for this application.  Encourage patient to call to schedule follow up with PCP and Cardiology as directed at hospital  discharge  Diabetes: - Reviewed goal A1c, goal fasting, and goal 2 hour post prandial glucose - Reviewed dietary modifications including Encourage patient to have regular well-balanced meals and snacks spread throughout the day, while controlling carbohydrate portion sizes             Advise patient against skipping meals - Counsel patient to continue to monitor home blood sugar, keep log of results, have this record to review during upcoming appointments and to contact office sooner if needed for reading outside of established parameters or for symptoms   - Will collaborate with PCP and CPhT to aid patient with applying for patient assistance for both Basaglar  insulin  and Trulicity from [employer]    Follow Up Plan: Clinical Pharmacist will follow up with patient by telephone on 11/01/2024 at 9:00 AM    Sharyle Sia, PharmD, [facility] Clinical Pharmacist [facility] [phone]

## 2024-09-08 NOTE — Telephone Encounter (Signed)
 Completed refill form for Ozempic and faxed to provider office for review and signature.

## 2024-09-09 ENCOUNTER — Inpatient Hospital Stay: Admitting: Internal Medicine

## 2024-09-10 ENCOUNTER — Encounter: Payer: Self-pay | Admitting: Internal Medicine

## 2024-09-10 ENCOUNTER — Ambulatory Visit: Admitting: Internal Medicine

## 2024-09-10 VITALS — BP 98/64 | Ht 63.0 in | Wt 157.8 lb

## 2024-09-10 DIAGNOSIS — E1142 Type 2 diabetes mellitus with diabetic polyneuropathy: Secondary | ICD-10-CM | POA: Diagnosis not present

## 2024-09-10 DIAGNOSIS — I25118 Atherosclerotic heart disease of native coronary artery with other forms of angina pectoris: Secondary | ICD-10-CM

## 2024-09-10 DIAGNOSIS — E782 Mixed hyperlipidemia: Secondary | ICD-10-CM

## 2024-09-10 DIAGNOSIS — R7989 Other specified abnormal findings of blood chemistry: Secondary | ICD-10-CM

## 2024-09-10 DIAGNOSIS — Z794 Long term (current) use of insulin: Secondary | ICD-10-CM

## 2024-09-10 DIAGNOSIS — Q245 Malformation of coronary vessels: Secondary | ICD-10-CM

## 2024-09-10 DIAGNOSIS — I10 Essential (primary) hypertension: Secondary | ICD-10-CM

## 2024-09-10 NOTE — Telephone Encounter (Signed)
 Received provider portion PAP application ([place]) Trulicity and Basaglar .

## 2024-09-10 NOTE — Progress Notes (Signed)
 Subjective:    Patient ID: Brittany Pruitt, female    DOB: Aug 26, 1961, 63 y.o.   MRN: [id number]  HPI  Discussed the use of AI scribe software for clinical note transcription with the patient, who gave verbal consent to proceed.  Brittany Pruitt is a 63 year old female with a history of DM2, COPD, HTN, HLD who presents for a hospital follow-up after experiencing chest pain.  She experienced severe chest pain, waking up around 10 PM with pain radiating to her left arm, left side of her face, and jaw. Her husband called EMS, who performed an EKG that appeared normal.  EMS left but the chest pain persisted so she presented to the ER October 21 for further evaluation.  During her hospital stay, her initial troponin level was slightly elevated at 47, but it trended down to 43. Her metabolic panel was normal, and her white blood cell count was slightly elevated at 16.1, which she attributes to chronic leukocytosis secondary to smoking. A D-dimer test was normal, and she was placed on a heparin drip. 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 Her chest X-ray and echocardiogram were normal.  She was discharged with changes to her medication regimen, including stopping lisinopril  due to low blood pressure and switching from simvastatin  to atorvastatin. She is currently taking atorvastatin and aspirin  daily. She was originally on lisinopril  for kidney protection due to diabetes not hypertension.      She has not had any additional chest pain since her hospital visit.  She reports she has not scheduled an appoint with cardiology but plans to do this soon.  Review of Systems     Past Medical History:  Diagnosis Date   Anxiety    Arthritis 2019   just found by x-ray   Cataract 2018   monitoring   Chicken pox    Depression    Diabetes mellitus without complication  (HCC)    Frequent headaches    Hyperlipidemia    Hypertension 2018   fluctuates   Neuromuscular disorder (HCC) 2017   medication treatment    Current Outpatient Medications  Medication Sig Dispense Refill   acetaminophen  (TYLENOL ) 500 MG tablet Take 500 mg by mouth every 6 (six) hours as needed for moderate pain.     aspirin  EC 81 MG tablet Take 1 tablet (81 mg total) by mouth daily. Swallow whole.     atorvastatin (LIPITOR) 40 MG tablet Take 1 tablet (40 mg total) by mouth daily. 90 tablet 0   Blood Glucose Monitoring Suppl DEVI 1 each by Does not apply route in the morning, at noon, and at bedtime. May substitute to any manufacturer covered by patient's insurance. 1 each 0   busPIRone  (BUSPAR ) 5 MG tablet TAKE 1 TABLET TWICE A DAY 180 tablet 1   Cholecalciferol (VITAMIN D3) 50 MCG (2000 UT) capsule Take 2,000 Units by mouth daily.     citalopram  (CELEXA ) 20 MG tablet TAKE 1 TABLET DAILY 90 tablet 1   clonazePAM  (KLONOPIN ) 0.5 MG tablet TAKE ONE-HALF (1/2) TO ONE TABLET TWICE A DAY AS NEEDED FOR ANXIETY 45 tablet 0   cyanocobalamin (VITAMIN B12) 1000 MCG tablet Take 1,000 mcg by mouth daily.     gabapentin  (NEURONTIN ) 300 MG capsule Take 2 capsules (600 mg total) by mouth at bedtime. 360 capsule 1   glipiZIDE  (GLUCOTROL ) 5 MG tablet TAKE 1 TABLET TWICE A DAY BEFORE MEALS  180 tablet 1   LANTUS  SOLOSTAR 100 UNIT/ML Solostar Pen INJECT 35 UNITS UNDER THE SKIN TWICE A DAY 60 mL 3   meclizine  (ANTIVERT ) 25 MG tablet TAKE 1 TABLET THREE TIMES A DAY AS NEEDED FOR DIZZINESS 30 tablet 35   Multiple Vitamins-Minerals (CENTRUM SILVER 50+WOMEN PO) Take 1 tablet by mouth daily.     nitroGLYCERIN (NITROSTAT) 0.4 MG SL tablet Place 1 tablet (0.4 mg total) under the tongue every 5 (five) minutes as needed for chest pain. 25 tablet 12   Omega-3 Fatty Acids (FISH OIL) 1000 MG CAPS Take 1 capsule by mouth in the morning and at bedtime.     omeprazole  (PRILOSEC) 20 MG capsule TAKE 1 CAPSULE DAILY 90 capsule  3   Semaglutide, 1 MG/DOSE, (OZEMPIC, 1 MG/DOSE,) 4 MG/3ML SOPN Inject 1 mg into the skin once a week.     No current facility-administered medications for this visit.    Allergies  Allergen Reactions   Sulfa Antibiotics Hives    Family History  Problem Relation Age of Onset   Uterine cancer Mother    Breast cancer Mother 37   Pancreatic cancer Mother    Arthritis Mother    Cancer Mother    Hyperlipidemia Mother    Heart disease Father    Hypertension Father    Anxiety disorder Father    Diabetes Father    Arthritis Father    COPD Father    Depression Father    Hearing loss Father    Hyperlipidemia Father    Obesity Father    Vision loss Father    Uterine cancer Maternal Aunt    Breast cancer Maternal Aunt 60   Heart disease Paternal Grandfather    Diabetes Paternal Grandfather    Early death Paternal Grandfather    Hearing loss Paternal Grandfather    Miscarriages / Stillbirths Maternal Grandmother    Varicose Veins Paternal Grandmother    Anxiety disorder Daughter    Cancer Maternal Aunt     Social History   Socioeconomic History   Marital status: Married    Spouse name: Jonte Wollam   Number of children: Not on file   Years of education: Not on file   Highest education level: 12th grade  Occupational History   Not on file  Tobacco Use   Smoking status: Some Days    Current packs/day: 0.00    Average packs/day: 1 pack/day for 30.0 years (30.0 ttl pk-yrs)    Types: Cigarettes    Last attempt to quit: 01/14/2023    Years since quitting: 1.6   Smokeless tobacco: Never  Vaping Use   Vaping status: Never Used  Substance and Sexual Activity   Alcohol use: No    Alcohol/week: 0.0 standard drinks of alcohol   Drug use: Never   Sexual activity: Yes    Partners: Male  Other Topics Concern   Not on file  Social History Narrative   Pt lives with husband Ozell.  Is not currently working.    Social Drivers of Corporate Investment Banker Strain: Low  Risk  (07/07/2024)   Overall Financial Resource Strain (CARDIA)    Difficulty of Paying Living Expenses: Not very hard  Food Insecurity: No Food Insecurity (08/24/2024)   Hunger Vital Sign    Worried About Running Out of Food in the Last Year: Never true    Ran Out of Food in the Last Year: Never true  Transportation Needs: No Transportation Needs (08/24/2024)   PRAPARE -  Administrator, Civil Service (Medical): No    Lack of Transportation (Non-Medical): No  Physical Activity: Unknown (07/07/2024)   Exercise Vital Sign    Days of Exercise per Week: Patient declined    Minutes of Exercise per Session: Not on file  Stress: Stress Concern Present (07/07/2024)   [employer] of Occupational Health - Occupational Stress Questionnaire    Feeling of Stress: To some extent  Social Connections: Moderately Isolated (08/24/2024)   Social Connection and Isolation Panel    Frequency of Communication with Friends and Family: More than three times a week    Frequency of Social Gatherings with Friends and Family: Twice a week    Attends Religious Services: Patient declined    Database Administrator or Organizations: No    Attends Banker Meetings: Never    Marital Status: Married  Catering Manager Violence: Not At Risk (08/24/2024)   Humiliation, Afraid, Rape, and Kick questionnaire    Fear of Current or Ex-Partner: No    Emotionally Abused: No    Physically Abused: No    Sexually Abused: No     Constitutional: Patient reports intermittent headaches.  Denies fever, malaise, fatigue, or abrupt weight changes.  HEENT: Denies eye pain, eye redness, ear pain, ringing in the ears, wax buildup, runny nose, nasal congestion, bloody nose, or sore throat. Respiratory: Denies difficulty breathing, shortness of breath, cough or sputum production.   Cardiovascular: Denies chest pain, chest tightness, palpitations or swelling in the hands or feet.  Gastrointestinal: Denies abdominal  pain, bloating, constipation, diarrhea or blood in the stool.  GU: Denies urgency, frequency, pain with urination, burning sensation, blood in urine, odor or discharge. Musculoskeletal: Denies decrease in range of motion, difficulty with gait, muscle pain or joint pain and swelling.  Skin: Denies redness, rashes, lesions or ulcercations.  Neurological: Patient reports neuropathic pain, difficulty with speech.  Denies dizziness, difficulty with memory, or problems with balance and coordination.  Psych: Patient has a history of anxiety and depression.  Denies SI/HI.  No other specific complaints in a complete review of systems (except as listed in HPI above).  Objective:  BP 98/64 (BP Location: Right Arm, Patient Position: Sitting, Cuff Size: Normal)   Ht 5' 3 (1.6 m)   Wt 157 lb 12.8 oz (71.6 kg)   BMI 27.95 kg/m     Wt Readings from Last 3 Encounters:  08/24/24 155 lb (70.3 kg)  07/08/24 155 lb 6.4 oz (70.5 kg)  01/06/24 160 lb 3.2 oz (72.7 kg)    General: Appears her stated age, overweight, in NAD. Skin: Warm, dry and intact.  No ulcerations noted. HEENT: Head: normal shape and size; Eyes: sclera white, no icterus, conjunctiva pink, PERRLA and EOMs intact;  Cardiovascular: Normal rate and rhythm. S1,S2 noted.  No murmur, rubs or gallops noted. No JVD or BLE edema. No carotid bruits noted. Pulmonary/Chest: Normal effort and positive vesicular breath sounds. No respiratory distress. No wheezes, rales or ronchi noted.  Musculoskeletal: No difficulty with gait.  Neurological: Alert and oriented. Dysphonic speech noted.  Coordination normal.      BMET    Component Value Date/Time   NA 138 08/24/2024 0013   NA 141 03/16/2018 1537   NA 137 05/06/2012 0949   K 3.8 08/24/2024 0013   K 4.5 05/06/2012 0949   CL 103 08/24/2024 0013   CL 103 05/06/2012 0949   CO2 22 08/24/2024 0013   CO2 27 05/06/2012 0949   GLUCOSE  177 (H) 08/24/2024 0013   GLUCOSE 123 (H) 05/06/2012 0949   BUN  22 08/24/2024 0013   BUN 13 03/16/2018 1537   BUN 10 05/06/2012 0949   CREATININE 0.86 08/24/2024 0013   CREATININE 0.70 07/08/2024 1108   CALCIUM 9.5 08/24/2024 0013   CALCIUM 9.2 05/06/2012 0949   GFRNONAA >60 08/24/2024 0013   GFRNONAA >60 05/06/2012 0949   GFRAA 103 03/16/2018 1537   GFRAA >60 05/06/2012 0949    Lipid Panel     Component Value Date/Time   CHOL 177 07/08/2024 1108   CHOL 135 02/28/2017 1604   TRIG 141 07/08/2024 1108   HDL 42 (L) 07/08/2024 1108   HDL 38 (L) 02/28/2017 1604   CHOLHDL 4.2 07/08/2024 1108   VLDL 34.0 12/08/2020 1432   LDLCALC 110 (H) 07/08/2024 1108    CBC    Component Value Date/Time   WBC 16.1 (H) 08/24/2024 0013   RBC 5.02 08/24/2024 0013   HGB 14.7 08/24/2024 0013   HGB 15.6 03/16/2018 1537   HCT 45.7 08/24/2024 0013   HCT 47.5 (H) 03/16/2018 1537   PLT 275 08/24/2024 0013   PLT 314 03/16/2018 1537   MCV 91.0 08/24/2024 0013   MCV 88 03/16/2018 1537   MCV 88 05/06/2012 0949   MCH 29.3 08/24/2024 0013   MCHC 32.2 08/24/2024 0013   RDW 14.6 08/24/2024 0013   RDW 14.1 03/16/2018 1537   RDW 13.7 05/06/2012 0949   LYMPHSABS 1.7 10/31/2022 0341   LYMPHSABS 5.1 (H) 02/28/2017 1604   MONOABS 0.7 10/31/2022 0341   EOSABS 0.0 10/31/2022 0341   EOSABS 0.1 02/28/2017 1604   BASOSABS 0.0 10/31/2022 0341   BASOSABS 0.1 02/28/2017 1604    Hgb A1C Lab Results  Component Value Date   HGBA1C 6.0 (H) 07/08/2024            Assessment & Plan:  Assessment and Plan    Hospital follow-up for chest pain, elevated troponin, hypotension  Hospital notes, labs and imaging reviewed.  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 Myocardial bridging of mid LAD causing moderate systolic stenosis. Cardiac catheterization showed no significant buildup. Troponin levels initially elevated but decreased. Normal  clotting factors and D-dimer. Echocardiogram normal. Risk of myocardial infarction if spasms persist. - Continue atorvastatin for higher intensity statin therapy. - Hold lisinopril  due to low blood pressure. - Follow up with cardiology for further management and monitoring.  Chest pain Intermittent chest pain radiating to left arm and jaw, initially suspected myocardial infarction. EKG normal. Troponin levels elevated but decreased. Myocardial bridging likely cause. No significant stenosis or clotting issues. Echocardiogram normal. - Continue aspirin  once daily. - Follow up with cardiology for further evaluation and management.        RTC in 4 months, follow-up chronic conditions Angeline Laura, NP

## 2024-09-10 NOTE — Patient Instructions (Signed)
 Coronary Artery Disease, Female Coronary artery disease (CAD) is a condition in which the arteries that lead to the heart (coronary arteries) become narrow or blocked. The narrowing or blockage can lead to decreased blood flow to the heart. Prolonged reduced blood flow can cause a heart attack (myocardial infarction, or MI). This condition may also be called coronary heart disease. CAD is the most common type of heart disease, and heart disease is the leading cause of death in women. It is important to understand what causes CAD and how it is treated. What are the causes? CAD is most often caused by atherosclerosis. This is the buildup of fat and cholesterol (plaque) on the inside of the arteries. Over time, the plaque may narrow or block the artery, reducing blood flow to the heart. Plaque can also become weak and break off within a coronary artery and cause a sudden blockage. Other less common causes of CAD include: A blood clot or a piece of another substance that blocks the flow of blood in a coronary artery (embolism). A tearing of the artery (spontaneous coronary artery dissection). An enlargement of an artery (aneurysm). Inflammation (vasculitis) in the artery wall. What increases the risk? The following factors may make you more likely to develop this condition: Age. Women older than 55 years are at a greater risk of CAD. Family history of CAD. High blood pressure (hypertension). Diabetes. High cholesterol levels. Obesity. Menopause. All postmenopausal women are at greater risk of CAD. Women who have experienced menopause between the ages of 39 and 58 (early menopause) are at a higher risk of CAD. Women who have experienced menopause before age 80 (premature menopause) are at a very high risk of CAD. Other risk factors include: Tobacco use. Excessive alcohol use. Lack of exercise. A diet high in saturated and trans fats, such as fried food and processed meat. What are the signs or  symptoms? Many people do not have any symptoms during the early stages of CAD. As the condition progresses, symptoms may include: Chest pain (angina). The pain can: Feel like crushing or squeezing, or like a tightness, pressure, fullness, or heaviness in the chest. Last more than a few minutes or can stop and recur. The pain tends to get worse with exercise or stress and to fade with rest. Pain in the arms, neck, jaw, ear, or back. Unexplained heartburn or indigestion. Shortness of breath. Nausea. Sudden light-headedness. Sudden cold sweats. Fluttering or fast heartbeat (palpitations). Many women have chest discomfort and the other symptoms. However, women often have unusual (atypical) symptoms, such as: Fatigue. Vomiting. Unexplained feelings of nervousness or anxiety. Unexplained weakness. Dizziness or fainting. How is this diagnosed? This condition is diagnosed based on: Your family and medical history. A physical exam. Tests. These may include: A test to check the electrical signals in your heart (electrocardiogram). Exercise stress test. This looks for signs of blockage when the heart is stressed with exercise, such as running on a treadmill. Pharmacologic stress test. This test looks for signs of blockage when the heart is being stressed with a medicine. Blood tests to check levels of cardiac enzymes such as troponin and creatine kinase. Coronary angiogram. This is a procedure to look at the coronary arteries to see if there is any blockage. During this test, a dye is injected into your arteries so they appear on an X-ray. Coronary artery CT scan. This scan helps detect calcium deposits in your coronary arteries. Calcium deposits are an indicator of CAD. A test that uses  sound waves to take a picture of your heart (echocardiogram). How is this treated? This condition may be treated by: Healthy lifestyle changes to reduce risk factors. Medicines such as: Antiplatelet medicines  such as clopidogrel or aspirin. These help to prevent blood clots. Nitroglycerin. Blood pressure medicines. Cholesterol-lowering medicine. Coronary angioplasty and stenting. During this procedure, a thin, flexible tube is inserted through a blood vessel and into a blocked artery. A balloon or similar device on the end of the tube is inflated to open up the artery. In some cases, a small, mesh tube (stent) is inserted into the artery to keep it open. Coronary artery bypass surgery. During this surgery, veins or arteries from other parts of the body are used to create a bypass around the blockage and allow blood to reach your heart. Follow these instructions at home: Medicines Take over-the-counter and prescription medicines only as told by your health care provider. Do not take the following medicines unless your health care provider approves: NSAIDs, such as ibuprofen, naproxen, or celecoxib. Vitamin supplements that contain vitamin A, vitamin E, or both. Hormone replacement therapy that contains estrogen with or without progestin. Lifestyle Follow an exercise program approved by your health care provider. Ask your health care provider if cardiac rehab is appropriate. Maintain a healthy weight or lose weight as approved by your health care provider. Learn to manage stress or try to limit your stress. Ask your health care provider for suggestions if you need help. Get screened for depression and seek treatment, if needed. Do not use any products that contain nicotine or tobacco. These products include cigarettes, chewing tobacco, and vaping devices, such as e-cigarettes. If you need help quitting, ask your health care provider. Eating and drinking  Follow a heart-healthy diet. A dietitian can help educate you about healthy food options and changes. In general, eat plenty of fruits and vegetables, lean meats, and whole grains. Avoid foods high in: Sugar. Salt (sodium). Saturated fats, such as  processed or fatty meat. Trans fats, such as fried food. Use healthy cooking methods such as roasting, grilling, broiling, baking, poaching, steaming, or stir-frying. Do not drink alcohol if: Your health care provider tells you not to drink. You are pregnant, may be pregnant, or are planning to become pregnant. If you drink alcohol: Limit how much you have to 0-1 drink a day. Know how much alcohol is in your drink. In the [place] one drink equals one 12 oz bottle of beer (355 mL), one 5 oz glass of wine (148 mL), or one 1 oz glass of hard liquor (44 mL). General instructions Manage any other health conditions, such as high cholesterol, hypertension, and diabetes. These conditions affect your heart. Your health care provider may ask you to monitor your blood pressure. Keep all follow-up visits. This is important. Get help right away if: You have pain in your chest, neck, ear, arm, jaw, stomach, or back that: Lasts more than a few minutes. Is recurring. Is not relieved by taking medicine under your tongue (sublingual nitroglycerin). You have profuse sweating without cause. You have unexplained: Heartburn or indigestion. Shortness of breath or difficulty breathing. Fluttering or fast heartbeat (palpitations). Fatigue or weakness. Nausea or vomiting. Feelings of nervousness or anxiety. You have sudden light-headedness or dizziness. You faint. These symptoms may be an emergency. Get help right away. Call 911. Do not wait to see if the symptoms will go away. Do not drive yourself to the hospital. Summary Coronary artery disease (CAD) is a condition in  which the arteries that lead to the heart (coronary arteries) become narrow or blocked. Prolonged reduced blood flow can cause a heart attack. Many women have chest discomfort and other common symptoms of CAD. However, women often have unusual (atypical) symptoms, such as fatigue, vomiting, weakness, or dizziness. CAD can be treated with  lifestyle changes, medicines, coronary angioplasty or stents, coronary artery bypass surgery, or a combination of these treatments. Keep all follow-up visits. This is important. This information is not intended to replace advice given to you by your health care provider. Make sure you discuss any questions you have with your health care provider. Document Revised: 09/19/2021 Document Reviewed: 09/19/2021 Elsevier Patient Education  2024 [employer].

## 2024-09-22 NOTE — Telephone Encounter (Signed)
 PAP: Application for Basaglar and Trulicity has been submitted to [employer], via fax

## 2024-09-24 ENCOUNTER — Other Ambulatory Visit: Payer: Self-pay | Admitting: Internal Medicine

## 2024-09-24 DIAGNOSIS — F32A Depression, unspecified: Secondary | ICD-10-CM

## 2024-09-25 NOTE — Telephone Encounter (Signed)
 Requested medication (s) are due for refill today: Yes  Requested medication (s) are on the active medication list: Yes  Last refill:  08/01/24  Future visit scheduled: Yes  Notes to clinic:  Not delegated.    Requested Prescriptions  Pending Prescriptions Disp Refills   clonazePAM  (KLONOPIN ) 0.5 MG tablet [Pharmacy Med Name: CLONAZEPAM  TABS 0.5MG ] 45 tablet 0    Sig: TAKE ONE-HALF (1/2) TO ONE TABLET TWICE A DAY AS NEEDED FOR ANXIETY     Not Delegated - Psychiatry: Anxiolytics/Hypnotics 2 Failed - 09/25/2024  4:28 PM      Failed - This refill cannot be delegated      Failed - Urine Drug Screen completed in last 360 days      Passed - Patient is not pregnant      Passed - Valid encounter within last 6 months    Recent Outpatient Visits           2 weeks ago Atherosclerosis of native coronary artery of native heart with other form of angina pectoris   [facility] [place], [place] W, NP   2 months ago Encounter for general adult medical examination with abnormal findings   [place] [facility] [place], Angeline ORN, NP   8 months ago Type 2 diabetes mellitus with diabetic polyneuropathy, with long-term current use of insulin  [facility])   [place] [facility] [place], Angeline ORN, NP              Signed Prescriptions Disp Refills   LANTUS  SOLOSTAR 100 UNIT/ML Solostar Pen 60 mL 3    Sig: INJECT 35 UNITS UNDER THE SKIN TWICE A DAY     Endocrinology:  Diabetes - Insulins Passed - 09/25/2024  4:28 PM      Passed - HBA1C is between 0 and 7.9 and within 180 days    Hgb A1c MFr Bld  Date Value Ref Range Status  07/08/2024 6.0 (H) <5.7 % Final    Comment:    For someone without known diabetes, a hemoglobin  A1c value between 5.7% and 6.4% is consistent with prediabetes and should be confirmed with a  follow-up test. . For someone with known diabetes, a value <7% indicates that their diabetes is well controlled.  A1c targets should be individualized based on duration of diabetes, age, comorbid conditions, and other considerations. . This assay result is consistent with an increased risk of diabetes. . Currently, no consensus exists regarding use of hemoglobin A1c for diagnosis of diabetes for children. SABRA Amy - Valid encounter within last 6 months    Recent Outpatient Visits           2 weeks ago Atherosclerosis of native coronary artery of native heart with other form of angina pectoris   [place] [facility] [place], Angeline ORN, NP   2 months ago Encounter for general adult medical examination with abnormal findings   [place] [facility] [place], Angeline ORN, NP   8 months ago Type 2 diabetes mellitus with diabetic polyneuropathy, with long-term current use of insulin  [facility])   [place] [facility] [place], Angeline ORN, [place]

## 2024-09-25 NOTE — Telephone Encounter (Signed)
 Requested Prescriptions  Pending Prescriptions Disp Refills   LANTUS  SOLOSTAR 100 UNIT/ML Solostar Pen [Pharmacy Med Name: LANTUS  SOLOSTAR PEN 5'S 100U/ML] 60 mL 3    Sig: INJECT 35 UNITS UNDER THE SKIN TWICE A DAY     Endocrinology:  Diabetes - Insulins Passed - 09/25/2024  4:28 PM      Passed - HBA1C is between 0 and 7.9 and within 180 days    Hgb A1c MFr Bld  Date Value Ref Range Status  07/08/2024 6.0 (H) <5.7 % Final    Comment:    For someone without known diabetes, a hemoglobin  A1c value between 5.7% and 6.4% is consistent with prediabetes and should be confirmed with a  follow-up test. . For someone with known diabetes, a value <7% indicates that their diabetes is well controlled. A1c targets should be individualized based on duration of diabetes, age, comorbid conditions, and other considerations. . This assay result is consistent with an increased risk of diabetes. . Currently, no consensus exists regarding use of hemoglobin A1c for diagnosis of diabetes for children. SABRA Amy - Valid encounter within last 6 months    Recent Outpatient Visits           2 weeks ago Atherosclerosis of native coronary artery of native heart with other form of angina pectoris   [facility], Angeline ORN, NP   2 months ago Encounter for general adult medical examination with abnormal findings   [place] [facility] [place], Angeline ORN, NP   8 months ago Type 2 diabetes mellitus with diabetic polyneuropathy, with long-term current use of insulin  [facility])   [place] [place], Angeline ORN, NP               clonazePAM  (KLONOPIN ) 0.5 MG tablet [Pharmacy Med Name: CLONAZEPAM  TABS 0.5MG ] 45 tablet 0    Sig: TAKE ONE-HALF (1/2) TO ONE TABLET TWICE A DAY AS NEEDED FOR ANXIETY     Not Delegated - Psychiatry: Anxiolytics/Hypnotics 2 Failed - 09/25/2024  4:28 PM      Failed - This refill cannot be delegated       Failed - Urine Drug Screen completed in last 360 days      Passed - Patient is not pregnant      Passed - Valid encounter within last 6 months    Recent Outpatient Visits           2 weeks ago Atherosclerosis of native coronary artery of native heart with other form of angina pectoris   [facility] [place], Angeline ORN, NP   2 months ago Encounter for general adult medical examination with abnormal findings   [facility] [place], [place], NP   8 months ago Type 2 diabetes mellitus with diabetic polyneuropathy, with long-term current use of insulin  [facility])    [facility] [place], Angeline ORN, [place]

## 2024-09-29 NOTE — Telephone Encounter (Signed)
 PAP: Patient assistance application for Basaglar  and Trulicity has been approved by PAP Companies: [employer] from 11/04/2024 to 11/03/2025. Medication should be delivered to PAP Delivery: Home. For further shipping updates, please contact [employer] at [phone]. Patient ID is: [id number]

## 2024-10-04 ENCOUNTER — Telehealth: Admitting: Emergency Medicine

## 2024-10-04 DIAGNOSIS — R3989 Other symptoms and signs involving the genitourinary system: Secondary | ICD-10-CM

## 2024-10-04 MED ORDER — CEPHALEXIN 500 MG PO CAPS: 500.0000 mg | ORAL_CAPSULE | Freq: Two times a day (BID) | ORAL | 0 refills | Status: AC

## 2024-10-04 NOTE — Progress Notes (Signed)

## 2024-10-05 ENCOUNTER — Other Ambulatory Visit: Payer: Self-pay

## 2024-11-01 ENCOUNTER — Other Ambulatory Visit

## 2024-11-03 ENCOUNTER — Other Ambulatory Visit: Admitting: Pharmacist

## 2024-11-03 ENCOUNTER — Telehealth: Admitting: Physician Assistant

## 2024-11-03 DIAGNOSIS — E119 Type 2 diabetes mellitus without complications: Secondary | ICD-10-CM

## 2024-11-03 DIAGNOSIS — J069 Acute upper respiratory infection, unspecified: Secondary | ICD-10-CM

## 2024-11-03 DIAGNOSIS — E1142 Type 2 diabetes mellitus with diabetic polyneuropathy: Secondary | ICD-10-CM

## 2024-11-03 DIAGNOSIS — Z794 Long term (current) use of insulin: Secondary | ICD-10-CM

## 2024-11-03 MED ORDER — BENZONATATE 100 MG PO CAPS: 100.0000 mg | ORAL_CAPSULE | Freq: Three times a day (TID) | ORAL | 0 refills | Status: AC | PRN

## 2024-11-03 MED ORDER — FLUTICASONE PROPIONATE 50 MCG/ACT NA SUSP: 2.0000 | Freq: Every day | NASAL | 0 refills | Status: AC

## 2024-11-03 NOTE — Progress Notes (Signed)

## 2024-11-03 NOTE — Progress Notes (Signed)
 "  11/03/2024 Name: Brittany Pruitt MRN: [id number] DOB: [birth year]  Chief Complaint  Patient presents with   Medication Management   Medication Assistance    Brittany Pruitt is a 63 y.o. year old female who presented for a telephone visit.   They were referred to the pharmacist by their PCP for assistance in managing diabetes and medication access.      Subjective:   Care Team: Primary Care Provider: Antonette Angeline ORN, NP ; Next Scheduled Visit: 01/06/2025    Medication Access/Adherence  Current Pharmacy:  [facility] - [place], [place] - [address] [place] [address] Phone: [phone] Fax: [phone]  [id number] [place], [place] - [address] AT [facility] [address] [place] [address] Phone: [phone] Fax: [phone]  [facility] [address] [place] [address] Phone: [phone] Fax: [phone]  Patient reports affordability concerns with their medications: No  Patient reports access/transportation concerns to their pharmacy: No  Patient reports adherence concerns with their medications:  No     Diabetes:   Current medications:  - insulin  glargine (Lantus /Basaglar ) 35 units twice daily - Ozempic 1 mg weekly             Reports tolerating well - glipizide  5 mg twice daily before meals   Medications tried in the past: metformin  (GI side effects)   Recalls current glucose readings ranging: morning fasting: 90s-105   Denies recent symptoms of hypoglycemia - Carries glucose tablets with her in case needed for low blood sugar   Current physical activity: reports active walking and gardening most days/week   Statin: atorvastatin  40 mg daily   Current medication access support: enrolled in patient assistance for Trulicity and Basaglar  through 11/03/2025 Reports has received a shipment of 1 month  supply of Trulicity 4.5 mg. Patient has not yet started taking Trulicity as she has ~2 month supply of Ozempic remaining, but plans to start 1 week after her last dose of Ozempic, will be taking Trulicity 4.5 mg once weekly    Tobacco Abuse:   Reports currently smoking 1/2-1 pack/day   Previously tried: nicotine  patches (skin irritation), Chantix (restarted smoking whenever stopped)   Denies interest in setting quit date at this time; enjoys smoking     Objective:  Lab Results  Component Value Date   HGBA1C 6.0 (H) 07/08/2024    Lab Results  Component Value Date   CREATININE 0.86 08/24/2024   BUN 22 08/24/2024   NA 138 08/24/2024   K 3.8 08/24/2024   CL 103 08/24/2024   CO2 22 08/24/2024    Lab Results  Component Value Date   CHOL 177 07/08/2024   HDL 42 (L) 07/08/2024   LDLCALC 110 (H) 07/08/2024   TRIG 141 07/08/2024   CHOLHDL 4.2 07/08/2024     Medications Reviewed Today     Reviewed by Alana Sharyle LABOR, RPH-CPP (Pharmacist) on 11/03/24 at 1014  Med List Status: <None>   Medication Order Taking? Sig Documenting Provider Last Dose Status Informant  acetaminophen  (TYLENOL ) 500 MG tablet [id number]  Take 500 mg by mouth every 6 (six) hours as needed for moderate pain. [provider]  Active Self  aspirin  EC 81 MG tablet [id number]  Take 1 tablet (81 mg total) by mouth daily. Swallow whole. Antonette Angeline ORN, NP  Active Self  atorvastatin  (LIPITOR) 40 MG tablet [id number]  Yes Take 1 tablet (40 mg total) by mouth daily. Dorinda Drue DASEN, MD  Active   Blood Glucose Monitoring Suppl DEVI [id number]  1 each by Does not apply route in the morning, at noon, and at bedtime. May substitute to any manufacturer covered by patient's insurance. Antonette Angeline ORN, NP  Active Self  busPIRone  (BUSPAR ) 5 MG tablet [id number]  TAKE 1 TABLET TWICE A DAY [name], Angeline ORN, NP  Active Self  Cholecalciferol (VITAMIN D3) 50 MCG (2000 UT) capsule [id number]  Take 2,000 Units by mouth daily.  [provider]  Active Self  citalopram  (CELEXA ) 20 MG tablet [id number]  TAKE 1 TABLET DAILY Antonette Angeline ORN, NP  Active Self  clonazePAM  (KLONOPIN ) 0.5 MG tablet [id number]  TAKE ONE-HALF (1/2) TO ONE TABLET TWICE A DAY AS NEEDED FOR ANXIETY [name], Angeline ORN, NP  Active   cyanocobalamin (VITAMIN B12) 1000 MCG tablet [id number]  Take 1,000 mcg by mouth daily. [provider]  Active Self  gabapentin  (NEURONTIN ) 300 MG capsule [id number]  Take 2 capsules (600 mg total) by mouth at bedtime. Dorinda Drue DASEN, MD  Active   glipiZIDE  (GLUCOTROL ) 5 MG tablet [id number] Yes TAKE 1 TABLET TWICE A DAY BEFORE MEALS Antonette Angeline ORN, NP  Active Self  LANTUS  SOLOSTAR 100 UNIT/ML Solostar Pen [id number] Yes INJECT 35 UNITS UNDER THE SKIN TWICE A DAY Antonette Angeline ORN, NP  Active   meclizine  (ANTIVERT ) 25 MG tablet [id number]  TAKE 1 TABLET THREE TIMES A DAY AS NEEDED FOR DIZZINESS Antonette Angeline ORN, NP  Active Self  Multiple Vitamins-Minerals (CENTRUM SILVER 50+WOMEN PO) [id number]  Take 1 tablet by mouth daily. [provider]  Active Self  nitroGLYCERIN  (NITROSTAT ) 0.4 MG SL tablet [id number]  Place 1 tablet (0.4 mg total) under the tongue every 5 (five) minutes as needed for chest pain. Dorinda Drue DASEN, MD  Active   Omega-3 Fatty Acids (FISH OIL) 1000 MG CAPS [id number]  Take 1 capsule by mouth in the morning and at bedtime. [provider]  Active Self  omeprazole  (PRILOSEC) 20 MG capsule [id number]  TAKE 1 CAPSULE DAILY [name], Angeline ORN, NP  Active Self  Semaglutide, 1 MG/DOSE, (OZEMPIC, 1 MG/DOSE,) 4 MG/3ML SOPN [id number] Yes Inject 1 mg into the skin once a week. [provider]  Active Self              Assessment/Plan:   Encourage patient to schedule appointment with Cardiology related to previous chest pain as recommended by PCP Patient confirms has phone number for [facility] and will call to schedule in the new year  Diabetes: - Reviewed goal A1c, goal fasting, and  goal 2 hour post prandial glucose - Reviewed dietary modifications including Encourage patient to have regular well-balanced meals and snacks spread throughout the day, while controlling carbohydrate portion sizes             Advise patient against skipping meals - Counsel patient to continue to monitor home blood sugar, keep log of results, have this record to review during upcoming appointments and to contact office sooner if needed for reading outside of established parameters or for symptoms   - Patient to contact [employer] patient assistance as needed for refills of Basaglar  insulin  and Trulicity      Follow Up Plan: Clinical Pharmacist will follow up with patient by telephone on 04/11/2025 at 10:00 AM      Sharyle Sia, PharmD, [facility] Clinical Pharmacist [facility] [phone]   "

## 2024-11-03 NOTE — Patient Instructions (Signed)
"   Goals Addressed             This Visit's Progress    Pharmacy Goals       If you need to reach out to patient assistance programs regarding refills or to find out the status of your application, you can do so by calling:  [employer] [phone]  Our goal A1c is less than 7%. This corresponds with fasting sugars less than 130 and 2 hour after meal sugars less than 180. Please keep a log of your results when checking your blood sugar   Our goal bad cholesterol, or LDL, is less than 70 . This is why it is important to continue taking your simvastatin .  Please have your medications and blood sugar log with you for our next telephone call.   Sharyle Sia, PharmD, [facility] Clinical Pharmacist [facility] [phone]         "

## 2024-11-04 ENCOUNTER — Telehealth

## 2024-11-15 ENCOUNTER — Other Ambulatory Visit: Payer: Self-pay | Admitting: Internal Medicine

## 2024-11-16 ENCOUNTER — Other Ambulatory Visit: Payer: Self-pay

## 2024-11-16 NOTE — Telephone Encounter (Signed)
 Requested medication (s) are due for refill today: routing for review  Requested medication (s) are on the active medication list: no  Last refill:  n/a  Future visit scheduled: yes  Notes to clinic:  Unable to refill per protocol, Rx expired.      Requested Prescriptions  Pending Prescriptions Disp Refills   TRULICITY 4.5 MG/0.5ML SOAJ [Pharmacy Med Name: Trulicity Subcutaneous Solution Auto-injector 4.5 MG/0.5ML] 2 mL 0    Sig: INJECT 4.5MG  (0.5ML) UNDER THE SKIN ONCE A WEEK     Endocrinology:  Diabetes - GLP-1 Receptor Agonists Passed - 11/16/2024 11:06 AM      Passed - HBA1C is between 0 and 7.9 and within 180 days    Hgb A1c MFr Bld  Date Value Ref Range Status  07/08/2024 6.0 (H) <5.7 % Final    Comment:    For someone without known diabetes, a hemoglobin  A1c value between 5.7% and 6.4% is consistent with prediabetes and should be confirmed with a  follow-up test. . For someone with known diabetes, a value <7% indicates that their diabetes is well controlled. A1c targets should be individualized based on duration of diabetes, age, comorbid conditions, and other considerations. . This assay result is consistent with an increased risk of diabetes. . Currently, no consensus exists regarding use of hemoglobin A1c for diagnosis of diabetes for children. SABRA Amy - Valid encounter within last 6 months    Recent Outpatient Visits           2 months ago Atherosclerosis of native coronary artery of native heart with other form of angina pectoris   [place] [facility] [place], Angeline ORN, NP   4 months ago Encounter for general adult medical examination with abnormal findings   [place] [place], [place], NP   10 months ago Type 2 diabetes mellitus with diabetic polyneuropathy, with long-term current use of insulin  [facility])   [facility] [place], [place], NP               Insulin   Glargine (BASAGLAR  KWIKPEN) 100 UNIT/ML [Pharmacy Med Name: Basaglar  KwikPen Subcutaneous Solution Pen-injector 100 UNIT/ML] 30 mL 0    Sig: DIAL AND INJECT 35 UNITS UNDER THE SKIN TWICE DAILY. MAX DAILY DOSE IS 100 UNITS.     Endocrinology:  Diabetes - Insulins Passed - 11/16/2024 11:06 AM      Passed - HBA1C is between 0 and 7.9 and within 180 days    Hgb A1c MFr Bld  Date Value Ref Range Status  07/08/2024 6.0 (H) <5.7 % Final    Comment:    For someone without known diabetes, a hemoglobin  A1c value between 5.7% and 6.4% is consistent with prediabetes and should be confirmed with a  follow-up test. . For someone with known diabetes, a value <7% indicates that their diabetes is well controlled. A1c targets should be individualized based on duration of diabetes, age, comorbid conditions, and other considerations. . This assay result is consistent with an increased risk of diabetes. . Currently, no consensus exists regarding use of hemoglobin A1c for diagnosis of diabetes for children. SABRA Amy - Valid encounter within last 6 months    Recent Outpatient Visits           2 months ago Atherosclerosis of native coronary artery of native heart with other  form of angina pectoris   [place] [facility] [place], Angeline ORN, NP   4 months ago Encounter for general adult medical examination with abnormal findings   [place] [facility] [place], [place], NP   10 months ago Type 2 diabetes mellitus with diabetic polyneuropathy, with long-term current use of insulin  [facility])   [place] [facility] [place], Angeline ORN, [place]

## 2024-11-17 ENCOUNTER — Ambulatory Visit: Admitting: Emergency Medicine

## 2024-11-17 VITALS — Ht 63.0 in | Wt 155.0 lb

## 2024-11-17 DIAGNOSIS — Z0000 Encounter for general adult medical examination without abnormal findings: Secondary | ICD-10-CM

## 2024-11-17 NOTE — Patient Instructions (Signed)
 Brittany Pruitt,  Thank you for taking the time for your Medicare Wellness Visit. I appreciate your continued commitment to your health goals. Please review the care plan we discussed, and feel free to reach out if I can assist you further.  Please note that Annual Wellness Visits do not include a physical exam. Some assessments may be limited, especially if the visit was conducted virtually. If needed, we may recommend an in-person follow-up with your provider.  Ongoing Care Seeing your primary care provider every 3 to 6 months helps us  monitor your health and provide consistent, personalized care.   Referrals If a referral was made during today's visit and you haven't received any updates within two weeks, please contact the referred provider directly to check on the status.  Recommended Screenings:  Call and schedule a diabetic eye exam with Dr. Manus Edison at your earliest convenience. You should have this exam every year.  Please call to schedule your mammogram:  [facility] Address: [address], [place], [place] Phone: [phone]     Health Maintenance  Topic Date Due   Zoster (Shingles) Vaccine (1 of 2) Never done   Colon Cancer Screening  02/08/2021   Breast Cancer Screening  09/14/2023   Medicare Annual Wellness Visit  05/18/2024   Eye exam for diabetics  06/17/2024   Screening for Lung Cancer  07/08/2025*   Yearly kidney health urinalysis for diabetes  01/05/2025   Hemoglobin A1C  01/05/2025   Complete foot exam   07/08/2025   Yearly kidney function blood test for diabetes  08/24/2025   Osteoporosis screening with Bone Density Scan  09/06/2026   DTaP/Tdap/Td vaccine (2 - Td or Tdap) 06/17/2027   Pneumococcal Vaccine for age over 37  Completed   Flu Shot  Completed   Hepatitis C Screening  Completed   HIV Screening  Completed   Hepatitis B Vaccine  Aged Out   Meningitis B Vaccine  Aged Out   COVID-19 Vaccine   Discontinued  *Topic was postponed. The date shown is not the original due date.       11/17/2024    1:54 PM  Advanced Directives  Does Patient Have a Medical Advance Directive? No  Would patient like information on creating a medical advance directive? No - Patient declined    Vision: Annual vision screenings are recommended for early detection of glaucoma, cataracts, and diabetic retinopathy. These exams can also reveal signs of chronic conditions such as diabetes and high blood pressure.  Dental: Annual dental screenings help detect early signs of oral cancer, gum disease, and other conditions linked to overall health, including heart disease and diabetes.  Please see the attached documents for additional preventive care recommendations.

## 2024-11-17 NOTE — Progress Notes (Signed)
 "  Chief Complaint  Patient presents with   Medicare Wellness     Subjective:   Brittany Pruitt is a 64 y.o. female who presents for a Medicare Annual Wellness Visit.  Visit info / Clinical Intake: Medicare Wellness Visit Type:: Subsequent Annual Wellness Visit Persons participating in visit and providing information:: patient Medicare Wellness Visit Mode:: Video Since this visit was completed virtually, some vitals may be partially provided or unavailable. Missing vitals are due to the limitations of the virtual format.: Documented vitals are patient reported If Telephone or Video please confirm:: I connected with patient using audio/video enable telemedicine. I verified patient identity with two identifiers, discussed telehealth limitations, and patient agreed to proceed. Patient Location:: home Provider Location:: clinic office Interpreter Needed?: No Pre-visit prep was completed: yes AWV questionnaire completed by patient prior to visit?: no Living arrangements:: lives with spouse/significant other Patient's Overall Health Status Rating: (!) fair Typical amount of pain: some Does pain affect daily life?: no Are you currently prescribed opioids?: no  Dietary Habits and Nutritional Risks How many meals a day?: 2 Eats fruit and vegetables daily?: yes Most meals are obtained by: preparing own meals In the last 2 weeks, have you had any of the following?: none Diabetic:: (!) yes Any non-healing wounds?: no How often do you check your BS?: 1 (FBS 117 per patient) Would you like to be referred to a Nutritionist or for Diabetic Management? : no  Functional Status Activities of Daily Living (to include ambulation/medication): Independent Ambulation: Independent with device- listed below Home Assistive Devices/Equipment: Eyeglasses (glasses for reading) Medication Administration: Independent Home Management (perform basic housework or laundry): Independent Manage your own  finances?: yes Primary transportation is: driving; family / friends Concerns about vision?: no *vision screening is required for WTM* Concerns about hearing?: (!) yes (some hearing loss) Uses hearing aids?: no  Fall Screening Falls in the past year?: 0 Number of falls in past year: 0 Was there an injury with Fall?: 0 Fall Risk Category Calculator: 0 Patient Fall Risk Level: Low Fall Risk  Fall Risk Patient at Risk for Falls Due to: No Fall Risks Fall risk Follow up: Falls evaluation completed  Home and Transportation Safety: All rugs have non-skid backing?: yes All stairs or steps have railings?: N/A, no stairs Grab bars in the bathtub or shower?: yes Have non-skid surface in bathtub or shower?: yes Good home lighting?: yes Regular seat belt use?: yes Hospital stays in the last year:: (!) yes How many hospital stays:: 1 Reason: Chest pain  Cognitive Assessment Difficulty concentrating, remembering, or making decisions? : no Will 6CIT or Mini Cog be Completed: yes What year is it?: 0 points What month is it?: 0 points Give patient an address phrase to remember (5 components): [address] [place] About what time is it?: 0 points Count backwards from 20 to 1: 0 points Say the months of the year in reverse: 0 points Repeat the address phrase from earlier: 0 points 6 CIT Score: 0 points  Advance Directives (For Healthcare) Does Patient Have a Medical Advance Directive?: No Would patient like information on creating a medical advance directive?: No - Patient declined  Reviewed/Updated  Reviewed/Updated: Reviewed All (Medical, Surgical, Family, Medications, Allergies, Care Teams, Patient Goals)    Allergies (verified) Sulfa antibiotics   Current Medications (verified) Outpatient Encounter Medications as of 11/17/2024  Medication Sig   acetaminophen  (TYLENOL ) 500 MG tablet Take 500 mg by mouth every 6 (six) hours as needed for moderate pain.  aspirin  EC 81 MG  tablet Take 1 tablet (81 mg total) by mouth daily. Swallow whole.   atorvastatin  (LIPITOR) 40 MG tablet Take 1 tablet (40 mg total) by mouth daily.   Blood Glucose Monitoring Suppl DEVI 1 each by Does not apply route in the morning, at noon, and at bedtime. May substitute to any manufacturer covered by patient's insurance.   busPIRone  (BUSPAR ) 5 MG tablet TAKE 1 TABLET TWICE A DAY   Cholecalciferol (VITAMIN D3) 50 MCG (2000 UT) capsule Take 2,000 Units by mouth daily.   citalopram  (CELEXA ) 20 MG tablet TAKE 1 TABLET DAILY   clonazePAM  (KLONOPIN ) 0.5 MG tablet TAKE ONE-HALF (1/2) TO ONE TABLET TWICE A DAY AS NEEDED FOR ANXIETY   cyanocobalamin (VITAMIN B12) 1000 MCG tablet Take 1,000 mcg by mouth daily.   fluticasone  (FLONASE ) 50 MCG/ACT nasal spray Place 2 sprays into both nostrils daily. (Patient taking differently: Place 2 sprays into both nostrils daily. PRN)   gabapentin  (NEURONTIN ) 300 MG capsule Take 2 capsules (600 mg total) by mouth at bedtime.   glipiZIDE  (GLUCOTROL ) 5 MG tablet TAKE 1 TABLET TWICE A DAY BEFORE MEALS   LANTUS  SOLOSTAR 100 UNIT/ML Solostar Pen INJECT 35 UNITS UNDER THE SKIN TWICE A DAY   meclizine  (ANTIVERT ) 25 MG tablet TAKE 1 TABLET THREE TIMES A DAY AS NEEDED FOR DIZZINESS   Multiple Vitamins-Minerals (CENTRUM SILVER 50+WOMEN PO) Take 1 tablet by mouth daily.   nitroGLYCERIN  (NITROSTAT ) 0.4 MG SL tablet Place 1 tablet (0.4 mg total) under the tongue every 5 (five) minutes as needed for chest pain.   Omega-3 Fatty Acids (FISH OIL) 1000 MG CAPS Take 1 capsule by mouth in the morning and at bedtime.   omeprazole  (PRILOSEC) 20 MG capsule TAKE 1 CAPSULE DAILY   Semaglutide, 1 MG/DOSE, (OZEMPIC, 1 MG/DOSE,) 4 MG/3ML SOPN Inject 1 mg into the skin once a week.   benzonatate  (TESSALON ) 100 MG capsule Take 1-2 capsules (100-200 mg total) by mouth 3 (three) times daily as needed. (Patient not taking: Reported on 11/17/2024)   No facility-administered encounter medications on file  as of 11/17/2024.    History: Past Medical History:  Diagnosis Date   Anxiety    Arthritis 2019   just found by x-ray   Cataract 2018   monitoring   Chicken pox    Depression    Diabetes mellitus without complication (HCC)    Frequent headaches    Hyperlipidemia    Hypertension 2018   fluctuates   Neuromuscular disorder (HCC) 2017   medication treatment   Past Surgical History:  Procedure Laterality Date   ABDOMINAL HYSTERECTOMY  2013   total   BREAST BIOPSY Left 2013   benign   CESAREAN SECTION     COLONOSCOPY WITH PROPOFOL  N/A 02/09/2016   Procedure: COLONOSCOPY WITH PROPOFOL ;  Surgeon: Deward CINDERELLA Piedmont, MD;  Location: [facility] ENDOSCOPY;  Service: Gastroenterology;  Laterality: N/A;   LEFT HEART CATH AND CORONARY ANGIOGRAPHY N/A 08/24/2024   Procedure: LEFT HEART CATH AND CORONARY ANGIOGRAPHY;  Surgeon: Mady Bruckner, MD;  Location: [facility] INV[facility]IVE [facility];  Service: Cardiovascular;  Laterality: N/A;   TONSILLECTOMY AND ADENOIDECTOMY     TUBAL LIGATION  1995   Family History  Problem Relation Age of Onset   Uterine cancer Mother    Breast cancer Mother 85   Pancreatic cancer Mother    Arthritis Mother    Cancer Mother    Hyperlipidemia Mother    Heart disease Father    Hypertension Father  Anxiety disorder Father    Diabetes Father    Arthritis Father    COPD Father    Depression Father    Hearing loss Father    Hyperlipidemia Father    Obesity Father    Vision loss Father    Anxiety disorder Daughter    Uterine cancer Maternal Aunt    Breast cancer Maternal Aunt 60   Cancer Maternal Aunt    Miscarriages / Stillbirths Maternal Grandmother    Varicose Veins Paternal Grandmother    Heart disease Paternal Grandfather    Diabetes Paternal Grandfather    Early death Paternal Grandfather    Hearing loss Paternal Grandfather    Social History   Occupational History   Occupation: retired  Tobacco Use   Smoking status: Every Day    Current packs/day: 1.00     Average packs/day: 1 pack/day for 44.0 years (44.0 ttl pk-yrs)    Types: Cigarettes    Start date: 1982   Smokeless tobacco: Never   Tobacco comments:    Weaning myself down and habitual activities.  Vaping Use   Vaping status: Never Used  Substance and Sexual Activity   Alcohol use: No   Drug use: No   Sexual activity: Not Currently    Partners: Male    Birth control/protection: None   Tobacco Counseling Ready to quit: Not Answered Counseling given: Not Answered Tobacco comments: Weaning myself down and habitual activities.  SDOH Screenings   Food Insecurity: No Food Insecurity (11/17/2024)  Housing: Low Risk (11/17/2024)  Transportation Needs: No Transportation Needs (11/17/2024)  Utilities: Not At Risk (11/17/2024)  Alcohol Screen: Low Risk (05/19/2023)  Depression (PHQ2-9): Low Risk (11/17/2024)  Recent Concern: Depression (PHQ2-9) - Medium Risk (09/10/2024)  Financial Resource Strain: Low Risk (07/07/2024)  Physical Activity: Inactive (11/17/2024)  Social Connections: Moderately Isolated (11/17/2024)  Stress: No Stress Concern Present (11/17/2024)  Tobacco Use: High Risk (11/17/2024)  Health Literacy: Adequate Health Literacy (11/17/2024)   See flowsheets for full screening details  Depression Screen PHQ 2 & 9 Depression Scale- Over the past 2 weeks, how often have you been bothered by any of the following problems? Little interest or pleasure in doing things: 1 Feeling down, depressed, or hopeless (PHQ Adolescent also includes...irritable): 1 PHQ-2 Total Score: 2 Trouble falling or staying asleep, or sleeping too much: 0 Feeling tired or having little energy: 0 Poor appetite or overeating (PHQ Adolescent also includes...weight loss): 0 Feeling bad about yourself - or that you are a failure or have let yourself or your family down: 0 Trouble concentrating on things, such as reading the newspaper or watching television (PHQ Adolescent also includes...like school work): 0 Moving  or speaking so slowly that other people could have noticed. Or the opposite - being so fidgety or restless that you have been moving around a lot more than usual: 0 Thoughts that you would be better off dead, or of hurting yourself in some way: 0 PHQ-9 Total Score: 2 If you checked off any problems, how difficult have these problems made it for you to do your work, take care of things at home, or get along with other people?: Not difficult at all  Depression Treatment Depression Interventions/Treatment : [id number] Score <4 Follow-up Not Indicated; Currently on Treatment     Goals Addressed               This Visit's Progress     Keep blood sugar under control (pt-stated)        COMPLETED:  Patient Stated        05/19/2023, keeping A1C under control             Objective:    Today's Vitals   11/17/24 1346  Weight: 155 lb (70.3 kg)  Height: 5' 3 (1.6 m)   Body mass index is 27.46 kg/m.  Hearing/Vision screen Hearing Screening - Comments:: Some hearing loss Vision Screening - Comments:: Needs DM eye exam. Patient to call and schedule w/ Dr. Manus Edison. Immunizations and Health Maintenance Health Maintenance  Topic Date Due   Zoster Vaccines- Shingrix (1 of 2) Never done   Colonoscopy  02/08/2021   Mammogram  09/14/2023   OPHTHALMOLOGY EXAM  06/17/2024   Lung Cancer Screening  07/08/2025 (Originally 01/14/2024)   Diabetic kidney evaluation - Urine ACR  01/05/2025   HEMOGLOBIN A1C  01/05/2025   FOOT EXAM  07/08/2025   Diabetic kidney evaluation - eGFR measurement  08/24/2025   Medicare Annual Wellness (AWV)  11/17/2025   Bone Density Scan  09/06/2026   DTaP/Tdap/Td (2 - Td or Tdap) 06/17/2027   Pneumococcal Vaccine: 50+ Years  Completed   Influenza Vaccine  Completed   Hepatitis C Screening  Completed   HIV Screening  Completed   Hepatitis B Vaccines 19-59 Average Risk  Aged Out   Meningococcal B Vaccine  Aged Out   COVID-19 Vaccine  Discontinued         Assessment/Plan:  This is a routine wellness examination for [place].  Patient Care Team: Alana Sharyle LABOR, RPH-CPP as Pharmacist Antonette Angeline ORN, NP as Nurse Practitioner (Internal Medicine) Edison Manus DASEN., MD (Ophthalmology)  I have personally reviewed and noted the following in the patients chart:   Medical and social history Use of alcohol, tobacco or illicit drugs  Current medications and supplements including opioid prescriptions. Functional ability and status Nutritional status Physical activity Advanced directives List of other physicians Hospitalizations, surgeries, and ER visits in previous 12 months Vitals Screenings to include cognitive, depression, and falls Referrals and appointments  No orders of the defined types were placed in this encounter.  In addition, I have reviewed and discussed with patient certain preventive protocols, quality metrics, and best practice recommendations. A written personalized care plan for preventive services as well as general preventive health recommendations were provided to patient.   Vina Ned, CMA   11/17/2024   Return in 53 weeks (on 11/23/2025) for Medicare Annual Wellness Visit.  After Visit Summary: (MyChart) Due to this being a telephonic visit, the after visit summary with patients personalized plan was offered to patient via MyChart   Nurse Notes:  FBS this morning was 117  per patient Needs DM eye exam. Patient to call and schedule with Dr. Manus Edison Gave ph# to call and schedule a MMG (order placed 07/08/24) Declined covid and shingles vaccines Declined colonoscopy Declined lung cancer screening Declined DM & Nutrition education referral "

## 2024-11-19 ENCOUNTER — Ambulatory Visit

## 2024-11-19 ENCOUNTER — Encounter: Payer: Self-pay | Admitting: Internal Medicine

## 2024-12-08 MED ORDER — ATORVASTATIN CALCIUM 40 MG PO TABS: 40.0000 mg | ORAL_TABLET | Freq: Every day | ORAL | 0 refills | Status: AC

## 2024-12-08 NOTE — Addendum Note (Signed)
 Addended by: ZELIA GAUZE D on: 12/08/2024 11:18 AM   Modules accepted: Orders

## 2024-12-30 ENCOUNTER — Encounter

## 2025-01-06 ENCOUNTER — Ambulatory Visit: Admitting: Internal Medicine

## 2025-04-11 ENCOUNTER — Other Ambulatory Visit

## 2025-11-23 ENCOUNTER — Ambulatory Visit
